# Patient Record
Sex: Female | Born: 1935 | Race: Black or African American | Hispanic: No | Smoking: Former smoker
Health system: Southern US, Community
[De-identification: ages and names within clinical notes are randomized; demographics above are authoritative.]

## PROBLEM LIST (undated history)

## (undated) DIAGNOSIS — K529 Noninfective gastroenteritis and colitis, unspecified: Secondary | ICD-10-CM

## (undated) DIAGNOSIS — C9030 Solitary plasmacytoma not having achieved remission: Secondary | ICD-10-CM

## (undated) DIAGNOSIS — E785 Hyperlipidemia, unspecified: Secondary | ICD-10-CM

## (undated) DIAGNOSIS — N183 Chronic kidney disease, stage 3 unspecified: Secondary | ICD-10-CM

## (undated) DIAGNOSIS — I10 Essential (primary) hypertension: Secondary | ICD-10-CM

## (undated) DIAGNOSIS — K219 Gastro-esophageal reflux disease without esophagitis: Secondary | ICD-10-CM

## (undated) DIAGNOSIS — C9000 Multiple myeloma not having achieved remission: Secondary | ICD-10-CM

## (undated) DIAGNOSIS — D472 Monoclonal gammopathy: Secondary | ICD-10-CM

## (undated) DIAGNOSIS — D649 Anemia, unspecified: Secondary | ICD-10-CM

## (undated) DIAGNOSIS — J189 Pneumonia, unspecified organism: Secondary | ICD-10-CM

## (undated) DIAGNOSIS — R350 Frequency of micturition: Secondary | ICD-10-CM

## (undated) DIAGNOSIS — M1990 Unspecified osteoarthritis, unspecified site: Secondary | ICD-10-CM

## (undated) HISTORY — DX: Monoclonal gammopathy: D47.2

## (undated) HISTORY — PX: BONE MARROW BIOPSY: SHX199

## (undated) HISTORY — DX: Multiple myeloma not having achieved remission: C90.00

## (undated) HISTORY — PX: FRACTURE SURGERY: SHX138

## (undated) HISTORY — PX: CATARACT EXTRACTION: SUR2

## (undated) HISTORY — PX: VAGINAL HYSTERECTOMY: SUR661

---

## 1997-12-30 ENCOUNTER — Other Ambulatory Visit: Admission: RE | Admit: 1997-12-30 | Discharge: 1997-12-30 | Payer: Self-pay | Admitting: Family Medicine

## 1998-01-02 ENCOUNTER — Ambulatory Visit (HOSPITAL_COMMUNITY): Admission: RE | Admit: 1998-01-02 | Discharge: 1998-01-02 | Payer: Self-pay | Admitting: Family Medicine

## 1998-01-27 ENCOUNTER — Ambulatory Visit (HOSPITAL_COMMUNITY): Admission: RE | Admit: 1998-01-27 | Discharge: 1998-01-27 | Payer: Self-pay | Admitting: Family Medicine

## 1998-10-24 ENCOUNTER — Ambulatory Visit (HOSPITAL_COMMUNITY): Admission: RE | Admit: 1998-10-24 | Discharge: 1998-10-24 | Payer: Self-pay | Admitting: Family Medicine

## 1998-10-24 ENCOUNTER — Encounter: Payer: Self-pay | Admitting: Family Medicine

## 1999-06-02 ENCOUNTER — Ambulatory Visit (HOSPITAL_COMMUNITY): Admission: RE | Admit: 1999-06-02 | Discharge: 1999-06-02 | Payer: Self-pay | Admitting: Family Medicine

## 1999-06-05 ENCOUNTER — Ambulatory Visit (HOSPITAL_COMMUNITY): Admission: RE | Admit: 1999-06-05 | Discharge: 1999-06-05 | Payer: Self-pay | Admitting: Family Medicine

## 1999-06-05 ENCOUNTER — Encounter: Payer: Self-pay | Admitting: Family Medicine

## 2000-05-14 ENCOUNTER — Encounter: Payer: Self-pay | Admitting: Emergency Medicine

## 2000-05-14 ENCOUNTER — Emergency Department (HOSPITAL_COMMUNITY): Admission: EM | Admit: 2000-05-14 | Discharge: 2000-05-14 | Payer: Self-pay | Admitting: Emergency Medicine

## 2000-12-19 ENCOUNTER — Other Ambulatory Visit: Admission: RE | Admit: 2000-12-19 | Discharge: 2000-12-19 | Payer: Self-pay | Admitting: Family Medicine

## 2000-12-23 ENCOUNTER — Ambulatory Visit (HOSPITAL_COMMUNITY): Admission: RE | Admit: 2000-12-23 | Discharge: 2000-12-23 | Payer: Self-pay | Admitting: Family Medicine

## 2000-12-23 ENCOUNTER — Encounter: Payer: Self-pay | Admitting: Family Medicine

## 2001-03-08 ENCOUNTER — Encounter: Payer: Self-pay | Admitting: Family Medicine

## 2001-03-08 ENCOUNTER — Ambulatory Visit (HOSPITAL_COMMUNITY): Admission: RE | Admit: 2001-03-08 | Discharge: 2001-03-09 | Payer: Self-pay | Admitting: Family Medicine

## 2001-03-09 ENCOUNTER — Encounter: Payer: Self-pay | Admitting: Family Medicine

## 2001-05-04 ENCOUNTER — Encounter: Payer: Self-pay | Admitting: Family Medicine

## 2001-05-04 ENCOUNTER — Ambulatory Visit (HOSPITAL_COMMUNITY): Admission: RE | Admit: 2001-05-04 | Discharge: 2001-05-04 | Payer: Self-pay | Admitting: Family Medicine

## 2002-07-04 ENCOUNTER — Emergency Department (HOSPITAL_COMMUNITY): Admission: EM | Admit: 2002-07-04 | Discharge: 2002-07-05 | Payer: Self-pay | Admitting: Emergency Medicine

## 2002-07-05 ENCOUNTER — Encounter: Payer: Self-pay | Admitting: *Deleted

## 2003-12-13 ENCOUNTER — Emergency Department (HOSPITAL_COMMUNITY): Admission: EM | Admit: 2003-12-13 | Discharge: 2003-12-14 | Payer: Self-pay | Admitting: Emergency Medicine

## 2003-12-15 ENCOUNTER — Emergency Department (HOSPITAL_COMMUNITY): Admission: EM | Admit: 2003-12-15 | Discharge: 2003-12-15 | Payer: Self-pay | Admitting: Emergency Medicine

## 2004-02-19 ENCOUNTER — Encounter (INDEPENDENT_AMBULATORY_CARE_PROVIDER_SITE_OTHER): Payer: Self-pay | Admitting: Cardiology

## 2004-02-19 ENCOUNTER — Inpatient Hospital Stay (HOSPITAL_COMMUNITY): Admission: EM | Admit: 2004-02-19 | Discharge: 2004-02-21 | Payer: Self-pay | Admitting: Emergency Medicine

## 2004-04-07 ENCOUNTER — Ambulatory Visit: Payer: Self-pay | Admitting: *Deleted

## 2004-04-22 ENCOUNTER — Ambulatory Visit: Payer: Self-pay | Admitting: Family Medicine

## 2004-04-23 ENCOUNTER — Ambulatory Visit: Payer: Self-pay | Admitting: Family Medicine

## 2004-08-14 ENCOUNTER — Ambulatory Visit: Payer: Self-pay | Admitting: Family Medicine

## 2004-09-21 ENCOUNTER — Ambulatory Visit: Payer: Self-pay | Admitting: Family Medicine

## 2004-09-24 ENCOUNTER — Ambulatory Visit: Payer: Self-pay | Admitting: Family Medicine

## 2005-03-29 ENCOUNTER — Emergency Department (HOSPITAL_COMMUNITY): Admission: EM | Admit: 2005-03-29 | Discharge: 2005-03-29 | Payer: Self-pay | Admitting: Family Medicine

## 2005-05-08 ENCOUNTER — Emergency Department (HOSPITAL_COMMUNITY): Admission: EM | Admit: 2005-05-08 | Discharge: 2005-05-08 | Payer: Self-pay | Admitting: Family Medicine

## 2005-06-11 ENCOUNTER — Emergency Department (HOSPITAL_COMMUNITY): Admission: EM | Admit: 2005-06-11 | Discharge: 2005-06-11 | Payer: Self-pay | Admitting: Family Medicine

## 2005-08-06 ENCOUNTER — Ambulatory Visit: Payer: Self-pay | Admitting: Internal Medicine

## 2005-09-06 ENCOUNTER — Ambulatory Visit: Payer: Self-pay | Admitting: Hospitalist

## 2005-10-01 ENCOUNTER — Ambulatory Visit: Payer: Self-pay | Admitting: Internal Medicine

## 2006-01-04 ENCOUNTER — Ambulatory Visit: Payer: Self-pay | Admitting: Cardiovascular Disease

## 2006-01-04 ENCOUNTER — Inpatient Hospital Stay (HOSPITAL_COMMUNITY): Admission: EM | Admit: 2006-01-04 | Discharge: 2006-01-06 | Payer: Self-pay | Admitting: Family Medicine

## 2006-01-05 ENCOUNTER — Encounter: Payer: Self-pay | Admitting: Cardiovascular Disease

## 2006-01-06 ENCOUNTER — Ambulatory Visit: Payer: Self-pay | Admitting: Internal Medicine

## 2006-01-25 ENCOUNTER — Ambulatory Visit: Payer: Self-pay | Admitting: Internal Medicine

## 2006-01-25 ENCOUNTER — Encounter (INDEPENDENT_AMBULATORY_CARE_PROVIDER_SITE_OTHER): Payer: Self-pay | Admitting: *Deleted

## 2006-01-30 LAB — FECAL OCCULT BLOOD, GUAIAC: Fecal Occult Blood: NEGATIVE

## 2006-02-08 ENCOUNTER — Ambulatory Visit: Payer: Self-pay | Admitting: Internal Medicine

## 2006-05-03 ENCOUNTER — Encounter (INDEPENDENT_AMBULATORY_CARE_PROVIDER_SITE_OTHER): Payer: Self-pay | Admitting: *Deleted

## 2006-05-04 DIAGNOSIS — K219 Gastro-esophageal reflux disease without esophagitis: Secondary | ICD-10-CM

## 2006-05-04 DIAGNOSIS — I10 Essential (primary) hypertension: Secondary | ICD-10-CM | POA: Insufficient documentation

## 2006-05-04 DIAGNOSIS — Z87891 Personal history of nicotine dependence: Secondary | ICD-10-CM

## 2006-06-07 DIAGNOSIS — J449 Chronic obstructive pulmonary disease, unspecified: Secondary | ICD-10-CM

## 2006-06-07 DIAGNOSIS — J4489 Other specified chronic obstructive pulmonary disease: Secondary | ICD-10-CM | POA: Insufficient documentation

## 2006-11-07 ENCOUNTER — Telehealth (INDEPENDENT_AMBULATORY_CARE_PROVIDER_SITE_OTHER): Payer: Self-pay | Admitting: *Deleted

## 2006-11-07 DIAGNOSIS — D7289 Other specified disorders of white blood cells: Secondary | ICD-10-CM

## 2006-11-21 ENCOUNTER — Ambulatory Visit (HOSPITAL_COMMUNITY): Admission: RE | Admit: 2006-11-21 | Discharge: 2006-11-21 | Payer: Self-pay | Admitting: Internal Medicine

## 2006-11-21 ENCOUNTER — Ambulatory Visit: Payer: Self-pay | Admitting: Internal Medicine

## 2006-11-21 ENCOUNTER — Encounter (INDEPENDENT_AMBULATORY_CARE_PROVIDER_SITE_OTHER): Payer: Self-pay | Admitting: *Deleted

## 2006-11-21 LAB — CONVERTED CEMR LAB
BUN: 18 mg/dL (ref 6–23)
Chloride: 97 meq/L (ref 96–112)
Glucose, Bld: 102 mg/dL — ABNORMAL HIGH (ref 70–99)
Ketones, ur: NEGATIVE mg/dL
LDL Cholesterol: 100 mg/dL — ABNORMAL HIGH (ref 0–99)
Nitrite: NEGATIVE
Potassium: 4.6 meq/L (ref 3.5–5.3)
Protein, ur: NEGATIVE mg/dL
Sodium: 134 meq/L — ABNORMAL LOW (ref 135–145)
Specific Gravity, Urine: 1.01 (ref 1.005–1.03)
Total CHOL/HDL Ratio: 2.8
Triglycerides: 89 mg/dL (ref <150)
Urobilinogen, UA: 0.2 (ref 0.0–1.0)
VLDL: 18 mg/dL (ref 0–40)

## 2006-11-28 ENCOUNTER — Telehealth (INDEPENDENT_AMBULATORY_CARE_PROVIDER_SITE_OTHER): Payer: Self-pay | Admitting: *Deleted

## 2007-02-06 ENCOUNTER — Telehealth (INDEPENDENT_AMBULATORY_CARE_PROVIDER_SITE_OTHER): Payer: Self-pay | Admitting: *Deleted

## 2007-03-21 ENCOUNTER — Encounter (INDEPENDENT_AMBULATORY_CARE_PROVIDER_SITE_OTHER): Payer: Self-pay | Admitting: *Deleted

## 2007-03-21 ENCOUNTER — Ambulatory Visit: Payer: Self-pay | Admitting: Hospitalist

## 2007-03-21 ENCOUNTER — Telehealth: Payer: Self-pay | Admitting: *Deleted

## 2007-03-21 LAB — CONVERTED CEMR LAB
BUN: 17 mg/dL (ref 6–23)
CO2: 23 meq/L (ref 19–32)
Glucose, Bld: 92 mg/dL (ref 70–99)
Potassium: 4.6 meq/L (ref 3.5–5.3)
Sodium: 133 meq/L — ABNORMAL LOW (ref 135–145)

## 2007-07-16 ENCOUNTER — Emergency Department (HOSPITAL_COMMUNITY): Admission: EM | Admit: 2007-07-16 | Discharge: 2007-07-16 | Payer: Self-pay | Admitting: Family Medicine

## 2007-07-20 ENCOUNTER — Encounter (INDEPENDENT_AMBULATORY_CARE_PROVIDER_SITE_OTHER): Payer: Self-pay | Admitting: *Deleted

## 2007-07-20 ENCOUNTER — Encounter (INDEPENDENT_AMBULATORY_CARE_PROVIDER_SITE_OTHER): Payer: Self-pay | Admitting: Infectious Diseases

## 2007-07-20 ENCOUNTER — Ambulatory Visit: Payer: Self-pay | Admitting: Internal Medicine

## 2007-07-20 DIAGNOSIS — M21619 Bunion of unspecified foot: Secondary | ICD-10-CM

## 2007-07-20 LAB — CONVERTED CEMR LAB
BUN: 16 mg/dL (ref 6–23)
CO2: 26 meq/L (ref 19–32)
Calcium: 9.8 mg/dL (ref 8.4–10.5)
Creatinine, Ser: 0.9 mg/dL (ref 0.40–1.20)
Glucose, Bld: 96 mg/dL (ref 70–99)
Sodium: 142 meq/L (ref 135–145)

## 2007-07-25 ENCOUNTER — Encounter (INDEPENDENT_AMBULATORY_CARE_PROVIDER_SITE_OTHER): Payer: Self-pay | Admitting: *Deleted

## 2007-07-28 ENCOUNTER — Ambulatory Visit: Payer: Self-pay | Admitting: Vascular Surgery

## 2007-07-28 ENCOUNTER — Encounter: Admission: RE | Admit: 2007-07-28 | Discharge: 2007-07-28 | Payer: Self-pay | Admitting: Gastroenterology

## 2007-08-17 ENCOUNTER — Encounter (INDEPENDENT_AMBULATORY_CARE_PROVIDER_SITE_OTHER): Payer: Self-pay | Admitting: *Deleted

## 2007-11-25 ENCOUNTER — Ambulatory Visit: Payer: Self-pay | Admitting: *Deleted

## 2007-11-26 ENCOUNTER — Ambulatory Visit: Payer: Self-pay | Admitting: Surgery

## 2007-11-26 ENCOUNTER — Inpatient Hospital Stay (HOSPITAL_COMMUNITY): Admission: EM | Admit: 2007-11-26 | Discharge: 2007-11-30 | Payer: Self-pay | Admitting: Emergency Medicine

## 2007-11-26 ENCOUNTER — Encounter (INDEPENDENT_AMBULATORY_CARE_PROVIDER_SITE_OTHER): Payer: Self-pay | Admitting: Hospitalist

## 2007-11-26 ENCOUNTER — Ambulatory Visit: Payer: Self-pay | Admitting: Hospitalist

## 2007-11-26 ENCOUNTER — Encounter (INDEPENDENT_AMBULATORY_CARE_PROVIDER_SITE_OTHER): Payer: Self-pay | Admitting: Internal Medicine

## 2007-11-27 ENCOUNTER — Encounter: Payer: Self-pay | Admitting: Cardiovascular Disease

## 2007-12-07 ENCOUNTER — Encounter (INDEPENDENT_AMBULATORY_CARE_PROVIDER_SITE_OTHER): Payer: Self-pay | Admitting: Internal Medicine

## 2007-12-07 ENCOUNTER — Ambulatory Visit (HOSPITAL_COMMUNITY): Admission: RE | Admit: 2007-12-07 | Discharge: 2007-12-07 | Payer: Self-pay | Admitting: Internal Medicine

## 2007-12-07 ENCOUNTER — Ambulatory Visit: Payer: Self-pay | Admitting: Internal Medicine

## 2007-12-07 DIAGNOSIS — I498 Other specified cardiac arrhythmias: Secondary | ICD-10-CM

## 2007-12-14 ENCOUNTER — Ambulatory Visit: Payer: Self-pay | Admitting: Internal Medicine

## 2007-12-14 DIAGNOSIS — G4700 Insomnia, unspecified: Secondary | ICD-10-CM

## 2007-12-31 ENCOUNTER — Encounter (INDEPENDENT_AMBULATORY_CARE_PROVIDER_SITE_OTHER): Payer: Self-pay | Admitting: Internal Medicine

## 2007-12-31 ENCOUNTER — Inpatient Hospital Stay (HOSPITAL_COMMUNITY): Admission: EM | Admit: 2007-12-31 | Discharge: 2008-01-01 | Payer: Self-pay | Admitting: Emergency Medicine

## 2007-12-31 ENCOUNTER — Ambulatory Visit: Payer: Self-pay | Admitting: Internal Medicine

## 2008-01-02 ENCOUNTER — Telehealth (INDEPENDENT_AMBULATORY_CARE_PROVIDER_SITE_OTHER): Payer: Self-pay | Admitting: Internal Medicine

## 2008-01-10 ENCOUNTER — Ambulatory Visit: Payer: Self-pay | Admitting: Infectious Diseases

## 2008-01-10 ENCOUNTER — Encounter (INDEPENDENT_AMBULATORY_CARE_PROVIDER_SITE_OTHER): Payer: Self-pay | Admitting: Internal Medicine

## 2008-01-15 ENCOUNTER — Encounter (INDEPENDENT_AMBULATORY_CARE_PROVIDER_SITE_OTHER): Payer: Self-pay | Admitting: Internal Medicine

## 2008-01-15 LAB — CONVERTED CEMR LAB
BUN: 12 mg/dL (ref 6–23)
Calcium: 9.5 mg/dL (ref 8.4–10.5)
Chloride: 93 meq/L — ABNORMAL LOW (ref 96–112)
Creatinine, Ser: 0.93 mg/dL (ref 0.40–1.20)
HCT: 35.8 % — ABNORMAL LOW (ref 36.0–46.0)
Hemoglobin: 12 g/dL (ref 12.0–15.0)
MCV: 90.6 fL (ref 78.0–100.0)
Platelets: 272 10*3/uL (ref 150–400)
RDW: 13.2 % (ref 11.5–15.5)

## 2008-01-18 ENCOUNTER — Ambulatory Visit: Payer: Self-pay | Admitting: Internal Medicine

## 2008-01-18 ENCOUNTER — Encounter (INDEPENDENT_AMBULATORY_CARE_PROVIDER_SITE_OTHER): Payer: Self-pay | Admitting: Internal Medicine

## 2008-01-18 LAB — CONVERTED CEMR LAB
BUN: 16 mg/dL (ref 6–23)
Chloride: 97 meq/L (ref 96–112)
Creatinine, Ser: 0.94 mg/dL (ref 0.40–1.20)
Potassium: 4.2 meq/L (ref 3.5–5.3)

## 2008-01-20 HISTORY — PX: FEMUR SURGERY: SHX943

## 2008-01-25 ENCOUNTER — Telehealth: Payer: Self-pay | Admitting: *Deleted

## 2008-01-25 ENCOUNTER — Encounter (INDEPENDENT_AMBULATORY_CARE_PROVIDER_SITE_OTHER): Payer: Self-pay | Admitting: Internal Medicine

## 2008-02-09 ENCOUNTER — Ambulatory Visit: Payer: Self-pay | Admitting: Cardiology

## 2008-02-09 ENCOUNTER — Observation Stay (HOSPITAL_COMMUNITY): Admission: AD | Admit: 2008-02-09 | Discharge: 2008-02-10 | Payer: Self-pay | Admitting: Infectious Diseases

## 2008-02-09 ENCOUNTER — Ambulatory Visit (HOSPITAL_COMMUNITY): Admission: RE | Admit: 2008-02-09 | Discharge: 2008-02-09 | Payer: Self-pay | Admitting: Internal Medicine

## 2008-02-09 ENCOUNTER — Encounter (INDEPENDENT_AMBULATORY_CARE_PROVIDER_SITE_OTHER): Payer: Self-pay | Admitting: Internal Medicine

## 2008-02-09 ENCOUNTER — Ambulatory Visit: Payer: Self-pay | Admitting: Internal Medicine

## 2008-02-10 ENCOUNTER — Ambulatory Visit: Payer: Self-pay | Admitting: Infectious Diseases

## 2008-02-29 ENCOUNTER — Ambulatory Visit: Payer: Self-pay | Admitting: Cardiology

## 2008-03-06 ENCOUNTER — Telehealth (INDEPENDENT_AMBULATORY_CARE_PROVIDER_SITE_OTHER): Payer: Self-pay | Admitting: Internal Medicine

## 2008-03-14 ENCOUNTER — Ambulatory Visit: Payer: Self-pay | Admitting: Internal Medicine

## 2008-03-20 ENCOUNTER — Ambulatory Visit (HOSPITAL_COMMUNITY): Admission: RE | Admit: 2008-03-20 | Discharge: 2008-03-20 | Payer: Self-pay | Admitting: *Deleted

## 2008-05-08 ENCOUNTER — Ambulatory Visit: Payer: Self-pay | Admitting: Cardiology

## 2008-05-29 ENCOUNTER — Encounter (INDEPENDENT_AMBULATORY_CARE_PROVIDER_SITE_OTHER): Payer: Self-pay | Admitting: Internal Medicine

## 2008-05-29 ENCOUNTER — Ambulatory Visit: Payer: Self-pay | Admitting: Internal Medicine

## 2008-05-29 DIAGNOSIS — E785 Hyperlipidemia, unspecified: Secondary | ICD-10-CM

## 2008-05-29 LAB — CONVERTED CEMR LAB
Calcium: 9.9 mg/dL (ref 8.4–10.5)
Creatinine, Ser: 0.93 mg/dL (ref 0.40–1.20)
Sodium: 143 meq/L (ref 135–145)

## 2008-05-31 ENCOUNTER — Ambulatory Visit (HOSPITAL_COMMUNITY): Admission: RE | Admit: 2008-05-31 | Discharge: 2008-05-31 | Payer: Self-pay | Admitting: Internal Medicine

## 2008-05-31 ENCOUNTER — Encounter (INDEPENDENT_AMBULATORY_CARE_PROVIDER_SITE_OTHER): Payer: Self-pay | Admitting: Internal Medicine

## 2008-06-19 ENCOUNTER — Encounter (INDEPENDENT_AMBULATORY_CARE_PROVIDER_SITE_OTHER): Payer: Self-pay | Admitting: *Deleted

## 2008-06-19 ENCOUNTER — Ambulatory Visit: Payer: Self-pay | Admitting: Infectious Diseases

## 2008-06-19 LAB — CONVERTED CEMR LAB
CO2: 25 meq/L (ref 19–32)
Chloride: 107 meq/L (ref 96–112)
Potassium: 4.6 meq/L (ref 3.5–5.3)
Sodium: 144 meq/L (ref 135–145)

## 2008-06-28 ENCOUNTER — Ambulatory Visit: Payer: Self-pay

## 2008-06-28 ENCOUNTER — Encounter: Payer: Self-pay | Admitting: Cardiology

## 2008-07-01 ENCOUNTER — Telehealth (INDEPENDENT_AMBULATORY_CARE_PROVIDER_SITE_OTHER): Payer: Self-pay | Admitting: Internal Medicine

## 2008-07-09 ENCOUNTER — Encounter: Payer: Self-pay | Admitting: Licensed Clinical Social Worker

## 2008-07-09 ENCOUNTER — Encounter (INDEPENDENT_AMBULATORY_CARE_PROVIDER_SITE_OTHER): Payer: Self-pay | Admitting: Internal Medicine

## 2008-07-09 ENCOUNTER — Ambulatory Visit: Payer: Self-pay | Admitting: Infectious Disease

## 2008-07-09 DIAGNOSIS — F329 Major depressive disorder, single episode, unspecified: Secondary | ICD-10-CM

## 2008-07-12 ENCOUNTER — Encounter (INDEPENDENT_AMBULATORY_CARE_PROVIDER_SITE_OTHER): Payer: Self-pay | Admitting: Internal Medicine

## 2008-07-12 ENCOUNTER — Telehealth: Payer: Self-pay | Admitting: Licensed Clinical Social Worker

## 2008-07-17 DIAGNOSIS — R748 Abnormal levels of other serum enzymes: Secondary | ICD-10-CM | POA: Insufficient documentation

## 2008-07-17 DIAGNOSIS — R7309 Other abnormal glucose: Secondary | ICD-10-CM

## 2008-07-17 LAB — CONVERTED CEMR LAB
Albumin: 4.4 g/dL (ref 3.5–5.2)
Alkaline Phosphatase: 125 units/L — ABNORMAL HIGH (ref 39–117)
CO2: 24 meq/L (ref 19–32)
Calcium: 9.9 mg/dL (ref 8.4–10.5)
Chloride: 105 meq/L (ref 96–112)
Cholesterol: 154 mg/dL (ref 0–200)
Glucose, Bld: 109 mg/dL — ABNORMAL HIGH (ref 70–99)
LDL Cholesterol: 70 mg/dL (ref 0–99)
Potassium: 4.4 meq/L (ref 3.5–5.3)
Sodium: 143 meq/L (ref 135–145)
Total Protein: 7.2 g/dL (ref 6.0–8.3)
Triglycerides: 87 mg/dL (ref <150)

## 2008-09-19 ENCOUNTER — Ambulatory Visit: Payer: Self-pay | Admitting: Internal Medicine

## 2008-10-24 ENCOUNTER — Ambulatory Visit: Payer: Self-pay | Admitting: Cardiology

## 2008-10-24 DIAGNOSIS — R609 Edema, unspecified: Secondary | ICD-10-CM | POA: Insufficient documentation

## 2008-10-24 DIAGNOSIS — I2109 ST elevation (STEMI) myocardial infarction involving other coronary artery of anterior wall: Secondary | ICD-10-CM

## 2008-10-28 ENCOUNTER — Telehealth: Payer: Self-pay | Admitting: Cardiology

## 2009-01-24 ENCOUNTER — Encounter: Payer: Self-pay | Admitting: Internal Medicine

## 2009-01-24 ENCOUNTER — Inpatient Hospital Stay (HOSPITAL_COMMUNITY): Admission: EM | Admit: 2009-01-24 | Discharge: 2009-01-27 | Payer: Self-pay | Admitting: Emergency Medicine

## 2009-01-24 ENCOUNTER — Emergency Department (HOSPITAL_COMMUNITY): Admission: EM | Admit: 2009-01-24 | Discharge: 2009-01-24 | Payer: Self-pay | Admitting: Family Medicine

## 2009-01-24 ENCOUNTER — Ambulatory Visit: Payer: Self-pay | Admitting: Infectious Disease

## 2009-01-27 ENCOUNTER — Telehealth (INDEPENDENT_AMBULATORY_CARE_PROVIDER_SITE_OTHER): Payer: Self-pay | Admitting: Internal Medicine

## 2009-02-11 ENCOUNTER — Ambulatory Visit: Payer: Self-pay | Admitting: Oncology

## 2009-02-14 ENCOUNTER — Encounter (INDEPENDENT_AMBULATORY_CARE_PROVIDER_SITE_OTHER): Payer: Self-pay | Admitting: Internal Medicine

## 2009-02-20 ENCOUNTER — Encounter (INDEPENDENT_AMBULATORY_CARE_PROVIDER_SITE_OTHER): Payer: Self-pay | Admitting: Internal Medicine

## 2009-03-11 ENCOUNTER — Encounter (INDEPENDENT_AMBULATORY_CARE_PROVIDER_SITE_OTHER): Payer: Self-pay | Admitting: Internal Medicine

## 2009-03-11 ENCOUNTER — Encounter: Payer: Self-pay | Admitting: Cardiology

## 2009-03-11 LAB — CBC WITH DIFFERENTIAL/PLATELET
Basophils Absolute: 0 10*3/uL (ref 0.0–0.1)
EOS%: 0.7 % (ref 0.0–7.0)
Eosinophils Absolute: 0.1 10*3/uL (ref 0.0–0.5)
LYMPH%: 14.6 % (ref 14.0–49.7)
MCH: 31.4 pg (ref 25.1–34.0)
MCV: 91.6 fL (ref 79.5–101.0)
MONO%: 5.1 % (ref 0.0–14.0)
NEUT#: 6.4 10*3/uL (ref 1.5–6.5)
Platelets: 232 10*3/uL (ref 145–400)
RBC: 3.87 10*6/uL (ref 3.70–5.45)

## 2009-03-13 ENCOUNTER — Ambulatory Visit (HOSPITAL_COMMUNITY): Admission: RE | Admit: 2009-03-13 | Discharge: 2009-03-13 | Payer: Self-pay | Admitting: Oncology

## 2009-03-13 ENCOUNTER — Ambulatory Visit: Payer: Self-pay | Admitting: Oncology

## 2009-03-14 LAB — COMPREHENSIVE METABOLIC PANEL
ALT: 10 U/L (ref 0–35)
Albumin: 4.3 g/dL (ref 3.5–5.2)
Alkaline Phosphatase: 70 U/L (ref 39–117)
CO2: 21 mEq/L (ref 19–32)
Glucose, Bld: 78 mg/dL (ref 70–99)
Potassium: 4.4 mEq/L (ref 3.5–5.3)
Sodium: 133 mEq/L — ABNORMAL LOW (ref 135–145)
Total Bilirubin: 0.7 mg/dL (ref 0.3–1.2)
Total Protein: 6.7 g/dL (ref 6.0–8.3)

## 2009-03-14 LAB — SPEP & IFE WITH QIG
Albumin ELP: 60 % (ref 55.8–66.1)
IgA: 113 mg/dL (ref 68–378)
IgG (Immunoglobin G), Serum: 733 mg/dL (ref 694–1618)
M-Spike, %: 0.35 g/dL
Total Protein, Serum Electrophoresis: 6.7 g/dL (ref 6.0–8.3)

## 2009-03-14 LAB — KAPPA/LAMBDA LIGHT CHAINS
Kappa free light chain: 12.2 mg/dL — ABNORMAL HIGH (ref 0.33–1.94)
Lambda Free Lght Chn: 1.22 mg/dL (ref 0.57–2.63)

## 2009-03-17 LAB — UIFE/LIGHT CHAINS/TP QN, 24-HR UR
Free Kappa Lt Chains,Ur: 1.48 mg/dL (ref 0.04–1.51)
Free Kappa/Lambda Ratio: 24.67 ratio — ABNORMAL HIGH (ref 0.46–4.00)
Free Lambda Excretion/Day: 0.83 mg/d
Free Lambda Lt Chains,Ur: 0.06 mg/dL (ref 0.08–1.01)
Free Lt Chn Excr Rate: 20.35 mg/d
Gamma Globulin, Urine: DETECTED — AB
Time: 24 hours
Total Protein, Urine: 2 mg/dL
Volume, Urine: 1375 mL

## 2009-03-20 ENCOUNTER — Ambulatory Visit: Admission: RE | Admit: 2009-03-20 | Discharge: 2009-05-22 | Payer: Self-pay | Admitting: Radiation Oncology

## 2009-03-21 ENCOUNTER — Emergency Department (HOSPITAL_COMMUNITY): Admission: EM | Admit: 2009-03-21 | Discharge: 2009-03-21 | Payer: Self-pay | Admitting: Emergency Medicine

## 2009-03-27 ENCOUNTER — Ambulatory Visit: Payer: Self-pay | Admitting: Internal Medicine

## 2009-03-27 ENCOUNTER — Ambulatory Visit: Payer: Self-pay | Admitting: Infectious Diseases

## 2009-03-27 ENCOUNTER — Telehealth (INDEPENDENT_AMBULATORY_CARE_PROVIDER_SITE_OTHER): Payer: Self-pay | Admitting: Internal Medicine

## 2009-03-27 ENCOUNTER — Observation Stay (HOSPITAL_COMMUNITY): Admission: AD | Admit: 2009-03-27 | Discharge: 2009-03-29 | Payer: Self-pay | Admitting: Internal Medicine

## 2009-03-27 ENCOUNTER — Ambulatory Visit (HOSPITAL_COMMUNITY): Admission: RE | Admit: 2009-03-27 | Discharge: 2009-03-27 | Payer: Self-pay | Admitting: Internal Medicine

## 2009-03-27 ENCOUNTER — Encounter (INDEPENDENT_AMBULATORY_CARE_PROVIDER_SITE_OTHER): Payer: Self-pay | Admitting: Internal Medicine

## 2009-03-27 DIAGNOSIS — M25559 Pain in unspecified hip: Secondary | ICD-10-CM | POA: Insufficient documentation

## 2009-03-27 DIAGNOSIS — C9000 Multiple myeloma not having achieved remission: Secondary | ICD-10-CM

## 2009-03-29 ENCOUNTER — Encounter: Payer: Self-pay | Admitting: Internal Medicine

## 2009-03-31 ENCOUNTER — Ambulatory Visit: Payer: Self-pay | Admitting: Internal Medicine

## 2009-04-07 ENCOUNTER — Encounter: Payer: Self-pay | Admitting: Oncology

## 2009-04-07 ENCOUNTER — Ambulatory Visit (HOSPITAL_COMMUNITY): Admission: RE | Admit: 2009-04-07 | Discharge: 2009-04-07 | Payer: Self-pay | Admitting: Oncology

## 2009-04-07 ENCOUNTER — Ambulatory Visit: Payer: Self-pay | Admitting: Oncology

## 2009-04-11 ENCOUNTER — Encounter: Payer: Self-pay | Admitting: Cardiology

## 2009-04-14 ENCOUNTER — Encounter: Payer: Self-pay | Admitting: Cardiology

## 2009-05-07 ENCOUNTER — Ambulatory Visit: Payer: Self-pay | Admitting: Oncology

## 2009-05-07 ENCOUNTER — Encounter: Payer: Self-pay | Admitting: Cardiology

## 2009-05-07 LAB — COMPREHENSIVE METABOLIC PANEL
AST: 18 U/L (ref 0–37)
Alkaline Phosphatase: 69 U/L (ref 39–117)
Glucose, Bld: 112 mg/dL — ABNORMAL HIGH (ref 70–99)
Sodium: 136 mEq/L (ref 135–145)
Total Bilirubin: 0.6 mg/dL (ref 0.3–1.2)
Total Protein: 6.6 g/dL (ref 6.0–8.3)

## 2009-05-07 LAB — CBC WITH DIFFERENTIAL/PLATELET
BASO%: 0.5 % (ref 0.0–2.0)
EOS%: 1.2 % (ref 0.0–7.0)
Eosinophils Absolute: 0.1 10*3/uL (ref 0.0–0.5)
LYMPH%: 21.3 % (ref 14.0–49.7)
MCH: 32 pg (ref 25.1–34.0)
MCHC: 33.7 g/dL (ref 31.5–36.0)
MCV: 94.9 fL (ref 79.5–101.0)
MONO%: 5.9 % (ref 0.0–14.0)
Platelets: 211 10*3/uL (ref 145–400)
RBC: 3.64 10*6/uL — ABNORMAL LOW (ref 3.70–5.45)
RDW: 13.6 % (ref 11.2–14.5)

## 2009-05-19 ENCOUNTER — Encounter: Payer: Self-pay | Admitting: Cardiology

## 2009-06-18 ENCOUNTER — Encounter (INDEPENDENT_AMBULATORY_CARE_PROVIDER_SITE_OTHER): Payer: Self-pay | Admitting: Internal Medicine

## 2009-07-02 ENCOUNTER — Ambulatory Visit: Payer: Self-pay | Admitting: Oncology

## 2009-07-04 ENCOUNTER — Encounter (INDEPENDENT_AMBULATORY_CARE_PROVIDER_SITE_OTHER): Payer: Self-pay | Admitting: Internal Medicine

## 2009-07-04 ENCOUNTER — Encounter: Payer: Self-pay | Admitting: Cardiology

## 2009-07-04 LAB — CBC WITH DIFFERENTIAL/PLATELET
EOS%: 1.5 % (ref 0.0–7.0)
MCH: 31.2 pg (ref 25.1–34.0)
MCV: 95.2 fL (ref 79.5–101.0)
MONO%: 7.1 % (ref 0.0–14.0)
RBC: 3.72 10*6/uL (ref 3.70–5.45)
RDW: 12.5 % (ref 11.2–14.5)

## 2009-07-04 LAB — COMPREHENSIVE METABOLIC PANEL
AST: 28 U/L (ref 0–37)
Albumin: 3.9 g/dL (ref 3.5–5.2)
Alkaline Phosphatase: 87 U/L (ref 39–117)
BUN: 13 mg/dL (ref 6–23)
Potassium: 3.4 mEq/L — ABNORMAL LOW (ref 3.5–5.3)
Sodium: 139 mEq/L (ref 135–145)
Total Protein: 6.6 g/dL (ref 6.0–8.3)

## 2009-07-08 LAB — KAPPA/LAMBDA LIGHT CHAINS
Kappa free light chain: 12.4 mg/dL — ABNORMAL HIGH (ref 0.33–1.94)
Lambda Free Lght Chn: 0.92 mg/dL (ref 0.57–2.63)

## 2009-07-08 LAB — IMMUNOFIXATION ELECTROPHORESIS
IgA: 117 mg/dL (ref 68–378)
IgM, Serum: 30 mg/dL — ABNORMAL LOW (ref 60–263)

## 2009-07-14 ENCOUNTER — Telehealth (INDEPENDENT_AMBULATORY_CARE_PROVIDER_SITE_OTHER): Payer: Self-pay | Admitting: Internal Medicine

## 2009-07-17 ENCOUNTER — Ambulatory Visit (HOSPITAL_COMMUNITY): Admission: RE | Admit: 2009-07-17 | Discharge: 2009-07-17 | Payer: Self-pay | Admitting: Oncology

## 2009-08-05 ENCOUNTER — Ambulatory Visit: Payer: Self-pay | Admitting: Oncology

## 2009-08-07 ENCOUNTER — Encounter (INDEPENDENT_AMBULATORY_CARE_PROVIDER_SITE_OTHER): Payer: Self-pay | Admitting: Internal Medicine

## 2009-08-07 ENCOUNTER — Encounter: Payer: Self-pay | Admitting: Cardiology

## 2009-08-07 LAB — CBC WITH DIFFERENTIAL/PLATELET
Basophils Absolute: 0 10*3/uL (ref 0.0–0.1)
EOS%: 3.3 % (ref 0.0–7.0)
Eosinophils Absolute: 0.2 10*3/uL (ref 0.0–0.5)
HGB: 10.6 g/dL — ABNORMAL LOW (ref 11.6–15.9)
MCH: 31.1 pg (ref 25.1–34.0)
MONO#: 0.4 10*3/uL (ref 0.1–0.9)
NEUT#: 3.5 10*3/uL (ref 1.5–6.5)
RDW: 12.5 % (ref 11.2–14.5)
WBC: 5.1 10*3/uL (ref 3.9–10.3)
lymph#: 1 10*3/uL (ref 0.9–3.3)

## 2009-08-07 LAB — COMPREHENSIVE METABOLIC PANEL
AST: 23 U/L (ref 0–37)
Albumin: 3.6 g/dL (ref 3.5–5.2)
BUN: 14 mg/dL (ref 6–23)
CO2: 28 mEq/L (ref 19–32)
Calcium: 8.4 mg/dL (ref 8.4–10.5)
Chloride: 107 mEq/L (ref 96–112)
Creatinine, Ser: 0.86 mg/dL (ref 0.40–1.20)
Glucose, Bld: 89 mg/dL (ref 70–99)

## 2009-09-02 ENCOUNTER — Encounter (INDEPENDENT_AMBULATORY_CARE_PROVIDER_SITE_OTHER): Payer: Self-pay | Admitting: Internal Medicine

## 2009-09-02 ENCOUNTER — Encounter: Payer: Self-pay | Admitting: Cardiology

## 2009-09-02 LAB — CBC WITH DIFFERENTIAL/PLATELET
BASO%: 0.6 % (ref 0.0–2.0)
Eosinophils Absolute: 0.1 10*3/uL (ref 0.0–0.5)
LYMPH%: 21.1 % (ref 14.0–49.7)
MCHC: 34 g/dL (ref 31.5–36.0)
MONO#: 0.4 10*3/uL (ref 0.1–0.9)
MONO%: 8.9 % (ref 0.0–14.0)
NEUT#: 3 10*3/uL (ref 1.5–6.5)
Platelets: 204 10*3/uL (ref 145–400)
RBC: 3.61 10*6/uL — ABNORMAL LOW (ref 3.70–5.45)
RDW: 13.1 % (ref 11.2–14.5)
WBC: 4.5 10*3/uL (ref 3.9–10.3)

## 2009-09-02 LAB — COMPREHENSIVE METABOLIC PANEL
ALT: 23 U/L (ref 0–35)
Albumin: 3.5 g/dL (ref 3.5–5.2)
Alkaline Phosphatase: 120 U/L — ABNORMAL HIGH (ref 39–117)
CO2: 28 mEq/L (ref 19–32)
Potassium: 3.8 mEq/L (ref 3.5–5.3)
Sodium: 144 mEq/L (ref 135–145)
Total Bilirubin: 0.7 mg/dL (ref 0.3–1.2)
Total Protein: 6.3 g/dL (ref 6.0–8.3)

## 2009-09-03 ENCOUNTER — Encounter (INDEPENDENT_AMBULATORY_CARE_PROVIDER_SITE_OTHER): Payer: Self-pay | Admitting: Internal Medicine

## 2009-09-03 ENCOUNTER — Ambulatory Visit: Payer: Self-pay | Admitting: Infectious Diseases

## 2009-09-03 LAB — CONVERTED CEMR LAB
AST: 19 units/L (ref 0–37)
Albumin: 3.9 g/dL (ref 3.5–5.2)
BUN: 18 mg/dL (ref 6–23)
CO2: 26 meq/L (ref 19–32)
Calcium: 9 mg/dL (ref 8.4–10.5)
Chloride: 106 meq/L (ref 96–112)
HDL: 54 mg/dL (ref >39)
Potassium: 4.7 meq/L (ref 3.5–5.3)

## 2009-09-05 LAB — SPEP & IFE WITH QIG
Albumin ELP: 57.7 % (ref 55.8–66.1)
Alpha-1-Globulin: 6.6 % — ABNORMAL HIGH (ref 2.9–4.9)
IgA: 117 mg/dL (ref 68–378)
IgM, Serum: 28 mg/dL — ABNORMAL LOW (ref 60–263)
Total Protein, Serum Electrophoresis: 6.4 g/dL (ref 6.0–8.3)

## 2009-09-22 ENCOUNTER — Ambulatory Visit (HOSPITAL_COMMUNITY): Admission: RE | Admit: 2009-09-22 | Discharge: 2009-09-22 | Payer: Self-pay | Admitting: Internal Medicine

## 2009-09-22 LAB — HM MAMMOGRAPHY: HM Mammogram: NEGATIVE

## 2009-09-30 ENCOUNTER — Ambulatory Visit: Payer: Self-pay | Admitting: Oncology

## 2009-10-02 ENCOUNTER — Encounter (INDEPENDENT_AMBULATORY_CARE_PROVIDER_SITE_OTHER): Payer: Self-pay | Admitting: Internal Medicine

## 2009-10-02 LAB — CBC WITH DIFFERENTIAL/PLATELET
BASO%: 0.3 % (ref 0.0–2.0)
EOS%: 4.2 % (ref 0.0–7.0)
LYMPH%: 28.3 % (ref 14.0–49.7)
MCH: 30.3 pg (ref 25.1–34.0)
MCHC: 32.6 g/dL (ref 31.5–36.0)
MONO#: 0.3 10*3/uL (ref 0.1–0.9)
MONO%: 7.9 % (ref 0.0–14.0)
NEUT%: 59.3 % (ref 38.4–76.8)
Platelets: 164 10*3/uL (ref 145–400)
RBC: 3.76 10*6/uL (ref 3.70–5.45)
WBC: 3.8 10*3/uL — ABNORMAL LOW (ref 3.9–10.3)
nRBC: 0 % (ref 0–0)

## 2009-10-07 LAB — SPEP & IFE WITH QIG
Alpha-2-Globulin: 12 % — ABNORMAL HIGH (ref 7.1–11.8)
Gamma Globulin: 13.6 % (ref 11.1–18.8)
IgA: 111 mg/dL (ref 68–378)
IgG (Immunoglobin G), Serum: 753 mg/dL (ref 694–1618)
IgM, Serum: 27 mg/dL — ABNORMAL LOW (ref 60–263)
M-Spike, %: 0.33 g/dL
Total Protein, Serum Electrophoresis: 6.2 g/dL (ref 6.0–8.3)

## 2009-10-07 LAB — COMPREHENSIVE METABOLIC PANEL
ALT: 18 U/L (ref 0–35)
AST: 16 U/L (ref 0–37)
Alkaline Phosphatase: 105 U/L (ref 39–117)
BUN: 13 mg/dL (ref 6–23)
Chloride: 106 mEq/L (ref 96–112)
Creatinine, Ser: 0.93 mg/dL (ref 0.40–1.20)
Total Bilirubin: 0.8 mg/dL (ref 0.3–1.2)

## 2009-10-07 LAB — KAPPA/LAMBDA LIGHT CHAINS: Kappa:Lambda Ratio: 30.66 — ABNORMAL HIGH (ref 0.26–1.65)

## 2009-11-06 ENCOUNTER — Ambulatory Visit: Payer: Self-pay | Admitting: Oncology

## 2009-11-12 ENCOUNTER — Encounter (INDEPENDENT_AMBULATORY_CARE_PROVIDER_SITE_OTHER): Payer: Self-pay | Admitting: Internal Medicine

## 2009-11-12 LAB — COMPREHENSIVE METABOLIC PANEL
AST: 16 U/L (ref 0–37)
Alkaline Phosphatase: 104 U/L (ref 39–117)
CO2: 27 mEq/L (ref 19–32)
Chloride: 108 mEq/L (ref 96–112)
Creatinine, Ser: 0.97 mg/dL (ref 0.40–1.20)
Total Bilirubin: 1 mg/dL (ref 0.3–1.2)

## 2009-11-12 LAB — CBC WITH DIFFERENTIAL/PLATELET
BASO%: 1.1 % (ref 0.0–2.0)
HCT: 32.9 % — ABNORMAL LOW (ref 34.8–46.6)
HGB: 11.2 g/dL — ABNORMAL LOW (ref 11.6–15.9)
MCHC: 34 g/dL (ref 31.5–36.0)
MONO#: 0.2 10*3/uL (ref 0.1–0.9)
NEUT%: 66.4 % (ref 38.4–76.8)
WBC: 3.7 10*3/uL — ABNORMAL LOW (ref 3.9–10.3)
lymph#: 0.7 10*3/uL — ABNORMAL LOW (ref 0.9–3.3)

## 2009-11-14 LAB — KAPPA/LAMBDA LIGHT CHAINS: Lambda Free Lght Chn: 0.53 mg/dL — ABNORMAL LOW (ref 0.57–2.63)

## 2009-11-14 LAB — SPEP & IFE WITH QIG
Alpha-2-Globulin: 15 % — ABNORMAL HIGH (ref 7.1–11.8)
Beta 2: 5.8 % (ref 3.2–6.5)
Gamma Globulin: 13.2 % (ref 11.1–18.8)
IgG (Immunoglobin G), Serum: 751 mg/dL (ref 694–1618)
IgM, Serum: 27 mg/dL — ABNORMAL LOW (ref 60–263)

## 2009-12-09 ENCOUNTER — Telehealth: Payer: Self-pay | Admitting: Internal Medicine

## 2009-12-15 ENCOUNTER — Telehealth: Payer: Self-pay | Admitting: Internal Medicine

## 2009-12-23 ENCOUNTER — Ambulatory Visit: Payer: Self-pay | Admitting: Oncology

## 2009-12-30 ENCOUNTER — Ambulatory Visit (HOSPITAL_COMMUNITY): Admission: RE | Admit: 2009-12-30 | Discharge: 2009-12-30 | Payer: Self-pay | Admitting: Oncology

## 2010-01-01 ENCOUNTER — Encounter: Payer: Self-pay | Admitting: Internal Medicine

## 2010-01-01 LAB — CBC WITH DIFFERENTIAL/PLATELET
EOS%: 5.1 % (ref 0.0–7.0)
Eosinophils Absolute: 0.2 10*3/uL (ref 0.0–0.5)
MCH: 30.6 pg (ref 25.1–34.0)
MCV: 94.6 fL (ref 79.5–101.0)
MONO%: 8.1 % (ref 0.0–14.0)
NEUT#: 1.8 10*3/uL (ref 1.5–6.5)
RBC: 3.66 10*6/uL — ABNORMAL LOW (ref 3.70–5.45)
RDW: 15.6 % — ABNORMAL HIGH (ref 11.2–14.5)
lymph#: 0.9 10*3/uL (ref 0.9–3.3)

## 2010-01-05 LAB — COMPREHENSIVE METABOLIC PANEL
ALT: 18 U/L (ref 0–35)
AST: 15 U/L (ref 0–37)
Albumin: 4.1 g/dL (ref 3.5–5.2)
Alkaline Phosphatase: 89 U/L (ref 39–117)
Potassium: 4 mEq/L (ref 3.5–5.3)
Sodium: 141 mEq/L (ref 135–145)
Total Protein: 6.3 g/dL (ref 6.0–8.3)

## 2010-01-05 LAB — SPEP & IFE WITH QIG
Albumin ELP: 60.1 % (ref 55.8–66.1)
Alpha-1-Globulin: 5.6 % — ABNORMAL HIGH (ref 2.9–4.9)
Gamma Globulin: 13.1 % (ref 11.1–18.8)
IgM, Serum: 25 mg/dL — ABNORMAL LOW (ref 60–263)
Total Protein, Serum Electrophoresis: 6.3 g/dL (ref 6.0–8.3)

## 2010-01-13 ENCOUNTER — Ambulatory Visit: Payer: Self-pay | Admitting: Internal Medicine

## 2010-01-13 DIAGNOSIS — R197 Diarrhea, unspecified: Secondary | ICD-10-CM

## 2010-02-16 ENCOUNTER — Ambulatory Visit: Payer: Self-pay | Admitting: Oncology

## 2010-02-16 ENCOUNTER — Encounter: Payer: Self-pay | Admitting: Emergency Medicine

## 2010-02-16 ENCOUNTER — Encounter (INDEPENDENT_AMBULATORY_CARE_PROVIDER_SITE_OTHER): Payer: Self-pay | Admitting: Internal Medicine

## 2010-02-16 ENCOUNTER — Inpatient Hospital Stay (HOSPITAL_COMMUNITY): Admission: EM | Admit: 2010-02-16 | Discharge: 2010-02-19 | Payer: Self-pay | Admitting: Internal Medicine

## 2010-02-16 ENCOUNTER — Ambulatory Visit: Payer: Self-pay | Admitting: Cardiovascular Disease

## 2010-02-16 ENCOUNTER — Ambulatory Visit: Payer: Self-pay | Admitting: Internal Medicine

## 2010-02-17 ENCOUNTER — Encounter: Payer: Self-pay | Admitting: Internal Medicine

## 2010-02-18 ENCOUNTER — Encounter (INDEPENDENT_AMBULATORY_CARE_PROVIDER_SITE_OTHER): Payer: Self-pay | Admitting: Gastroenterology

## 2010-02-19 ENCOUNTER — Encounter: Payer: Self-pay | Admitting: Internal Medicine

## 2010-02-19 DIAGNOSIS — R1033 Periumbilical pain: Secondary | ICD-10-CM | POA: Insufficient documentation

## 2010-02-27 ENCOUNTER — Ambulatory Visit: Payer: Self-pay | Admitting: Oncology

## 2010-03-06 ENCOUNTER — Encounter: Payer: Self-pay | Admitting: Internal Medicine

## 2010-03-06 LAB — CBC WITH DIFFERENTIAL/PLATELET
BASO%: 0.3 % (ref 0.0–2.0)
EOS%: 3.1 % (ref 0.0–7.0)
HCT: 33.6 % — ABNORMAL LOW (ref 34.8–46.6)
LYMPH%: 20.8 % (ref 14.0–49.7)
MCH: 32.2 pg (ref 25.1–34.0)
MCHC: 33.5 g/dL (ref 31.5–36.0)
MCV: 96.2 fL (ref 79.5–101.0)
MONO%: 4.9 % (ref 0.0–14.0)
NEUT%: 70.9 % (ref 38.4–76.8)
Platelets: 151 10*3/uL (ref 145–400)
RBC: 3.5 10*6/uL — ABNORMAL LOW (ref 3.70–5.45)
WBC: 4.9 10*3/uL (ref 3.9–10.3)

## 2010-03-10 LAB — SPEP & IFE WITH QIG
Beta Globulin: 6.3 % (ref 4.7–7.2)
IgA: 136 mg/dL (ref 68–378)
IgG (Immunoglobin G), Serum: 843 mg/dL (ref 694–1618)
IgM, Serum: 28 mg/dL — ABNORMAL LOW (ref 60–263)
M-Spike, %: 0.21 g/dL
Total Protein, Serum Electrophoresis: 6.4 g/dL (ref 6.0–8.3)

## 2010-03-10 LAB — COMPREHENSIVE METABOLIC PANEL
ALT: 19 U/L (ref 0–35)
AST: 18 U/L (ref 0–37)
Alkaline Phosphatase: 109 U/L (ref 39–117)
CO2: 25 mEq/L (ref 19–32)
Creatinine, Ser: 0.82 mg/dL (ref 0.40–1.20)
Sodium: 144 mEq/L (ref 135–145)
Total Bilirubin: 0.7 mg/dL (ref 0.3–1.2)
Total Protein: 6.4 g/dL (ref 6.0–8.3)

## 2010-03-10 LAB — KAPPA/LAMBDA LIGHT CHAINS: Kappa free light chain: 12.4 mg/dL — ABNORMAL HIGH (ref 0.33–1.94)

## 2010-04-07 ENCOUNTER — Ambulatory Visit: Payer: Self-pay | Admitting: Oncology

## 2010-04-17 ENCOUNTER — Telehealth: Payer: Self-pay | Admitting: Internal Medicine

## 2010-04-20 ENCOUNTER — Encounter: Payer: Self-pay | Admitting: Internal Medicine

## 2010-04-20 LAB — CBC WITH DIFFERENTIAL/PLATELET
Basophils Absolute: 0 10*3/uL (ref 0.0–0.1)
EOS%: 6.5 % (ref 0.0–7.0)
HGB: 11 g/dL — ABNORMAL LOW (ref 11.6–15.9)
LYMPH%: 34.5 % (ref 14.0–49.7)
MCH: 31.2 pg (ref 25.1–34.0)
MCV: 95.2 fL (ref 79.5–101.0)
MONO%: 8.6 % (ref 0.0–14.0)
RDW: 13.3 % (ref 11.2–14.5)

## 2010-04-20 IMAGING — CT CT NECK W/ CM
4 of 6 series · 14 of 33 positions shown, 16 images · IV contrast (omnipaque)
Comparison: Soft tissue neck 03/27/2009.

CLINICAL DATA: Shortness of breath.  Wheezing and stridor.

CT NECK WITH CONTRAST
TECHNIQUE: Multidetector CT imaging of the neck was performed with
intravenous contrast.
Contrast: 75 ml Omnipaque 300

[Series 3: st neck 2.0 b31s · axial · 0.39mm/px · z∈[-194,-60]mm · 4 of 109 slices shown, 5 images]
[im 22/109  soft-tissue]
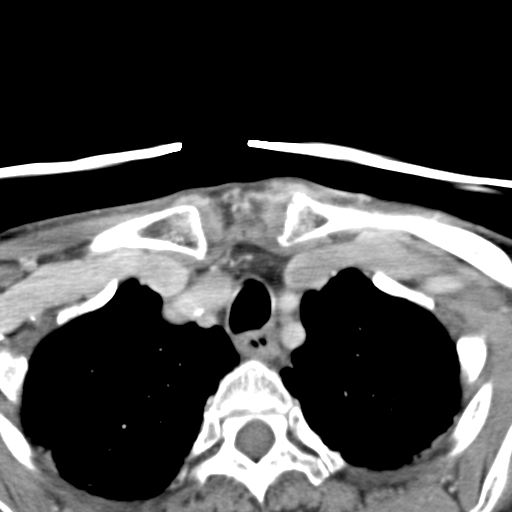
[im 22/109  bone]
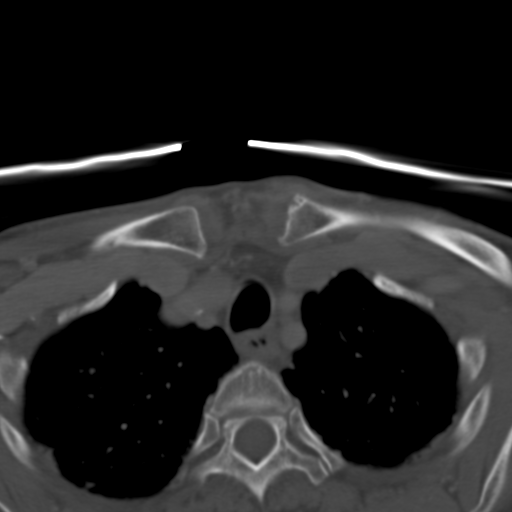
[im 44/109  bone]
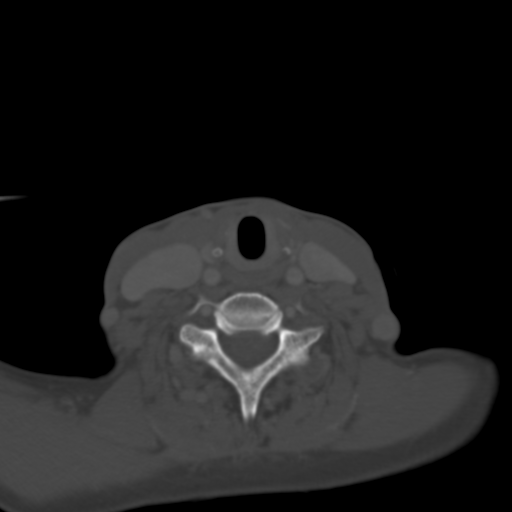
[im 65/109  bone]
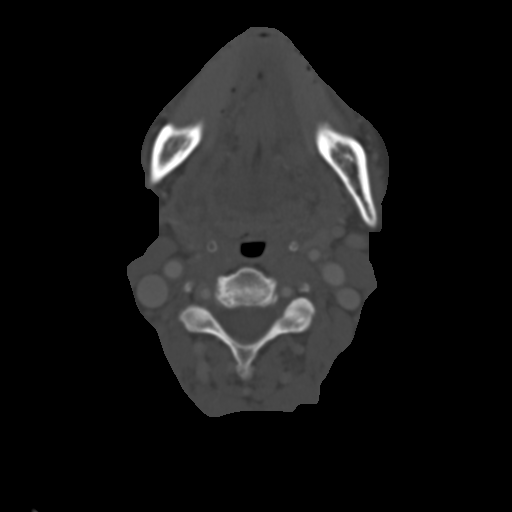
[im 87/109  bone]
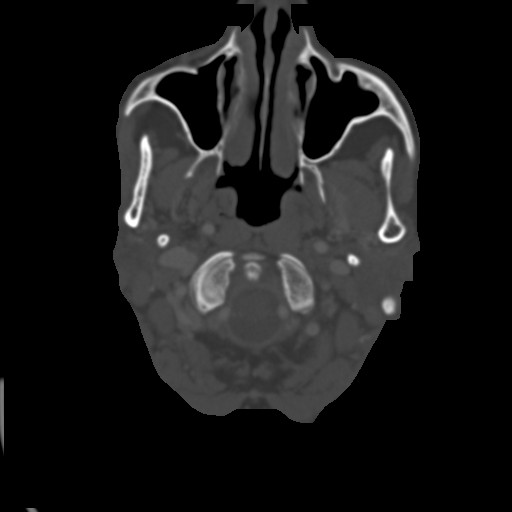

[Series 602: sagittal st · sagittal · 0.43mm/px · 5 of 48 slices shown, 6 images]
[im 16/48  bone]
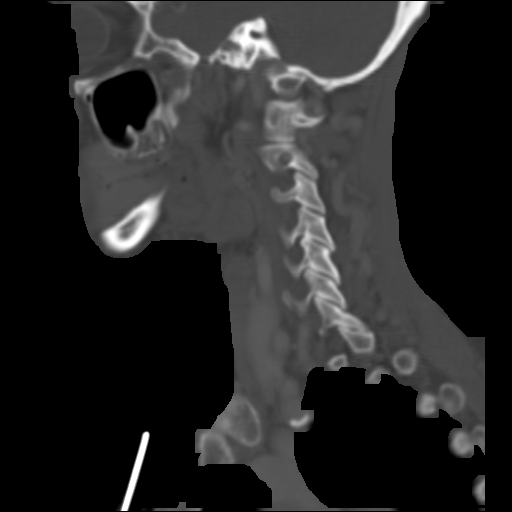
[im 20/48  bone]
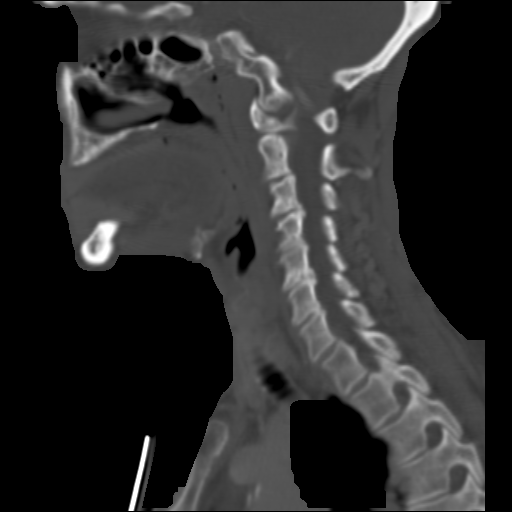
[im 24/48  soft-tissue]
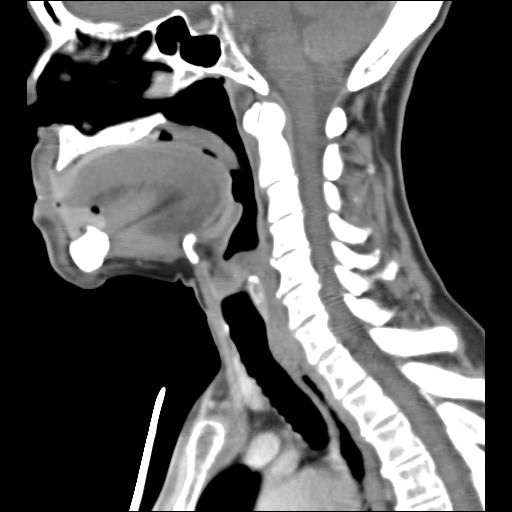
[im 24/48  bone]
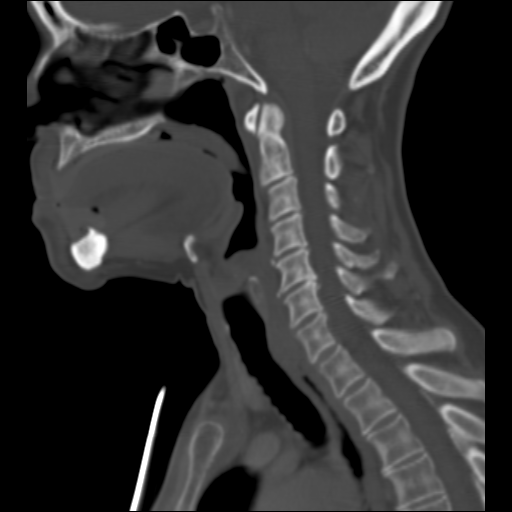
[im 28/48  bone]
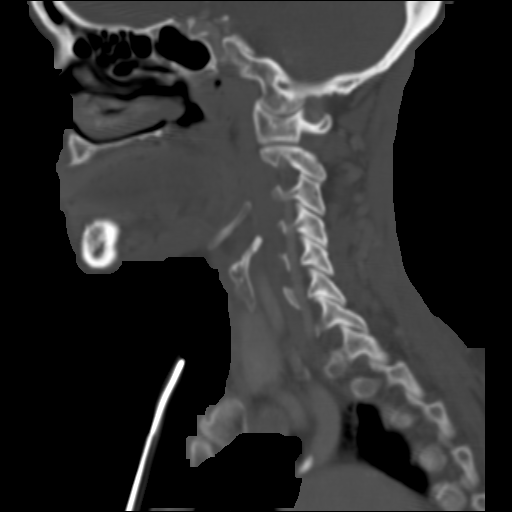
[im 32/48  bone]
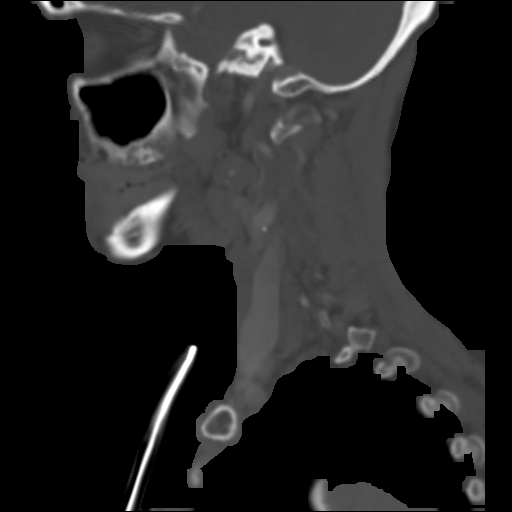

[Series 603: coronal st · coronal · 0.43mm/px · 3 of 54 slices shown]
[im 14/54  bone]
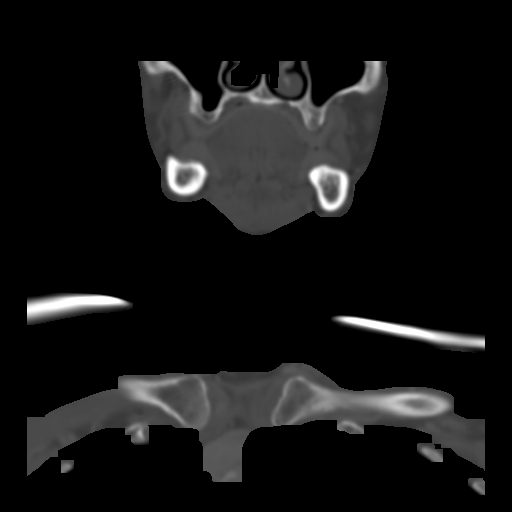
[im 23/54  bone]
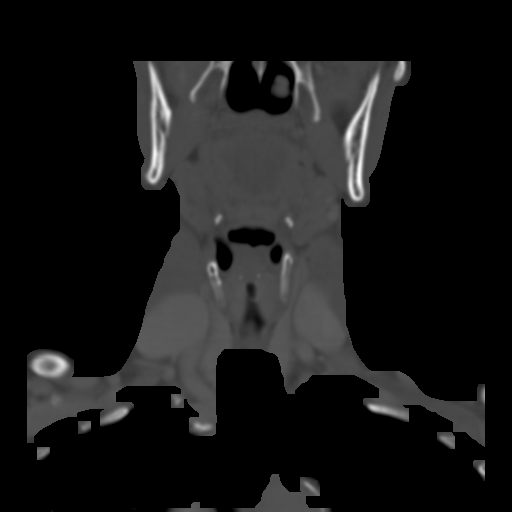
[im 32/54  bone]
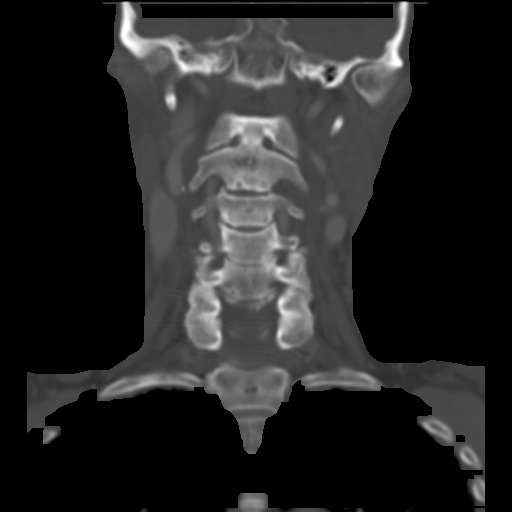

[Series 604: axial st · axial · 0.43mm/px · z∈[-212,-141]mm · 2 of 71 slices shown]
[im 24/71  bone]
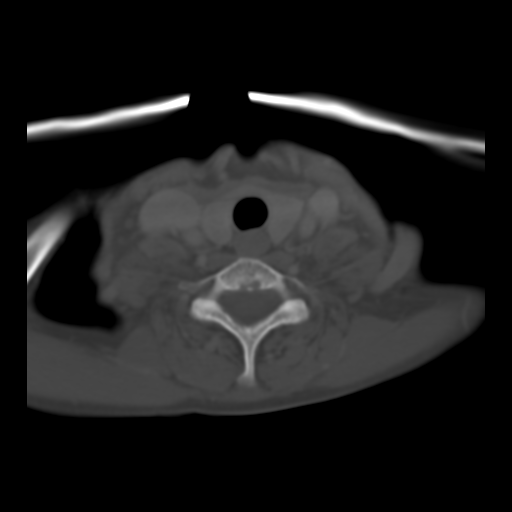
[im 47/71  bone]
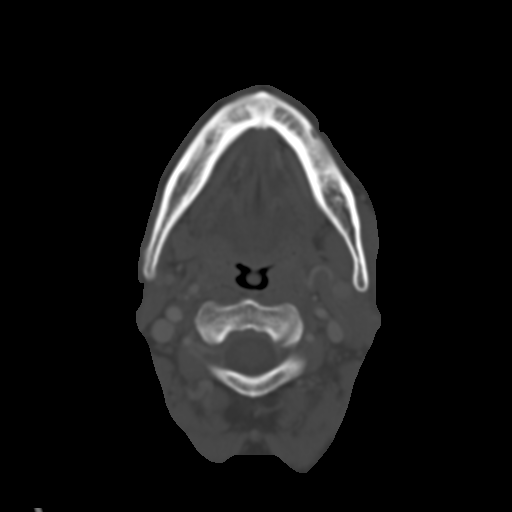

[14 of 33 positions shown; findings below may reference images not displayed]

FINDINGS: No focal mucosal or submucosal lesion is present.  The
vocal cords are midline.  The larynx is closed.  The airway is
otherwise patent.  Limited imaging of the brain is unremarkable.

There is no significant cervical adenopathy.  The thyroid gland is
within normal limits.

There is mild pleural scarring at the lung apices bilaterally.
Atherosclerotic calcifications are noted at the aortic arch.  Bone
windows demonstrate to mild spondylosis at C4-5, C5-6, and C6-7, C5-
6 is the worst level.
IMPRESSION: 1.  No focal lesion or significant inflammation to explain the
patient's symptoms.
2.  No significant cervical adenopathy.
3.  Bilateral apical pleural scarring is likely chronic.  The
4.  Atherosclerosis.

## 2010-04-22 LAB — SPEP & IFE WITH QIG
Beta 2: 3.6 % (ref 3.2–6.5)
IgA: 136 mg/dL (ref 68–378)
IgG (Immunoglobin G), Serum: 796 mg/dL (ref 694–1618)
IgM, Serum: 21 mg/dL — ABNORMAL LOW (ref 60–263)

## 2010-04-22 LAB — COMPREHENSIVE METABOLIC PANEL
AST: 16 U/L (ref 0–37)
Albumin: 3.8 g/dL (ref 3.5–5.2)
Alkaline Phosphatase: 113 U/L (ref 39–117)
BUN: 16 mg/dL (ref 6–23)
Creatinine, Ser: 0.82 mg/dL (ref 0.40–1.20)
Potassium: 3.6 mEq/L (ref 3.5–5.3)
Total Bilirubin: 0.6 mg/dL (ref 0.3–1.2)

## 2010-04-22 LAB — KAPPA/LAMBDA LIGHT CHAINS: Kappa:Lambda Ratio: 8.73 — ABNORMAL HIGH (ref 0.26–1.65)

## 2010-05-01 ENCOUNTER — Ambulatory Visit (HOSPITAL_COMMUNITY): Admission: RE | Admit: 2010-05-01 | Discharge: 2010-05-01 | Payer: Self-pay | Admitting: Internal Medicine

## 2010-05-01 ENCOUNTER — Ambulatory Visit: Payer: Self-pay | Admitting: Internal Medicine

## 2010-05-01 ENCOUNTER — Encounter: Payer: Self-pay | Admitting: Internal Medicine

## 2010-05-01 DIAGNOSIS — I499 Cardiac arrhythmia, unspecified: Secondary | ICD-10-CM | POA: Insufficient documentation

## 2010-05-04 LAB — CONVERTED CEMR LAB
CO2: 25 meq/L (ref 19–32)
Calcium: 9.3 mg/dL (ref 8.4–10.5)
Glucose, Bld: 95 mg/dL (ref 70–99)
Potassium: 4.6 meq/L (ref 3.5–5.3)
Sodium: 141 meq/L (ref 135–145)

## 2010-05-25 ENCOUNTER — Ambulatory Visit: Payer: Self-pay | Admitting: Oncology

## 2010-05-26 ENCOUNTER — Encounter: Payer: Self-pay | Admitting: Internal Medicine

## 2010-05-26 ENCOUNTER — Encounter: Payer: Self-pay | Admitting: Cardiology

## 2010-05-26 LAB — CBC WITH DIFFERENTIAL/PLATELET
Basophils Absolute: 0 10*3/uL (ref 0.0–0.1)
Eosinophils Absolute: 0.2 10*3/uL (ref 0.0–0.5)
HCT: 31.9 % — ABNORMAL LOW (ref 34.8–46.6)
HGB: 10.9 g/dL — ABNORMAL LOW (ref 11.6–15.9)
MONO#: 0.3 10*3/uL (ref 0.1–0.9)
NEUT#: 1.9 10*3/uL (ref 1.5–6.5)
NEUT%: 58.7 % (ref 38.4–76.8)
RDW: 14.4 % (ref 11.2–14.5)
WBC: 3.3 10*3/uL — ABNORMAL LOW (ref 3.9–10.3)
lymph#: 0.9 10*3/uL (ref 0.9–3.3)

## 2010-05-26 LAB — COMPREHENSIVE METABOLIC PANEL
AST: 16 U/L (ref 0–37)
Albumin: 3.5 g/dL (ref 3.5–5.2)
BUN: 12 mg/dL (ref 6–23)
CO2: 28 mEq/L (ref 19–32)
Calcium: 9.5 mg/dL (ref 8.4–10.5)
Chloride: 108 mEq/L (ref 96–112)
Creatinine, Ser: 1.05 mg/dL (ref 0.40–1.20)
Potassium: 4 mEq/L (ref 3.5–5.3)

## 2010-05-28 LAB — SPEP & IFE WITH QIG
Alpha-1-Globulin: 5.9 % — ABNORMAL HIGH (ref 2.9–4.9)
Alpha-2-Globulin: 11.7 % (ref 7.1–11.8)
Beta Globulin: 5.8 % (ref 4.7–7.2)
Gamma Globulin: 12.1 % (ref 11.1–18.8)
IgG (Immunoglobin G), Serum: 750 mg/dL (ref 694–1618)
Total Protein, Serum Electrophoresis: 6 g/dL (ref 6.0–8.3)

## 2010-05-28 LAB — KAPPA/LAMBDA LIGHT CHAINS
Kappa free light chain: 39.1 mg/dL — ABNORMAL HIGH (ref 0.33–1.94)
Kappa:Lambda Ratio: 23.27 — ABNORMAL HIGH (ref 0.26–1.65)

## 2010-06-10 ENCOUNTER — Telehealth: Payer: Self-pay | Admitting: Internal Medicine

## 2010-06-16 ENCOUNTER — Ambulatory Visit: Payer: Self-pay | Admitting: Internal Medicine

## 2010-07-08 ENCOUNTER — Ambulatory Visit: Payer: Self-pay | Admitting: Oncology

## 2010-07-10 ENCOUNTER — Encounter: Payer: Self-pay | Admitting: Cardiology

## 2010-07-10 ENCOUNTER — Ambulatory Visit (HOSPITAL_COMMUNITY)
Admission: RE | Admit: 2010-07-10 | Discharge: 2010-07-10 | Payer: Self-pay | Source: Home / Self Care | Attending: Oncology | Admitting: Oncology

## 2010-07-10 LAB — CBC WITH DIFFERENTIAL/PLATELET
BASO%: 0.4 % (ref 0.0–2.0)
Basophils Absolute: 0 10*3/uL (ref 0.0–0.1)
EOS%: 2.4 % (ref 0.0–7.0)
Eosinophils Absolute: 0.1 10*3/uL (ref 0.0–0.5)
HCT: 31.8 % — ABNORMAL LOW (ref 34.8–46.6)
HGB: 10.7 g/dL — ABNORMAL LOW (ref 11.6–15.9)
LYMPH%: 24.8 % (ref 14.0–49.7)
MCH: 31.8 pg (ref 25.1–34.0)
MCHC: 33.7 g/dL (ref 31.5–36.0)
MCV: 94.5 fL (ref 79.5–101.0)
MONO#: 0.4 10*3/uL (ref 0.1–0.9)
MONO%: 7.3 % (ref 0.0–14.0)
NEUT#: 3.3 10*3/uL (ref 1.5–6.5)
NEUT%: 65.1 % (ref 38.4–76.8)
Platelets: 240 10*3/uL (ref 145–400)
RBC: 3.36 10*6/uL — ABNORMAL LOW (ref 3.70–5.45)
RDW: 13.9 % (ref 11.2–14.5)
WBC: 5.1 10*3/uL (ref 3.9–10.3)
lymph#: 1.3 10*3/uL (ref 0.9–3.3)

## 2010-07-10 LAB — COMPREHENSIVE METABOLIC PANEL
ALT: 18 U/L (ref 0–35)
AST: 19 U/L (ref 0–37)
Albumin: 3.4 g/dL — ABNORMAL LOW (ref 3.5–5.2)
Alkaline Phosphatase: 112 U/L (ref 39–117)
BUN: 13 mg/dL (ref 6–23)
CO2: 24 mEq/L (ref 19–32)
Calcium: 8.4 mg/dL (ref 8.4–10.5)
Chloride: 105 mEq/L (ref 96–112)
Creatinine, Ser: 0.85 mg/dL (ref 0.40–1.20)
Glucose, Bld: 100 mg/dL — ABNORMAL HIGH (ref 70–99)
Potassium: 3.4 mEq/L — ABNORMAL LOW (ref 3.5–5.3)
Sodium: 142 mEq/L (ref 135–145)
Total Bilirubin: 0.7 mg/dL (ref 0.3–1.2)
Total Protein: 6 g/dL (ref 6.0–8.3)

## 2010-07-12 ENCOUNTER — Encounter: Payer: Self-pay | Admitting: Oncology

## 2010-07-19 LAB — CONVERTED CEMR LAB
Basophils Absolute: 0 10*3/uL (ref 0.0–0.1)
CO2: 24 meq/L (ref 19–32)
Calcium: 9.1 mg/dL (ref 8.4–10.5)
Chloride: 106 meq/L (ref 96–112)
Chloride: 98 meq/L (ref 96–112)
Glucose, Bld: 93 mg/dL (ref 70–99)
HCT: 36.6 % (ref 36.0–46.0)
Lymphocytes Relative: 30 % (ref 12–46)
Lymphs Abs: 2.3 10*3/uL (ref 0.7–4.0)
Neutro Abs: 5 10*3/uL (ref 1.7–7.7)
Neutrophils Relative %: 64 % (ref 43–77)
Platelets: 270 10*3/uL (ref 150–400)
Potassium: 3.6 meq/L (ref 3.5–5.3)
Potassium: 5.2 meq/L (ref 3.5–5.3)
Pro B Natriuretic peptide (BNP): 131 pg/mL — ABNORMAL HIGH (ref 0.0–100.0)
RDW: 12.9 % (ref 11.5–15.5)
Relative Index: 0.9 (ref 0.0–2.5)
Sodium: 133 meq/L — ABNORMAL LOW (ref 135–145)
Sodium: 140 meq/L (ref 135–145)
Total CK: 133 units/L (ref 7–177)
Troponin I: 0.02 ng/mL (ref <0.06)
WBC: 7.8 10*3/uL (ref 4.0–10.5)

## 2010-07-23 NOTE — Assessment & Plan Note (Signed)
Summary: EST-CK/FU/MEDS/CFB   Vital Signs:  Patient profile:   77 year old female Height:      65 inches Weight:      136.2 pounds BMI:     22.75 Temp:     97.9 degrees F oral Pulse rate:   48 / minute BP sitting:   142 / 60  (right arm)  Vitals Entered By: Kayla Brooks NT II (September 03, 2009 11:18 AM) CC: NEED REFILLS, Depression Is Patient Diabetic? No Pain Assessment Patient in pain? no      Nutritional Status BMI of 19 -24 = normal  Have you ever been in a relationship where you felt threatened, hurt or afraid?No   Does patient need assistance? Functional Status Self care Ambulation Normal   Primary Care Provider:  Elby Showers MD  CC:  NEED REFILLS and Depression.  History of Present Illness: 31yof with pmh outlined in this chart presents for check up.  She reports that she is doing well.  Her mood is OK, better than previous.  1/2 tab sertroline does better, whole tab makes her hallucinate and have nightmares.  She is also using the xanax when she feels especially stressed and gets the tight feeling in her chest.  This is working well.  She is having aching in her hip more and more. It hurts when she stands up from sitting.  Her next ortho appt is in July.  Sometimes feels like her knee is going to give out on her.      Depression History:      The patient denies a depressed mood most of the day and a diminished interest in her usual daily activities.         Preventive Screening-Counseling & Management  Alcohol-Tobacco     Alcohol drinks/day: 0     Smoking Status: quit     Year Quit: 7/07  Caffeine-Diet-Exercise     Does Patient Exercise: yes     Type of exercise: walking     Times/week: 3  Current Medications (verified): 1)  Lisinopril 40 Mg Tabs (Lisinopril) .... Take 1 Tablet By Mouth Once A Day 2)  Omeprazole 20 Mg  Cpdr (Omeprazole) .... Take One To Two Pills Once A Day 3)  Zocor 20 Mg  Tabs (Simvastatin) .... Take 1 Tab By Mouth At  Bedtime 4)  Nitrostat 0.4 Mg  Subl (Nitroglycerin) .... Place 1 Tablet Under Tongue For Chest Pain. Every 5 Minutes For A Maximum of 3 Times. Call Doctor If Pain Persists. 5)  Proair Hfa 108 (90 Base) Mcg/act Aers (Albuterol Sulfate) .... Take 2 Puffs As Needed. 6)  Norvasc 5 Mg Tabs (Amlodipine Besylate) .... Take 1 Tablet By Mouth Once A Day 7)  Calcium 600/ D .... 2 Tabs Am 8)  Fosamax 35 Mg Tabs (Alendronate Sodium) .... Take One Tablet Weekly To Strengthen Bones.  Sit Up For 30 Minutes After Taking. 9)  Aspirin 325 Mg Tabs (Aspirin) .... Take 1 Tablet By Mouth Two Times A Day 10)  Peri-Colace 8.6-50 Mg Tabs (Sennosides-Docusate Sodium) .... Take One Tablet Once A Day. Stop If You Have Diarrhea. 11)  Iferex 150 150 Mg Caps (Polysaccharide Iron Complex) .... Take Once Daily 12)  Sertraline Hcl 25 Mg Tabs (Sertraline Hcl) .... Take One Half  Tablet Daily. 13)  Xanax 0.25 Mg Tabs (Alprazolam) .... Take One Tablet Two Times A Day As Needed For Throat Tightness or Anxiety. 14)  Vicodin 5-500 Mg Tabs (Hydrocodone-Acetaminophen) .... Take One Tablet Every  4-6 Hours As Needed For Pain. 15)  Revlimid 15 Mg Caps (Lenalidomide) .... One Tablet Daily.  Allergies (verified): No Known Drug Allergies  Review of Systems       per hpi  Physical Exam  General:  alert and well-developed.   Head:  normocephalic and atraumatic.   Eyes:  vision grossly intact, pupils equal, pupils round, and pupils reactive to light.   Mouth:  pharynx pink and moist.   Lungs:  normal respiratory effort and normal breath sounds.   Heart:  normal rate, regular rhythm, no murmur, no gallop, and no rub.   Msk:  She is wearing a skirt so hard to examine the hips.  Gait is normal, moves easily onto exam table, no problems with flexion/extension or rotation of the hips.  strength is 5/5. Extremities:  no edema Neurologic:  alert & oriented X3, cranial nerves II-XII intact, strength normal in all extremities, and gait normal.    Skin:  no suspicious lesions.   Psych:  Oriented X3, memory intact for recent and remote, and withdrawn.     Impression & Recommendations:  Problem # 1:  HIP PAIN, RIGHT (ICD-719.45) Worrysome that this is getting worse and not better.  I think she might be tightening up after the surgery.  I have advised her to call her orthopedist.  She may benefit from continued PT.  The following medications were removed from the medication list:    Oxycodone Hcl 5 Mg Tabs (Oxycodone hcl) .Marland Kitchen... Take 1-3 tablets every 4 hours as needed for pain. Her updated medication list for this problem includes:    Aspirin 325 Mg Tabs (Aspirin) .Marland Kitchen... Take 1 tablet by mouth two times a day    Vicodin 5-500 Mg Tabs (Hydrocodone-acetaminophen) .Marland Kitchen... Take one tablet every 4-6 hours as needed for pain.  Problem # 2:  MULTIPLE  MYELOMA (ICD-203.00) Following with heme/onc and doing well.  Problem # 3:  DYSLIPIDEMIA (ICD-272.4) check lipids and cmet today.  Her updated medication list for this problem includes:    Zocor 20 Mg Tabs (Simvastatin) .Marland Kitchen... Take 1 tab by mouth at bedtime  Orders: T-Lipid Profile [phone])  Labs Reviewed: SGOT: 22 (07/09/2008)   SGPT: 17 (07/09/2008)  Prior 10 Yr Risk Heart Disease: 5 % (03/27/2009)   HDL:67 (07/09/2008), 65 (11/21/2006)  LDL:70 (07/09/2008), 100 (76/28/3151)  Chol:154 (07/09/2008), 183 (11/21/2006)  Trig:87 (07/09/2008), 89 (11/21/2006)  Problem # 4:  DEPRESSION (ICD-311) Mood is stabilized on current regimen.  She still seems kind of flat in converstation, but reports that she is feeling fine.  No longer crying all the time and having an easier time sleeping.  given that her cardiac condition is exacerbated by stress, I think the xanax is working very well for her.  Her updated medication list for this problem includes:    Sertraline Hcl 25 Mg Tabs (Sertraline hcl) .Marland Kitchen... Take one half  tablet daily.    Xanax 0.25 Mg Tabs (Alprazolam) .Marland Kitchen... Take one tablet two  times a day as needed for throat tightness or anxiety.  Problem # 5:  HYPERTENSION (ICD-401.9) BP is slightly above goal today, has been controled in the past.  Conintue to monitor.  Her updated medication list for this problem includes:    Lisinopril 40 Mg Tabs (Lisinopril) .Marland Kitchen... Take 1 tablet by mouth once a day    Norvasc 5 Mg Tabs (Amlodipine besylate) .Marland Kitchen... Take 1 tablet by mouth once a day  BP today: 142/60 Prior BP: 102/54 (03/31/2009)  Prior 10  Yr Risk Heart Disease: 5 % (03/27/2009)  Labs Reviewed: K+: 4.4 (07/09/2008) Creat: : 1.02 (07/09/2008)   Chol: 154 (07/09/2008)   HDL: 67 (07/09/2008)   LDL: 70 (07/09/2008)   TG: 87 (07/09/2008)  Complete Medication List: 1)  Lisinopril 40 Mg Tabs (Lisinopril) .... Take 1 tablet by mouth once a day 2)  Omeprazole 20 Mg Cpdr (Omeprazole) .... Take one to two pills once a day 3)  Zocor 20 Mg Tabs (Simvastatin) .... Take 1 tab by mouth at bedtime 4)  Nitrostat 0.4 Mg Subl (Nitroglycerin) .... Place 1 tablet under tongue for chest pain. every 5 minutes for a maximum of 3 times. call doctor if pain persists. 5)  Proair Hfa 108 (90 Base) Mcg/act Aers (Albuterol sulfate) .... Take 2 puffs as needed. 6)  Norvasc 5 Mg Tabs (Amlodipine besylate) .... Take 1 tablet by mouth once a day 7)  Calcium 600/ D  .... 2 tabs am 8)  Fosamax 35 Mg Tabs (Alendronate sodium) .... Take one tablet weekly to strengthen bones.  sit up for 30 minutes after taking. 9)  Aspirin 325 Mg Tabs (Aspirin) .... Take 1 tablet by mouth two times a day 10)  Peri-colace 8.6-50 Mg Tabs (Sennosides-docusate sodium) .... Take one tablet once a day. stop if you have diarrhea. 11)  Iferex 150 150 Mg Caps (Polysaccharide iron complex) .... Take once daily 12)  Sertraline Hcl 25 Mg Tabs (Sertraline hcl) .... Take one half  tablet daily. 13)  Xanax 0.25 Mg Tabs (Alprazolam) .... Take one tablet two times a day as needed for throat tightness or anxiety. 14)  Vicodin 5-500 Mg Tabs  (Hydrocodone-acetaminophen) .... Take one tablet every 4-6 hours as needed for pain. 15)  Revlimid 15 Mg Caps (Lenalidomide) .... One tablet daily.  Other Orders: T-Comprehensive Metabolic Panel ([id number]) Mammogram (Screening) (Mammo)  Patient Instructions: 1)  You are doing very well! 2)  You had lab work done today, we will call you if there is anything that needs to be addressed before your next appointment. 3)  Please schedule a follow-up appointment in 3 months. Prescriptions: XANAX 0.25 MG TABS (ALPRAZOLAM) Take one tablet two times a day as needed for throat tightness or anxiety.  #30 x 2   Entered and Authorized by:   Kayla Showers MD   Signed by:   Kayla Showers MD on 09/03/2009   Method used:   Print then Give to Patient   RxID:   [id number]   Prevention & Chronic Care Immunizations   Influenza vaccine: Fluvax 3+  (05/29/2008)    Tetanus booster: Not documented    Pneumococcal vaccine: Not documented    H. zoster vaccine: Not documented  Colorectal Screening   Hemoccult: Negative  (01/30/2006)    Colonoscopy:  Pathology:  Adenomatous polyp.          (08/17/2007)   Colonoscopy action/deferral: Repeat colonoscopy in 5 years.   (08/17/2007)   Colonoscopy due: 08/16/2012  Other Screening   Pap smear: Not documented   Pap smear action/deferral: Not indicated S/P hysterectomy  (09/03/2009)    Mammogram: No specific mammographic evidence of malignancy.  Assessment: BIRADS 1.   (03/20/2008)   Mammogram action/deferral: Ordered  (09/03/2009)    DXA bone density scan: Not documented   Smoking status: quit  (09/03/2009)  Lipids   Total Cholesterol: 154  (07/09/2008)   Lipid panel action/deferral: Lipid Panel ordered   LDL: 70  (07/09/2008)   LDL Direct: Not documented   HDL: 67  (07/09/2008)  Triglycerides: 87  (07/09/2008)    SGOT (AST): 22  (07/09/2008)   BMP action: Ordered   SGPT (ALT): 17  (07/09/2008) CMP ordered    Alkaline  phosphatase: 125  (07/09/2008)   Total bilirubin: 0.4  (07/09/2008)    Lipid flowsheet reviewed?: Yes   Progress toward LDL goal: Unchanged  Hypertension   Last Blood Pressure: 142 / 60  (09/03/2009)   Serum creatinine: 1.02  (07/09/2008)   BMP action: Ordered   Serum potassium 4.4  (07/09/2008) CMP ordered     Hypertension flowsheet reviewed?: Yes   Progress toward BP goal: Deteriorated  Self-Management Support :   Personal Goals (by the next clinic visit) :      Personal blood pressure goal: 140/90  (03/27/2009)   Patient will work on the following items until the next clinic visit to reach self-care goals:     Medications and monitoring: take my medicines every day, bring all of my medications to every visit  (09/03/2009)     Eating: drink diet soda or water instead of juice or soda, eat more vegetables, use fresh or frozen vegetables, eat foods that are low in salt, eat baked foods instead of fried foods, eat fruit for snacks and desserts, limit or avoid alcohol  (09/03/2009)     Other: NOT ABLE TO DO FULL 30 MINUTES, BECAUSE OF SURGERY  (03/31/2009)    Hypertension self-management support: Education handout, Resources for patients handout  (09/03/2009)   Hypertension education handout printed    Lipid self-management support: Education handout, Resources for patients handout  (09/03/2009)     Lipid education handout printed      Resource handout printed.   Nursing Instructions: Schedule screening mammogram (see order)

## 2010-07-23 NOTE — Letter (Signed)
[facility]   Imported By: Roderic Ovens 07/23/2009 11:24:20  _____________________________________________________________________  External Attachment:    Type:   Image     Comment:   External Document

## 2010-07-23 NOTE — Progress Notes (Signed)
Summary[facility]GR[facility]  [facility]/ [facility]   Imported By: Margie Billet 09/25/2009 09:50:36  _____________________________________________________________________  External Attachment:    Type:   Image     Comment:   External Document

## 2010-07-23 NOTE — Progress Notes (Signed)
Summary: Refill/gh  Phone Note Refill Request Message from:  Patient on December 09, 2009 8:46 AM  Refills Requested: Medication #1:  LISINOPRIL 40 MG TABS Take 1 tablet by mouth once a day Last labs and visit 09/03/2009.   Method Requested: Electronic Initial call taken by: Angelina Ok RN,  December 09, 2009 8:46 AM  Follow-up for Phone Call        Dr Clent Ridges wanted pt to F/U in June - no appts in system.  Sent flag to Ms Lissa Hoard to schedule appt in July  Will refill med Follow-up by: Blanch Media MD,  December 09, 2009 9:07 AM    Prescriptions: LISINOPRIL 40 MG TABS (LISINOPRIL) Take 1 tablet by mouth once a day  #30 x 2   Entered and Authorized by:   Blanch Media MD   Signed by:   Blanch Media MD on 12/09/2009   Method used:   Electronically to        [facility] [address] (retail)       [address]       [place], [place]  [id number]       Ph: [phone]       Fax: [phone]   RxID:   [id number]

## 2010-07-23 NOTE — Letter (Signed)
Summary: [facility]   [facility]   Imported By: Roderic Ovens 06/24/2009 13:12:28  _____________________________________________________________________  External Attachment:    Type:   Image     Comment:   External Document

## 2010-07-23 NOTE — Letter (Signed)
[facility]   [facility]   Imported By: Louretta Parma 01/15/2010 13:58:48  _____________________________________________________________________  External Attachment:    Type:   Image     Comment:   External Document

## 2010-07-23 NOTE — Letter (Signed)
Summary: [facility]  [facility]   Imported By: Florinda Marker 09/02/2009 15:51:06  _____________________________________________________________________  External Attachment:    Type:   Image     Comment:   External Document

## 2010-07-23 NOTE — Progress Notes (Signed)
Summary: refill/ [facility]  Phone Note Refill Request Message from:  Patient on June 10, 2010 2:03 PM  Refills Requested: Medication #1:  OMEPRAZOLE 20 MG  CPDR Take one to two pills once a day   Dosage confirmed as above?Dosage Confirmed last appt 04/2010  Initial call taken by: Marin Roberts RN,  June 10, 2010 2:03 PM  Follow-up for Phone Call        Refilled electronically.  Follow-up by: Margarito Liner MD,  June 10, 2010 3:24 PM    Prescriptions: OMEPRAZOLE 20 MG  CPDR (OMEPRAZOLE) Take one to two pills once a day  #62 Capsule x 3   Entered by:   Margarito Liner MD   Authorized by:   Mliss Sax MD   Signed by:   Margarito Liner MD on 06/10/2010   Method used:   Electronically to        [facility] (retail)       [address]       [place], [place]  [id number]       Ph: [phone]       Fax: [phone]   RxID:   [id number]

## 2010-07-23 NOTE — Assessment & Plan Note (Signed)
Summary: 1 month f/u per dr walsh/cfb   Vital Signs:  Patient profile:   77 year old female Height:      65 inches (165.10 cm) Weight:      135.2 pounds (61.45 kg) BMI:     22.58 Temp:     98.0 degrees F (36.67 degrees C) oral Pulse rate:   46 / minute BP sitting:   137 / 64  (left arm)  Vitals Entered By: Stanton Kidney Ditzler RN (January 13, 2010 2:40 PM) Is Patient Diabetic? No Pain Assessment Patient in pain? no      Nutritional Status BMI of 19 -24 = normal Nutritional Status Detail appetite good  Have you ever been in a relationship where you felt threatened, hurt or afraid?denies   Does patient need assistance? Functional Status Self care Ambulation Normal Comments Occ diarrhea past month - pt ? from [facility] Dr Juliette Alcide.   Primary Care Provider:  Elby Showers MD   History of Present Illness: 77 yo female with PMH outlined below presents to [facility] for regular follow up appointment. Her main concern today is diarrhea that she has been having for the past month ever since she starteed new chemo drug by her oncologist. She has no concerns at the time. No recent sicknesses or hospitalizaitons. No episodes of chest pain, SOB, palpitations, no fever or chills. No specific abdominal or urinary concerns, no blood in stool or urine. No recent changes in appetite, weight, sleep patterns, mood.    Depression History:      The patient denies a depressed mood most of the day and a diminished interest in her usual daily activities.  The patient denies significant weight loss, significant weight gain, insomnia, hypersomnia, psychomotor agitation, psychomotor retardation, fatigue (loss of energy), feelings of worthlessness (guilt), impaired concentration (indecisiveness), and recurrent thoughts of death or suicide.        The patient denies that she feels like life is not worth living, denies that she wishes that she were dead, and denies that she has thought about ending her life.          Preventive Screening-Counseling & Management  Alcohol-Tobacco     Alcohol drinks/day: 0     Smoking Status: quit     Year Quit: 7/07  Caffeine-Diet-Exercise     Does Patient Exercise: yes     Type of exercise: walking     Times/week: 3  Problems Prior to Update: 1)  ? of Heparin-induced Thrombocytopenia  (ICD-289.84) 2)  Multiple Myeloma  (ICD-203.00) 3)  Hip Pain, Right  (ICD-719.45) 4)  Sx of Dyspnea  (ICD-786.05) 5)  Edema  (ICD-782.3) 6)  Myocardial Infarction, Anterior Wall, Subsequent Care  (ICD-410.12) 7)  Alkaline Phosphatase, Elevated  (ICD-790.5) 8)  Hyperglycemia, Fasting  (ICD-790.29) 9)  Adenomatous Colonic Polyp  (ICD-211.3) 10)  Depression  (ICD-311) 11)  Dyslipidemia  (ICD-272.4) 12)  Preventive Health Care  (ICD-V70.0) 13)  Insomnia-sleep Disorder-unspec  (ICD-780.52) 14)  Bradycardia  (ICD-427.89) 15)  Guaiac Positive Stool  (ICD-578.1) 16)  Takotsubo Syndrome  (ICD-429.83) 17)  Bunion, Right Foot  (ICD-727.1) 18)  Leukocytosis  (ICD-288.8) 19)  S/P Tah +-rmvl Tube +-rmvl Ovary  (CPT-58150) 20)  COPD  (ICD-496) 21)  Tobacco Use, Quit  (ICD-V15.82) 22)  Hypertension  (ICD-401.9) 23)  Gerd  (ICD-530.81)  Medications Prior to Update: 1)  Lisinopril 40 Mg Tabs (Lisinopril) .... Take 1 Tablet By Mouth Once A Day 2)  Omeprazole 20 Mg  Cpdr (Omeprazole) .... Take One To  Two Pills Once A Day 3)  Zocor 20 Mg  Tabs (Simvastatin) .... Take 1 Tab By Mouth At Bedtime 4)  Nitrostat 0.4 Mg  Subl (Nitroglycerin) .... Place 1 Tablet Under Tongue For Chest Pain. Every 5 Minutes For A Maximum of 3 Times. Call Doctor If Pain Persists. 5)  Proair Hfa 108 (90 Base) Mcg/act Aers (Albuterol Sulfate) .... Take 2 Puffs As Needed. 6)  Norvasc 5 Mg Tabs (Amlodipine Besylate) .... Take 1 Tablet By Mouth Once A Day 7)  Calcium 600/ D .... 2 Tabs Am 8)  Fosamax 35 Mg Tabs (Alendronate Sodium) .... Take One Tablet Weekly To Strengthen Bones.  Sit Up For 30 Minutes After Taking. 9)   Aspirin 325 Mg Tabs (Aspirin) .... Take 1 Tablet By Mouth Two Times A Day 10)  Peri-Colace 8.6-50 Mg Tabs (Sennosides-Docusate Sodium) .... Take One Tablet Once A Day. Stop If You Have Diarrhea. 11)  Iferex 150 150 Mg Caps (Polysaccharide Iron Complex) .... Take Once Daily 12)  Sertraline Hcl 25 Mg Tabs (Sertraline Hcl) .... Take One Half  Tablet Daily. 13)  Xanax 0.25 Mg Tabs (Alprazolam) .... Take One Tablet Two Times A Day As Needed For Throat Tightness or Anxiety. 14)  Vicodin 5-500 Mg Tabs (Hydrocodone-Acetaminophen) .... Take One Tablet Every 4-6 Hours As Needed For Pain. 15)  Revlimid 15 Mg Caps (Lenalidomide) .... One Tablet Daily.  Current Medications (verified): 1)  Lisinopril 40 Mg Tabs (Lisinopril) .... Take 1 Tablet By Mouth Once A Day 2)  Omeprazole 20 Mg  Cpdr (Omeprazole) .... Take One To Two Pills Once A Day 3)  Zocor 20 Mg  Tabs (Simvastatin) .... Take 1 Tab By Mouth At Bedtime 4)  Nitrostat 0.4 Mg  Subl (Nitroglycerin) .... Place 1 Tablet Under Tongue For Chest Pain. Every 5 Minutes For A Maximum of 3 Times. Call Doctor If Pain Persists. 5)  Proair Hfa 108 (90 Base) Mcg/act Aers (Albuterol Sulfate) .... Take 2 Puffs As Needed. 6)  Norvasc 5 Mg Tabs (Amlodipine Besylate) .... Take 1 Tablet By Mouth Once A Day 7)  Calcium 600/ D .... 2 Tabs Am 8)  Fosamax 35 Mg Tabs (Alendronate Sodium) .... Take One Tablet Weekly To Strengthen Bones.  Sit Up For 30 Minutes After Taking. 9)  Aspirin 325 Mg Tabs (Aspirin) .... Take 1 Tablet By Mouth Two Times A Day 10)  Peri-Colace 8.6-50 Mg Tabs (Sennosides-Docusate Sodium) .... Take One Tablet Once A Day. Stop If You Have Diarrhea. 11)  Iferex 150 150 Mg Caps (Polysaccharide Iron Complex) .... Take Once Daily 12)  Sertraline Hcl 25 Mg Tabs (Sertraline Hcl) .... Take One Half  Tablet Daily. 13)  Xanax 0.25 Mg Tabs (Alprazolam) .... Take One Tablet Two Times A Day As Needed For Throat Tightness or Anxiety. 14)  Vicodin 5-500 Mg Tabs  (Hydrocodone-Acetaminophen) .... Take One Tablet Every 4-6 Hours As Needed For Pain. 15)  Revlimid 15 Mg Caps (Lenalidomide) .... One Tablet Daily.  Allergies (verified): No Known Drug Allergies  Past History:  Past Medical History: Last updated: 09/09/2008 Cough ALKALINE PHOSPHATASE, ELEVATED (ICD-790.5) HYPERGLYCEMIA, FASTING (ICD-790.29) ADENOMATOUS COLONIC POLYP (ICD-211.3) DEPRESSION (ICD-311) DYSLIPIDEMIA (ICD-272.4) PREVENTIVE HEALTH CARE (ICD-V70.0) INSOMNIA-SLEEP DISORDER-UNSPEC (ICD-780.52) BRADYCARDIA (ICD-427.89) GUAIAC POSITIVE STOOL (ICD-578.1) TAKOTSUBO SYNDROME (ICD-429.83) BUNION, RIGHT FOOT (ICD-727.1) LEUKOCYTOSIS (ICD-288.8) S/P TAH +-RMVL TUBE +-RMVL OVARY (AOZ-30865) COPD (ICD-496) TOBACCO USE, QUIT (ICD-V15.82) HYPERTENSION (ICD-401.9) GERD (ICD-530.81)  Past Surgical History: Last updated: 03/27/2009 Hysterectomy. R hip surgery - plasmacytoma, performed at [facility] in 01/2009  Family History: Last updated: 09-24-08  Mother died in her 63s as probable stomach cancer.   Father died in his 60s of lung cancer.  She has a sister with breast   cancer.   Social History: Last updated: 09/19/2008 Quit smoking in 07/06 and no alcohol or illegal drugs.  She lives alone but has 6 children that she sees regularly.  6-7 grandkids.  Risk Factors: Alcohol Use: 0 (01/13/2010) Exercise: yes (01/13/2010)  Risk Factors: Smoking Status: quit (01/13/2010)  Family History: Reviewed history from 09/24/08 and no changes required.  Mother died in her 36s as probable stomach cancer.   Father died in his 18s of lung cancer.  She has a sister with breast   cancer.   Social History: Reviewed history from 09/19/2008 and no changes required. Quit smoking in 07/06 and no alcohol or illegal drugs.  She lives alone but has 6 children that she sees regularly.  6-7 grandkids.  Review of Systems       per HPI  Physical Exam  General:   Well-developed,well-nourished,in no acute distress; alert,appropriate and cooperative throughout examination Lungs:  normal respiratory effort and normal breath sounds.   Heart:  normal rate, regular rhythm, no murmur, no gallop, and no rub.   Abdomen:  soft, non-tender, normal bowel sounds, no guarding, and no rigidity.   Psych:  Oriented X3, memory intact for recent and remote, and withdrawn.     Impression & Recommendations:  Problem # 1:  DIARRHEA (ICD-787.91)  This is most likely secondary to her new chemo drug REVLIMID, it does cause diarrhea in approximately 30% of the patients. Other etiologies include but less likely in this pt's case are inflammatory diseases, IBD, viral, etc. I will start pt on low dose imodium and I have advised her to call me back if that does not help. I also advised her to see her oncologist and asks for recommendations as well.   Her updated medication list for this problem includes:    Imodium Advanced 2-125 Mg Tabs (Loperamide-simethicone) .Marland Kitchen... Take 1 tablet 2-3 times per day as needed for diarrhea  Problem # 2:  DEPRESSION (ICD-311) Well controlled on current regimen.  Her updated medication list for this problem includes:    Sertraline Hcl 25 Mg Tabs (Sertraline hcl) .Marland Kitchen... Take one half  tablet daily.    Xanax 0.25 Mg Tabs (Alprazolam) .Marland Kitchen... Take one tablet two times a day as needed for throat tightness or anxiety.  Problem # 3:  DYSLIPIDEMIA (ICD-272.4) At goal, will cont the same regimen.  Her updated medication list for this problem includes:    Zocor 20 Mg Tabs (Simvastatin) .Marland Kitchen... Take 1 tab by mouth at bedtime  Labs Reviewed: SGOT: 19 (09/03/2009)   SGPT: 19 (09/03/2009)  Prior 10 Yr Risk Heart Disease: 5 % (03/27/2009)   HDL:54 (09/03/2009), 67 (07/09/2008)  LDL:55 (09/03/2009), 70 (07/09/2008)  Chol:122 (09/03/2009), 154 (07/09/2008)  Trig:66 (09/03/2009), 87 (07/09/2008)  Complete Medication List: 1)  Lisinopril 40 Mg Tabs (Lisinopril)  .... Take 1 tablet by mouth once a day 2)  Omeprazole 20 Mg Cpdr (Omeprazole) .... Take one to two pills once a day 3)  Zocor 20 Mg Tabs (Simvastatin) .... Take 1 tab by mouth at bedtime 4)  Nitrostat 0.4 Mg Subl (Nitroglycerin) .... Place 1 tablet under tongue for chest pain. every 5 minutes for a maximum of 3 times. call doctor if pain persists. 5)  Proair Hfa 108 (90 Base) Mcg/act Aers (Albuterol sulfate) .... Take 2 puffs  as needed. 6)  Norvasc 5 Mg Tabs (Amlodipine besylate) .... Take 1 tablet by mouth once a day 7)  Calcium 600/ D  .... 2 tabs am 8)  Fosamax 35 Mg Tabs (Alendronate sodium) .... Take one tablet weekly to strengthen bones.  sit up for 30 minutes after taking. 9)  Aspirin 325 Mg Tabs (Aspirin) .... Take 1 tablet by mouth two times a day 10)  Peri-colace 8.6-50 Mg Tabs (Sennosides-docusate sodium) .... Take one tablet once a day. stop if you have diarrhea. 11)  Iferex 150 150 Mg Caps (Polysaccharide iron complex) .... Take once daily 12)  Sertraline Hcl 25 Mg Tabs (Sertraline hcl) .... Take one half  tablet daily. 13)  Xanax 0.25 Mg Tabs (Alprazolam) .... Take one tablet two times a day as needed for throat tightness or anxiety. 14)  Vicodin 5-500 Mg Tabs (Hydrocodone-acetaminophen) .... Take one tablet every 4-6 hours as needed for pain. 15)  Revlimid 15 Mg Caps (Lenalidomide) .... One tablet daily. 16)  Imodium Advanced 2-125 Mg Tabs (Loperamide-simethicone) .... Take 1 tablet 2-3 times per day as needed for diarrhea  Patient Instructions: 1)  Please schedule a follow-up appointment in 3 months. Prescriptions: IMODIUM ADVANCED 2-125 MG TABS (LOPERAMIDE-SIMETHICONE) take 1 tablet 2-3 times per day as needed for diarrhea  #60 x 1   Entered and Authorized by:   Mliss Sax MD   Signed by:   Mliss Sax MD on 01/13/2010   Method used:   Electronically to        [facility] (retail)       [address]       [place], [place]  [id number]        Ph: [phone]       Fax: [phone]   RxID:   [id number]    Prevention & Chronic Care Immunizations   Influenza vaccine: Fluvax 3+  (05/29/2008)   Influenza vaccine deferral: Deferred  (01/13/2010)    Tetanus booster: Not documented   Td booster deferral: Not indicated  (01/13/2010)    Pneumococcal vaccine: Not documented   Pneumococcal vaccine deferral: Not indicated  (01/13/2010)    H. zoster vaccine: Not documented   H. zoster vaccine deferral: Not indicated  (01/13/2010)  Colorectal Screening   Hemoccult: Negative  (01/30/2006)   Hemoccult action/deferral: Not indicated  (01/13/2010)    Colonoscopy:  Pathology:  Adenomatous polyp.          (08/17/2007)   Colonoscopy action/deferral: Repeat colonoscopy in 5 years.   (08/17/2007)   Colonoscopy due: 08/16/2012  Other Screening   Pap smear: Not documented   Pap smear action/deferral: Not indicated S/P hysterectomy  (09/03/2009)    Mammogram: ASSESSMENT: Negative - BI-RADS 1^MM DIGITAL SCREENING  (09/22/2009)   Mammogram action/deferral: Ordered  (09/03/2009)    DXA bone density scan: Not documented   DXA bone density action/deferral: Not indicated  (01/13/2010)   Smoking status: quit  (01/13/2010)  Lipids   Total Cholesterol: 122  (09/03/2009)   Lipid panel action/deferral: Lipid Panel ordered   LDL: 55  (09/03/2009)   LDL Direct: Not documented   HDL: 54  (09/03/2009)   Triglycerides: 66  (09/03/2009)    SGOT (AST): 19  (09/03/2009)   BMP action: Ordered   SGPT (ALT): 19  (09/03/2009)   Alkaline phosphatase: 135  (09/03/2009)   Total bilirubin: 0.5  (09/03/2009)    Lipid flowsheet reviewed?: Yes   Progress toward LDL goal: At goal  Hypertension   Last  Blood Pressure: 137 / 64  (01/13/2010)   Serum creatinine: 0.98  (09/03/2009)   BMP action: Ordered   Serum potassium 4.7  (09/03/2009)    Hypertension flowsheet reviewed?: Yes   Progress toward BP goal: At goal  Self-Management Support :    Personal Goals (by the next clinic visit) :      Personal blood pressure goal: 140/90  (03/27/2009)   Patient will work on the following items until the next clinic visit to reach self-care goals:     Medications and monitoring: take my medicines every day, bring all of my medications to every visit  (01/13/2010)     Eating: eat more vegetables, use fresh or frozen vegetables, eat fruit for snacks and desserts, limit or avoid alcohol  (01/13/2010)     Activity: take a 30 minute walk every day, take the stairs instead of the elevator, park at the far end of the parking lot  (01/13/2010)     Other: NOT ABLE TO DO FULL 30 MINUTES, BECAUSE OF SURGERY  (03/31/2009)    Hypertension self-management support: Written self-care plan, Education handout, Resources for patients handout  (01/13/2010)   Hypertension self-care plan printed.   Hypertension education handout printed    Lipid self-management support: Written self-care plan, Education handout, Resources for patients handout  (01/13/2010)   Lipid self-care plan printed.   Lipid education handout printed      Resource handout printed.

## 2010-07-23 NOTE — Progress Notes (Signed)
Summary: med refill/gp  Phone Note Refill Request Message from:  Patient on December 15, 2009 11:55 AM  Refills Requested: Medication #1:  NORVASC 5 MG TABS Take 1 tablet by mouth once a day  Method Requested: Electronic Initial call taken by: Chinita Pester RN,  December 15, 2009 11:55 AM  Follow-up for Phone Call        completed refill, thank you Tamari Redwine  Follow-up by: Mliss Sax MD,  December 15, 2009 1:36 PM    Prescriptions: NORVASC 5 MG TABS (AMLODIPINE BESYLATE) Take 1 tablet by mouth once a day  #30 x 11   Entered and Authorized by:   Mliss Sax MD   Signed by:   Mliss Sax MD on 12/15/2009   Method used:   Electronically to        [facility] [address] (retail)       [address]       [place], [place]  [id number]       Ph: [phone]       Fax: [phone]   RxID:   [id number]

## 2010-07-23 NOTE — Progress Notes (Signed)
Summary: refill/ [facility]  Phone Note Refill Request Message from:  Fax from Pharmacy on July 14, 2009 5:09 PM  Refills Requested: Medication #1:  SERTRALINE HCL 25 MG TABS Take one half tablet daily for one week   Last Refilled: 06/13/2009 Initial call taken by: Marin Roberts RN,  July 14, 2009 5:09 PM    New/Updated Medications: SERTRALINE HCL 25 MG TABS (SERTRALINE HCL) Take one tablet daily. Prescriptions: SERTRALINE HCL 25 MG TABS (SERTRALINE HCL) Take one tablet daily.  #32 x 6   Entered and Authorized by:   Elby Showers MD   Signed by:   Elby Showers MD on 07/14/2009   Method used:   Electronically to        [facility] [facility] (retail)       [address]       [place], [place]  [id number]       Ph: [phone]       Fax: [phone]   RxID:   [id number]

## 2010-07-23 NOTE — Letter (Signed)
Summary: [facility]  [facility]   Imported By: Lester Stagecoach 06/03/2010 09:22:40  _____________________________________________________________________  External Attachment:    Type:   Image     Comment:   External Document

## 2010-07-23 NOTE — Assessment & Plan Note (Signed)
Summary: checkup/pcp-magick/hla   Vital Signs:  Patient profile:   77 year old female Height:      65 inches (165.10 cm) Weight:      136.01 pounds (61.82 kg) BMI:     22.72 Temp:     97.1 degrees F (36.17 degrees C) oral Pulse rate:   43 / minute BP sitting:   139 / 74  (right arm) Cuff size:   regular  Vitals Entered By: Angelina Ok RN (May 01, 2010 2:29 PM) CC: Depression Is Patient Diabetic? No Pain Assessment Patient in pain? no      Nutritional Status BMI of 19 -24 = normal  Have you ever been in a relationship where you felt threatened, hurt or afraid?No   Does patient need assistance? Functional Status Self care Ambulation Normal Comments Pt is not taking the Vicodin. Only taking meds fro her Anxiety.  Check up.  Needs refills on her med.  Does she need to be on 325mg  of ASA or will the 81 mg be ok?  Does she need treatment for her low heartrate?   Primary Care Provider:  Elby Showers MD  CC:  Depression.  History of Present Illness: This is a 77 year old female with a history of plasmacytoma of the right femur sp prophylactic fixation and external beam radiation, takosubo syndrome,  COPD and HTN who presents for routine follow up.   Pt has no particular concerns today but would like to know if she can take 81 mg aspirin instead of 325mg  because she is concerned about her stomach even though she has not noted any black stools or blood in her stool.  Beyond that, the patient would simply like a lisinopril refill.   Pt was hospitalized back in September for constipation and has been on colase since that time and denies further constipation or diarrhea.  Pt denies any LE edema or chest pain but does have occasional palpitations, and SOB though she states that the SOB is secondary to anxiety and relieved by xanax.   Pt has been living a relatively healthy lifestyle, she is trying to eat a healthy diet, she walks a few hours almost every day.     Depression  History:      The patient denies a depressed mood most of the day and a diminished interest in her usual daily activities.        Comments:  Occassional.   Preventive Screening-Counseling & Management  Alcohol-Tobacco     Alcohol drinks/day: 0     Smoking Status: quit     Year Quit: 7/07  Allergies: No Known Drug Allergies  Past History:  Past Medical History: Last updated: 09/09/2008 Cough ALKALINE PHOSPHATASE, ELEVATED (ICD-790.5) HYPERGLYCEMIA, FASTING (ICD-790.29) ADENOMATOUS COLONIC POLYP (ICD-211.3) DEPRESSION (ICD-311) DYSLIPIDEMIA (ICD-272.4) PREVENTIVE HEALTH CARE (ICD-V70.0) INSOMNIA-SLEEP DISORDER-UNSPEC (ICD-780.52) BRADYCARDIA (ICD-427.89) GUAIAC POSITIVE STOOL (ICD-578.1) TAKOTSUBO SYNDROME (ICD-429.83) BUNION, RIGHT FOOT (ICD-727.1) LEUKOCYTOSIS (ICD-288.8) S/P TAH +-RMVL TUBE +-RMVL OVARY ([id number]) COPD (ICD-496) TOBACCO USE, QUIT (ICD-V15.82) HYPERTENSION (ICD-401.9) GERD (ICD-530.81)  Family History: Reviewed history from 09/09/2008 and no changes required.  Mother died in her 57s as probable stomach cancer.   Father died in his 83s of lung cancer.  She has a sister with breast   cancer.   Social History: Reviewed history from 09/19/2008 and no changes required. Quit smoking in 07/06 and no alcohol or illegal drugs.  She lives alone but has 6 children that she sees regularly.  6-7 grandkids.  Review of Systems  Patient denies any CP, change in vision, black stools or blood in her stools, diarrhea or constipation.   Physical Exam  General:  alert and well-hydrated.   Head:  normocephalic.   Eyes:  vision grossly intact, pupils equal, pupils round, and pupils reactive to light.   Nose:  no nasal discharge.   Mouth:  pharynx pink and moist.   Neck:  supple.   Lungs:  normal respiratory effort, normal breath sounds, no crackles, and no wheezes.   Heart:  bradycardia and irregular rhythm.  No murmurs, gallops or rubs.  Abdomen:  soft,  non-tender, normal bowel sounds, and no distention.   Pulses:  2+ dp/pt pulses bilaterally Neurologic:  cranial nerves II-XII intact.     Impression & Recommendations:  Problem # 1:  ABNORMAL HEART RHYTHMS (ICD-427.9) On physical exam I noted an irregular rhythm which has not been previously documented.  An EKG was performed and the results discussed with cardiology.  It appears that the patient has an underlying bradycardia with frequent PAC's.  The patient is asymptomatic and denies any chest pain. She does have occasional palpitations but has a long history of this and they are not becoming more frequent.  I did check a BMET and Mag today to check for electrolyte abnormalities but otherwise will continue to monitor the patient.    Her updated medication list for this problem includes:    Aspirin 325 Mg Tabs (Aspirin) .Marland Kitchen... Take 1 tablet by mouth two times a day  Orders: T-Basic Metabolic Panel [id number]) T- * Misc. Laboratory test [id number]) 12 Lead EKG (12 Lead EKG)  Problem # 2:  DIARRHEA (ICD-787.91) Assessment: New This was felt to be overflow diarrhea secondary to constipation when the patient was hospitalized.  The patient has continued to use colase since her hospitalization and denies any current constipation or diarrhea.   Her updated medication list for this problem includes:    Imodium Advanced 2-125 Mg Tabs (Loperamide-simethicone) .Marland Kitchen... Take 1 tablet 2-3 times per day as needed for diarrhea  Problem # 3:  MULTIPLE  MYELOMA (ICD-203.00) This appears to be stable, pt denies any bone pain or other symptoms.  Ms. Manternach should continue to follow up with Dr. Clelia Croft.  Pt should also continue her bisphosphonate at this time.   Problem # 4:  HYPERTENSION (ICD-401.9) Patients BP was 139/74 today.  Will continue the current medications.   Her updated medication list for this problem includes:    Lisinopril 40 Mg Tabs (Lisinopril) .Marland Kitchen... Take 1 tablet by mouth once a day     Norvasc 5 Mg Tabs (Amlodipine besylate) .Marland Kitchen... Take 1 tablet by mouth once a day  Problem # 5:  MYOCARDIAL INFARCTION, ANTERIOR WALL, SUBSEQUENT CARE (ICD-410.12) Will continue patient on her 325 mg aspirin at this time as large studies have not showed an increased risk of bleeding in 325mg  aspirin vs. 81 mg. Pt currently denies any blood in stool or black stools or abdominal pain.   Her updated medication list for this problem includes:    Lisinopril 40 Mg Tabs (Lisinopril) .Marland Kitchen... Take 1 tablet by mouth once a day    Nitrostat 0.4 Mg Subl (Nitroglycerin) .Marland Kitchen... Place 1 tablet under tongue for chest pain. every 5 minutes for a maximum of 3 times. call doctor if pain persists.    Norvasc 5 Mg Tabs (Amlodipine besylate) .Marland Kitchen... Take 1 tablet by mouth once a day    Aspirin 325 Mg Tabs (Aspirin) .Marland Kitchen... Take 1 tablet by mouth two  times a day  Complete Medication List: 1)  Lisinopril 40 Mg Tabs (Lisinopril) .... Take 1 tablet by mouth once a day 2)  Omeprazole 20 Mg Cpdr (Omeprazole) .... Take one to two pills once a day 3)  Zocor 20 Mg Tabs (Simvastatin) .... Take 1 tab by mouth at bedtime 4)  Nitrostat 0.4 Mg Subl (Nitroglycerin) .... Place 1 tablet under tongue for chest pain. every 5 minutes for a maximum of 3 times. call doctor if pain persists. 5)  Proair Hfa 108 (90 Base) Mcg/act Aers (Albuterol sulfate) .... Take 2 puffs as needed. 6)  Norvasc 5 Mg Tabs (Amlodipine besylate) .... Take 1 tablet by mouth once a day 7)  Calcium 600/ D  .... 2 tabs am 8)  Fosamax 35 Mg Tabs (Alendronate sodium) .... Take one tablet weekly to strengthen bones.  sit up for 30 minutes after taking. 9)  Aspirin 325 Mg Tabs (Aspirin) .... Take 1 tablet by mouth two times a day 10)  Peri-colace 8.6-50 Mg Tabs (Sennosides-docusate sodium) .... Take one tablet once a day. stop if you have diarrhea. 11)  Iferex 150 150 Mg Caps (Polysaccharide iron complex) .... Take once daily 12)  Sertraline Hcl 25 Mg Tabs (Sertraline hcl)  .... Take one half  tablet daily. 13)  Xanax 0.25 Mg Tabs (Alprazolam) .... Take one tablet two times a day as needed for throat tightness or anxiety. 14)  Vicodin 5-500 Mg Tabs (Hydrocodone-acetaminophen) .... Take one tablet every 4-6 hours as needed for pain. 15)  Revlimid 15 Mg Caps (Lenalidomide) .... One tablet daily. 16)  Imodium Advanced 2-125 Mg Tabs (Loperamide-simethicone) .... Take 1 tablet 2-3 times per day as needed for diarrhea 17)  Klor-con M20 20 Meq Cr-tabs (Potassium chloride crys cr) .Marland Kitchen.. 1 tablet once daily  Patient Instructions: 1)  Please continue to live a healthy lifestyle and take your medications as prescribed. We will see you again in 1 month.  Prescriptions: LISINOPRIL 40 MG TABS (LISINOPRIL) Take 1 tablet by mouth once a day  #30 Tablet x 3   Entered and Authorized by:   Sinda Du MD   Signed by:   Sinda Du MD on 05/01/2010   Method used:   Print then Give to Patient   RxID:   [id number]    Orders Added: 1)  T-Basic Metabolic Panel [[id number]] 2)  T- * Misc. Laboratory test [99999] 3)  Est. Patient Level III [25366] 4)  12 Lead EKG [12 Lead EKG]   Immunization History:  Pneumovax Immunization History:    Pneumovax:  pneumovax (11/26/2007)   Immunization History:  Pneumovax Immunization History:    Pneumovax:  Pneumovax (11/26/2007)   Vital Signs:  Patient profile:   77 year old female Height:      65 inches (165.10 cm) Weight:      136.01 pounds (61.82 kg) BMI:     22.72 Temp:     97.1 degrees F (36.17 degrees C) oral Pulse rate:   43 / minute BP sitting:   139 / 74  (right arm) Cuff size:   regular  Vitals Entered By: Angelina Ok RN (May 01, 2010 2:29 PM)   Prevention & Chronic Care Immunizations   Influenza vaccine: Fluvax 3+  (05/29/2008)   Influenza vaccine deferral: Deferred  (01/13/2010)    Tetanus booster: Not documented   Td booster deferral: Not indicated  (01/13/2010)    Pneumococcal vaccine:  Pneumovax  (11/26/2007)   Pneumococcal vaccine deferral: Not indicated  (01/13/2010)  H. zoster vaccine: Not documented   H. zoster vaccine deferral: Not indicated  (01/13/2010)  Colorectal Screening   Hemoccult: Negative  (01/30/2006)   Hemoccult action/deferral: Not indicated  (01/13/2010)    Colonoscopy:  Pathology:  Adenomatous polyp.          (08/17/2007)   Colonoscopy action/deferral: Repeat colonoscopy in 5 years.   (08/17/2007)   Colonoscopy due: 08/16/2012  Other Screening   Pap smear: Not documented   Pap smear action/deferral: Not indicated S/P hysterectomy  (09/03/2009)    Mammogram: ASSESSMENT: Negative - BI-RADS 1^MM DIGITAL SCREENING  (09/22/2009)   Mammogram action/deferral: Ordered  (09/03/2009)    DXA bone density scan: Not documented   DXA bone density action/deferral: Not indicated  (01/13/2010)   Smoking status: quit  (05/01/2010)  Lipids   Total Cholesterol: 122  (09/03/2009)   Lipid panel action/deferral: Lipid Panel ordered   LDL: 55  (09/03/2009)   LDL Direct: Not documented   HDL: 54  (09/03/2009)   Triglycerides: 66  (09/03/2009)    SGOT (AST): 19  (09/03/2009)   BMP action: Ordered   SGPT (ALT): 19  (09/03/2009)   Alkaline phosphatase: 135  (09/03/2009)   Total bilirubin: 0.5  (09/03/2009)  Hypertension   Last Blood Pressure: 139 / 74  (05/01/2010)   Serum creatinine: 0.98  (09/03/2009)   BMP action: Ordered   Serum potassium 4.7  (09/03/2009)  Self-Management Support :   Personal Goals (by the next clinic visit) :      Personal blood pressure goal: 140/90  (03/27/2009)   Patient will work on the following items until the next clinic visit to reach self-care goals:     Medications and monitoring: take my medicines every day, bring all of my medications to every visit  (05/01/2010)     Eating: drink diet soda or water instead of juice or soda, eat more vegetables, use fresh or frozen vegetables, eat foods that are low in salt, eat  baked foods instead of fried foods, eat fruit for snacks and desserts, limit or avoid alcohol  (05/01/2010)     Activity: take a 30 minute walk every day  (05/01/2010)     Other: NOT ABLE TO DO FULL 30 MINUTES, BECAUSE OF SURGERY  (03/31/2009)    Hypertension self-management support: Written self-care plan, Education handout, Pre-printed educational material, Resources for patients handout  (05/01/2010)   Hypertension self-care plan printed.   Hypertension education handout printed    Lipid self-management support: Written self-care plan, Education handout, Pre-printed educational material, Resources for patients handout  (05/01/2010)   Lipid self-care plan printed.   Lipid education handout printed      Resource handout printed. v    Process Orders Check Orders Results:     [facility]: Order checked:     [phone] -- T- * Misc. Laboratory test -- No ABN rules found (CPT: ) Tests Sent for requisitioning (May 01, 2010 5:43 PM):     05/01/2010: [facility] -- T-Basic Metabolic Panel [phone] (signed)     05/01/2010: [facility] -- T- * Misc. Laboratory test [phone] (signed)    Appended Document: checkup/pcp-magick/hla I discussed patient with resident Dr. Cathey Endow and agree with plans as outlined above.

## 2010-07-23 NOTE — Miscellaneous (Signed)
Summary: hospital admission: abd pain  INTERNAL MEDICINE ADMISSION HISTORY AND PHYSICAL Date of admission 02/16/10  Attending: Dr. Meredith Pel Contacts: Dr. Odis Luster [phone]       Dr. Cena Benton [phone]  PCP: Dr. Aldine Contes  CC: Abdomenal pain  HPI: [phone] with pmh of multiple myeloma, CAD with NSTEMI, Takotsubo Syndrome and COPD presents with abdomenal pain.  She reports that for several months she has had on and off diarrhea and abdomenal cramping which she associats with taking revlimid.  Symptoms usually occur in the afternoon/evening about an hour after taking the pill.  Symptoms did not start until she had been taking the pill for several months. Usually she experiences a crampy abdomenal discomfort, then has several large bowel movments afterwhich the pain has resolved.  Yesterday she developed sharp abdomenal pain, much worse than her usual pain.  She then had two watery black bowel movments, but the pain persisted.  She states that the pain has been getting worse by the hour, which is why she has come to the hospital.  Currently the pain is 10/10.  Her entire abdomen is painful, but the worse pain originates in the RLQ and radiates around the right flank to the back. She also reports decreased oral intake for 2-3 days due to discomfort and nausea.  She has only vomited once, this morning, emesis was white and non bloody. She denies hematochesia, fevers or chills.  She has had  no dysuria, but states that the pain in her abdomen is so bad that it hurts to create enough pressure to uriniate.  No recent antibiotics or hospitalizations, no sick contacts.  ALLERGIES: NKDA  PAST MEDICAL HISTORY: Multiple Myeloma - presented with plasmacytoma in right hip treated at [facility] in [facility]2009 - [facility] Myocardial Infarction - NSTEMI in 11/2007 - cath 11/2007 with non obstructive CAD - followed by [facility] Dr. Daleen Squibb Takotsubo Syndrome - apical ballooning seen on cath  11/2007 Peripheral Arterial Disease - carotid ultrasound 11/2007 showed RCA 60-80% stenosed. LCA normal. COPD Anxiety/depression HTN HLD Question of HIT GERD ADENOMATOUS COLONIC POLYP   MEDICATIONS: LISINOPRIL 40 MG TABS (LISINOPRIL) Take 1 tablet by mouth once a day OMEPRAZOLE 20 MG  CPDR (OMEPRAZOLE) Take one to two pills once a day ZOCOR 20 MG  TABS (SIMVASTATIN) Take 1 tab by mouth at bedtime NITROSTAT 0.4 MG  SUBL (NITROGLYCERIN) Place 1 tablet under tongue for chest pain. PROAIR HFA 108 (90 BASE) MCG/ACT AERS (ALBUTEROL SULFATE) take 2 puffs as needed. NORVASC 5 MG TABS (AMLODIPINE BESYLATE) Take 1 tablet by mouth once a day * CALCIUM 600/ D 2 tabs am FOSAMAX 35 MG TABS (ALENDRONATE SODIUM) Take one tablet weekly to strengthen bones.   ASPIRIN 325 MG TABS (ASPIRIN) Take 1 tablet by mouth two times a day PERI-COLACE 8.6-50 MG TABS (SENNOSIDES-DOCUSATE SODIUM) Take one tablet once a day. Stop if you have diarrhea. IFEREX 150 150 MG CAPS (POLYSACCHARIDE IRON COMPLEX) Take once daily SERTRALINE HCL 25 MG TABS (SERTRALINE HCL) Take one half  tablet daily. XANAX 0.25 MG TABS (ALPRAZOLAM) Take one tablet two times a day as needed for throat tightness or anxiety. VICODIN 5-500 MG TABS (HYDROCODONE-ACETAMINOPHEN) Take one tablet every 4-6 hours as needed for pain. REVLIMID 15 MG CAPS (LENALIDOMIDE) One tablet daily. IMODIUM ADVANCED 2-125 MG TABS (LOPERAMIDE-SIMETHICONE) take 1 tablet 2-3 times per day as needed for diarrhea   SOCIAL HISTORY: Quit smoking in 07/06 and no alcohol or illegal drugs.   She  lives alone but has 6 children that she sees regularly.  6-7 grandkids.   FAMILY HISTORY Mother died in her 2s as probable stomach cancer.  Father died in his 1s of lung cancer.  She has a sister with breast cancer.    ROS: per hpi   VITALS: T:  P:  70   BP:  180/50  R:  O2SAT:  ON:  GEN: in moderate discomfort HEENT: pupils constricted and minimally reactive, conjunctiva  clear, MMdry, dentures in place, no oral lesions or erythema CV: RRR no mrg LUNGS: CTAB good air movment ABD: BS decreased, non distended, tender diffusely with gaurding, pain worse in RLQ EXT: no edema, 2+ pulses NEURO: AOx4, CN's intact,  strength and sensation intact and symetrical Rectal: pain in abdomen with rolling over, no pain with exam, no fissure or hemmroids, no obvious blood, stool is grey and watery.  LABS: WBC                                      5.5               4.0-10.5         K/uL  RBC                                      3.55       l      3.87-5.11        MIL/uL  Hemoglobin (HGB)                         11.5       l      12.0-15.0        g/dL  Hematocrit (HCT)                         34.2       l      36.0-46.0        %  MCV                                      96.3              78.0-100.0       fL  MCH -                                    32.4              26.0-34.0        pg  MCHC                                     33.7              30.0-36.0        g/dL  RDW                                      15.1  11.5-15.5        %  Platelet Count (PLT)                     142        l      150-400          K/uL  Sodium (NA)                              138               135-145          mEq/L  Potassium (K)                            3.2        l      3.5-5.1          mEq/L  Chloride                                 107               96-112           mEq/L  CO2                                      23                19-32            mEq/L  Glucose                                  153        h      70-99            mg/dL  BUN                                      9                 6-23             mg/dL  Creatinine                               0.82              0.4-1.2          mg/dL  GFR, Est Non African American            >60               >60              mL/min  GFR, Est African American                >60               >60              mL/min    Oversized  comment, see footnote  1  Bilirubin, Total  1.0               0.3-1.2          mg/dL  Alkaline Phosphatase                     110               39-117           U/L  SGOT (AST)                               68         h      0-37             U/L  SGPT (ALT)                               70         h      0-35             U/L  Total  Protein                           6.9               6.0-8.3          g/dL  Albumin-Blood                            3.9               3.5-5.2          g/dL  Calcium                                  8.7               8.4-10.5         mg/d   Lipase                                   21                11-59            U/L  Creatine Kinase, Total                   103               7-177            U/L  CK, MB                                   1.4               0.3-4.0          ng/mL  Relative Index                           1.4               0.0-2.5  Troponin I  0.03              0.00-0.06        ng/mL  Occult Blood, Fecal                      NEGATIVE  Color, Urine                             YELLOW            YELLOW  Appearance                               CLEAR             CLEAR  Specific Gravity                         1.009             1.005-1.030  pH                                       7.0               5.0-8.0  Urine Glucose                            NEGATIVE          NEG              mg/dL  Bilirubin                                NEGATIVE          NEG  Ketones                                  NEGATIVE          NEG              mg/dL  Blood                                    SMALL      a      NEG  Protein                                  30         a      NEG              mg/dL  Urobilinogen                             0.2               0.0-1.0          mg/dL  Nitrite  NEGATIVE          NEG  Leukocytes                               SMALL      a      NEG  Squamous  Epithelial / LPF                RARE              RARE  WBC / HPF                                0-2               <3               WBC/hpf  RBC / HPF                                0-2               <3               RBC/hpf  Bacteria / HPF                           FEW        a      RARE   Protime ( Prothrombin Time)              14.2              11.6-15.2        seconds  INR                                      1.08              0.00-1.49   CT agio chest IMPRESSION:    1.  Technically adequate study without pulmonary embolus.   2.  Cardiomegaly, as noted on prior plain film.   3.  Atherosclerosis and coronary artery disease.   4.  Emphysema and bronchiectasis.  CT abd pelvis  IMPRESSION:   1.  Pan colitis.  Primary differential considerations include C   difficile colitis versus inflammatory bowel disease.  Infectious   colitis other than C difficile possible. Ischemia unlikely based on   pancolonic distribution.   2.  Twisting of the mesenteric vessels as described above.  This is   of questionable clinical significance.  There is no vascular   compromise or bowel obstruction.   3.  Cholelithiasis.   4.  Hysterectomy.   5.  Tiny lesions adjacent to the SI joints favored to represent   degenerative subchondral cysts.  CXR   1.  New cardiomegaly.   2.  Nonobstructive bowel gas pattern.  ASSESSMENT AND PLAN: (1) Abd pain: Initial concern, with the intensity of pain is for bowel ischemia.  She has increased risk due to atherosclerosis and possibly due to hypergammaglobulinemia with MM as well as hypercoagulability.  CT abdomen also shows twisted mesentaric vessles.  Have rechecked FOBT.  Will consult gi for possible colonoscopy.  Will check a lactic acid level and consider a CT angio of the  abdomen.  Regular CT shows a pan colitis, which is consistent with her months of diarrhea.  This may be due to revlemid as she suspects, but will send stool for culture and ova and parasites and  check for c. diff. Morphine for pain.  (2) new cardiomegaly on CT and CXR: will check ECHO.  She does not show any signs of current failure.  (3) hypokalemia: repleat and check MG.  (4) elevated LFT's:  may be related to current symptoms.  Will check hepatitis panel.  Recheck LFT's in am to look for trend.  Note that there is cholylithiasis on CT abdomen but no mention of wall thickening and no leucocytosis or RUQ pain.  Will not get an abd [place] at this time.  (5) anemia and thrombocytopenia: likely due to multiple myeloma. Anemia is close to baseline.  Throbocytopenia is new.  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  (6) anxiety and depression: continue as needed xanax  (7) prophylaxis: SCD's.

## 2010-07-23 NOTE — Consult Note (Signed)
Summary: [facility]Radiation [facility]  [facility]   Imported By: Florinda Marker 07/30/2009 14:15:16  _____________________________________________________________________  External Attachment:    Type:   Image     Comment:   External Document

## 2010-07-23 NOTE — Letter (Signed)
[facility]   Imported By: Roderic Ovens 09/10/2009 11:07:00  _____________________________________________________________________  External Attachment:    Type:   Image     Comment:   External Document

## 2010-07-23 NOTE — Consult Note (Signed)
Summary: [facility]  [facility]   Imported By: Louretta Parma 05/19/2010 16:46:36  _____________________________________________________________________  External Attachment:    Type:   Image     Comment:   External Document

## 2010-07-23 NOTE — Consult Note (Signed)
Summary: [facility]  [facility]   Imported By: Margie Billet 06/10/2010 14:59:14  _____________________________________________________________________  External Attachment:    Type:   Image     Comment:   External Document

## 2010-07-23 NOTE — Consult Note (Signed)
Summary: Consultation Report  Consultation Report   Imported By: Gentry Fitz 03/13/2010 14:51:28  _____________________________________________________________________  External Attachment:    Type:   Image     Comment:   External Document

## 2010-07-23 NOTE — Assessment & Plan Note (Signed)
Summary: FU VISIT/DS   Vital Signs:  Patient profile:   77 year old female Height:      65 inches (165.10 cm) Weight:      137.5 pounds (62.50 kg) BMI:     22.96 Temp:     97.4 degrees F (36.33 degrees C) oral Pulse rate:   57 / minute BP sitting:   135 / 69  (left arm)  Vitals Entered By: Stanton Kidney Ditzler RN (June 16, 2010 10:40 AM) Is Patient Diabetic? No Pain Assessment Patient in pain? no      Nutritional Status BMI of 19 -24 = normal Nutritional Status Detail appetite good  Have you ever been in a relationship where you felt threatened, hurt or afraid?denies   Does patient need assistance? Functional Status Self care Ambulation Normal Comments FU - Pain in low center back standing on feet long time.   Primary Care Provider:  Elby Showers MD   History of Present Illness: 77 yo female with PMH outlined below presents to [facility] for regualr follow up. She has no concerns at the time except her chronic occasional lower back pain, sharp and only present on certain occasions when standing too long on her feet. She has had similar problem in the past for which she was taking pain medication and that has helped her. She denies any recent traumas, no specific alleviating or aggrevating problems. No recent sicknesses or hospitalizations, no episodes of chest pain, no abd or urinary concerns.   Depression History:      The patient denies a depressed mood most of the day and a diminished interest in her usual daily activities.  The patient denies significant weight loss, significant weight gain, insomnia, hypersomnia, psychomotor agitation, psychomotor retardation, fatigue (loss of energy), feelings of worthlessness (guilt), impaired concentration (indecisiveness), and recurrent thoughts of death or suicide.        The patient denies that she feels like life is not worth living, denies that she wishes that she were dead, and denies that she has thought about ending her life.      Preventive Screening-Counseling & Management  Alcohol-Tobacco     Alcohol drinks/day: 0     Smoking Status: quit     Year Quit: 7/07  Caffeine-Diet-Exercise     Does Patient Exercise: yes     Type of exercise: walking     Times/week: 3  Problems Prior to Update: 1)  Abnormal Heart Rhythms  (ICD-427.9) 2)  Abdominal Pain, Periumbilic  (ICD-789.05) 3)  Diarrhea  (ICD-787.91) 4)  ? of Heparin-induced Thrombocytopenia  (ICD-289.84) 5)  Multiple Myeloma  (ICD-203.00) 6)  Hip Pain, Right  (ICD-719.45) 7)  Sx of Dyspnea  (ICD-786.05) 8)  Edema  (ICD-782.3) 9)  Myocardial Infarction, Anterior Wall, Subsequent Care  (ICD-410.12) 10)  Alkaline Phosphatase, Elevated  (ICD-790.5) 11)  Hyperglycemia, Fasting  (ICD-790.29) 12)  Adenomatous Colonic Polyp  (ICD-211.3) 13)  Depression  (ICD-311) 14)  Dyslipidemia  (ICD-272.4) 15)  Preventive Health Care  (ICD-V70.0) 16)  Insomnia-sleep Disorder-unspec  (ICD-780.52) 17)  Bradycardia  (ICD-427.89) 18)  Guaiac Positive Stool  (ICD-578.1) 19)  Takotsubo Syndrome  (ICD-429.83) 20)  Bunion, Right Foot  (ICD-727.1) 21)  Leukocytosis  (ICD-288.8) 22)  S/P Tah +-rmvl Tube +-rmvl Ovary  (CPT-58150) 23)  COPD  (ICD-496) 24)  Tobacco Use, Quit  (ICD-V15.82) 25)  Hypertension  (ICD-401.9) 26)  Gerd  (ICD-530.81)  Medications Prior to Update: 1)  Lisinopril 40 Mg Tabs (Lisinopril) .... Take 1 Tablet By Mouth  Once A Day 2)  Omeprazole 20 Mg  Cpdr (Omeprazole) .... Take One To Two Pills Once A Day 3)  Zocor 20 Mg  Tabs (Simvastatin) .... Take 1 Tab By Mouth At Bedtime 4)  Nitrostat 0.4 Mg  Subl (Nitroglycerin) .... Place 1 Tablet Under Tongue For Chest Pain. Every 5 Minutes For A Maximum of 3 Times. Call Doctor If Pain Persists. 5)  Proair Hfa 108 (90 Base) Mcg/act Aers (Albuterol Sulfate) .... Take 2 Puffs As Needed. 6)  Norvasc 5 Mg Tabs (Amlodipine Besylate) .... Take 1 Tablet By Mouth Once A Day 7)  Calcium 600/ D .... 2 Tabs Am 8)  Fosamax 35  Mg Tabs (Alendronate Sodium) .... Take One Tablet Weekly To Strengthen Bones.  Sit Up For 30 Minutes After Taking. 9)  Aspirin 325 Mg Tabs (Aspirin) .... Take 1 Tablet By Mouth Two Times A Day 10)  Peri-Colace 8.6-50 Mg Tabs (Sennosides-Docusate Sodium) .... Take One Tablet Once A Day. Stop If You Have Diarrhea. 11)  Iferex 150 150 Mg Caps (Polysaccharide Iron Complex) .... Take Once Daily 12)  Sertraline Hcl 25 Mg Tabs (Sertraline Hcl) .... Take One Half  Tablet Daily. 13)  Xanax 0.25 Mg Tabs (Alprazolam) .... Take One Tablet Two Times A Day As Needed For Throat Tightness or Anxiety. 14)  Vicodin 5-500 Mg Tabs (Hydrocodone-Acetaminophen) .... Take One Tablet Every 4-6 Hours As Needed For Pain. 15)  Revlimid 15 Mg Caps (Lenalidomide) .... One Tablet Daily. 16)  Imodium Advanced 2-125 Mg Tabs (Loperamide-Simethicone) .... Take 1 Tablet 2-3 Times Per Day As Needed For Diarrhea 17)  Klor-Con M20 20 Meq Cr-Tabs (Potassium Chloride Crys Cr) .Marland Kitchen.. 1 Tablet Once Daily  Current Medications (verified): 1)  Lisinopril 40 Mg Tabs (Lisinopril) .... Take 1 Tablet By Mouth Once A Day 2)  Omeprazole 20 Mg  Cpdr (Omeprazole) .... Take One To Two Pills Once A Day 3)  Zocor 20 Mg  Tabs (Simvastatin) .... Take 1 Tab By Mouth At Bedtime 4)  Nitrostat 0.4 Mg  Subl (Nitroglycerin) .... Place 1 Tablet Under Tongue For Chest Pain. Every 5 Minutes For A Maximum of 3 Times. Call Doctor If Pain Persists. 5)  Proair Hfa 108 (90 Base) Mcg/act Aers (Albuterol Sulfate) .... Take 2 Puffs As Needed. 6)  Norvasc 5 Mg Tabs (Amlodipine Besylate) .... Take 1 Tablet By Mouth Once A Day 7)  Calcium 600/ D .... 2 Tabs Am 8)  Fosamax 35 Mg Tabs (Alendronate Sodium) .... Take One Tablet Weekly To Strengthen Bones.  Sit Up For 30 Minutes After Taking. 9)  Aspirin 325 Mg Tabs (Aspirin) .... Take 1 Tablet By Mouth Two Times A Day 10)  Peri-Colace 8.6-50 Mg Tabs (Sennosides-Docusate Sodium) .... Take One Tablet Once A Day. Stop If You Have  Diarrhea. 11)  Iferex 150 150 Mg Caps (Polysaccharide Iron Complex) .... Take Once Daily 12)  Sertraline Hcl 25 Mg Tabs (Sertraline Hcl) .... Take One Half  Tablet Daily. 13)  Xanax 0.25 Mg Tabs (Alprazolam) .... Take One Tablet Two Times A Day As Needed For Throat Tightness or Anxiety. 14)  Vicodin 5-500 Mg Tabs (Hydrocodone-Acetaminophen) .... Take One Tablet Every 4-6 Hours As Needed For Pain. 15)  Revlimid 15 Mg Caps (Lenalidomide) .... One Tablet Daily. 16)  Imodium Advanced 2-125 Mg Tabs (Loperamide-Simethicone) .... Take 1 Tablet 2-3 Times Per Day As Needed For Diarrhea 17)  Klor-Con M20 20 Meq Cr-Tabs (Potassium Chloride Crys Cr) .Marland Kitchen.. 1 Tablet Once Daily  Allergies (verified): No Known  Drug Allergies  Past History:  Past Medical History: Last updated: 09-27-2008 Cough ALKALINE PHOSPHATASE, ELEVATED (ICD-790.5) HYPERGLYCEMIA, FASTING (ICD-790.29) ADENOMATOUS COLONIC POLYP (ICD-211.3) DEPRESSION (ICD-311) DYSLIPIDEMIA (ICD-272.4) PREVENTIVE HEALTH CARE (ICD-V70.0) INSOMNIA-SLEEP DISORDER-UNSPEC (ICD-780.52) BRADYCARDIA (ICD-427.89) GUAIAC POSITIVE STOOL (ICD-578.1) TAKOTSUBO SYNDROME (ICD-429.83) BUNION, RIGHT FOOT (ICD-727.1) LEUKOCYTOSIS (ICD-288.8) S/P TAH +-RMVL TUBE +-RMVL OVARY (EAV-40981) COPD (ICD-496) TOBACCO USE, QUIT (ICD-V15.82) HYPERTENSION (ICD-401.9) GERD (ICD-530.81)  Past Surgical History: Last updated: 03/27/2009 Hysterectomy. R hip surgery - plasmacytoma, performed at [facility] in 01/2009  Family History: Last updated: 27-Sep-2008  Mother died in her 20s as probable stomach cancer.   Father died in his 27s of lung cancer.  She has a sister with breast   cancer.   Social History: Last updated: 09/19/2008 Quit smoking in 07/06 and no alcohol or illegal drugs.  She lives alone but has 6 children that she sees regularly.  6-7 grandkids.  Risk Factors: Alcohol Use: 0 (06/16/2010) Exercise: yes (06/16/2010)  Risk Factors: Smoking Status: quit  (06/16/2010)  Family History: Reviewed history from 2008/09/27 and no changes required.  Mother died in her 43s as probable stomach cancer.   Father died in his 41s of lung cancer.  She has a sister with breast   cancer.   Social History: Reviewed history from 09/19/2008 and no changes required. Quit smoking in 07/06 and no alcohol or illegal drugs.  She lives alone but has 6 children that she sees regularly.  6-7 grandkids.  Review of Systems       per HPI  Physical Exam  General:  Well-developed,well-nourished,in no acute distress; alert,appropriate and cooperative throughout examination Lungs:  normal respiratory effort, normal breath sounds, no crackles, and no wheezes.   Heart:  bradycardia and irregular rhythm.  No murmurs, gallops or rubs.  Abdomen:  soft, non-tender, normal bowel sounds, and no distention.     Detailed Back/Spine Exam  Lumbosacral Exam:  Inspection-deformity:    Normal Palpation-spinal tenderness:  Normal Lying Straight Leg Raise:    Right:  negative    Left:  negative Sitting Straight Leg Raise:    Right:  negative    Left:  negative Reverse Straight Leg Raise:    Right:  negative    Left:  negative Contralateral Straight Leg Raise:    Right:  negative    Left:  negative Sciatic Notch:    There is no sciatic notch tenderness.   Impression & Recommendations:  Problem # 1:  HIP PAIN, RIGHT (ICD-719.45) No specific findings on physical exam but pt did report she is not in pain at the moment. Will just cont her previous medication since it has been well controlled on it.  Her updated medication list for this problem includes:    Aspirin 325 Mg Tabs (Aspirin) .Marland Kitchen... Take 1 tablet by mouth two times a day    Vicodin 5-500 Mg Tabs (Hydrocodone-acetaminophen) .Marland Kitchen... Take one tablet every 4-6 hours as needed for pain.  Problem # 2:  HYPERTENSION (ICD-401.9) At goal, will cont the same regimen.  Her updated medication list for this problem  includes:    Lisinopril 40 Mg Tabs (Lisinopril) .Marland Kitchen... Take 1 tablet by mouth once a day    Norvasc 5 Mg Tabs (Amlodipine besylate) .Marland Kitchen... Take 1 tablet by mouth once a day  BP today: 135/69 Prior BP: 139/74 (05/01/2010)  Prior 10 Yr Risk Heart Disease: 5 % (03/27/2009)  Labs Reviewed: K+: 4.6 (05/01/2010) Creat: : 0.84 (05/01/2010)   Chol: 122 (09/03/2009)   HDL: 54 (09/03/2009)  LDL: 55 (09/03/2009)   TG: 66 (09/03/2009)  Complete Medication List: 1)  Lisinopril 40 Mg Tabs (Lisinopril) .... Take 1 tablet by mouth once a day 2)  Omeprazole 20 Mg Cpdr (Omeprazole) .... Take one to two pills once a day 3)  Zocor 20 Mg Tabs (Simvastatin) .... Take 1 tab by mouth at bedtime 4)  Nitrostat 0.4 Mg Subl (Nitroglycerin) .... Place 1 tablet under tongue for chest pain. every 5 minutes for a maximum of 3 times. call doctor if pain persists. 5)  Proair Hfa 108 (90 Base) Mcg/act Aers (Albuterol sulfate) .... Take 2 puffs as needed. 6)  Norvasc 5 Mg Tabs (Amlodipine besylate) .... Take 1 tablet by mouth once a day 7)  Calcium 600/ D  .... 2 tabs am 8)  Fosamax 35 Mg Tabs (Alendronate sodium) .... Take one tablet weekly to strengthen bones.  sit up for 30 minutes after taking. 9)  Aspirin 325 Mg Tabs (Aspirin) .... Take 1 tablet by mouth two times a day 10)  Peri-colace 8.6-50 Mg Tabs (Sennosides-docusate sodium) .... Take one tablet once a day. stop if you have diarrhea. 11)  Iferex 150 150 Mg Caps (Polysaccharide iron complex) .... Take once daily 12)  Sertraline Hcl 25 Mg Tabs (Sertraline hcl) .... Take one half  tablet daily. 13)  Xanax 0.25 Mg Tabs (Alprazolam) .... Take one tablet two times a day as needed for throat tightness or anxiety. 14)  Vicodin 5-500 Mg Tabs (Hydrocodone-acetaminophen) .... Take one tablet every 4-6 hours as needed for pain. 15)  Revlimid 15 Mg Caps (Lenalidomide) .... One tablet daily. 16)  Imodium Advanced 2-125 Mg Tabs (Loperamide-simethicone) .... Take 1 tablet 2-3  times per day as needed for diarrhea 17)  Klor-con M20 20 Meq Cr-tabs (Potassium chloride crys cr) .Marland Kitchen.. 1 tablet once daily  Patient Instructions: 1)  Please schedule a follow-up appointment in 3 months. 2)  Please check your blood pressure regularly, if it is >170 please call clinic at [phone] Prescriptions: VICODIN 5-500 MG TABS (HYDROCODONE-ACETAMINOPHEN) Take one tablet every 4-6 hours as needed for pain.  #60 x 3   Entered and Authorized by:   Mliss Sax MD   Signed by:   Mliss Sax MD on 06/16/2010   Method used:   Print then Give to Patient   RxID:   [id number] PERI-COLACE 8.6-50 MG TABS (SENNOSIDES-DOCUSATE SODIUM) Take one tablet once a day. Stop if you have diarrhea.  #30 x 5   Entered and Authorized by:   Mliss Sax MD   Signed by:   Mliss Sax MD on 06/16/2010   Method used:   Electronically to        [facility] (retail)       [address]       [place], [place]  [id number]       Ph: [phone]       Fax: [phone]   RxID:   [id number] FOSAMAX 35 MG TABS (ALENDRONATE SODIUM) Take one tablet weekly to strengthen bones.  Sit up for 30 minutes after taking.  #4 Tablet x 9   Entered and Authorized by:   Mliss Sax MD   Signed by:   Mliss Sax MD on 06/16/2010   Method used:   Electronically to        [facility] (retail)       [address]       [place], [place]  [id number]  Ph: [phone]       Fax: [phone]   RxID:   [id number] NORVASC 5 MG TABS (AMLODIPINE BESYLATE) Take 1 tablet by mouth once a day  #30 Tablet x 5   Entered and Authorized by:   Mliss Sax MD   Signed by:   Mliss Sax MD on 06/16/2010   Method used:   Electronically to        [facility] (retail)       [address]       [place], [place]  [address]       Ph: [phone]       Fax: [phone]   RxID:   [id number] PROAIR HFA 108 (90 BASE) MCG/ACT AERS (ALBUTEROL  SULFATE) take 2 puffs as needed.  #1 x 5   Entered and Authorized by:   Mliss Sax MD   Signed by:   Mliss Sax MD on 06/16/2010   Method used:   Electronically to        [facility] (retail)       [address]       [place], [place]  [id number]       Ph: [phone]       Fax: [phone]   RxID:   [id number] NITROSTAT 0.4 MG  SUBL (NITROGLYCERIN) Place 1 tablet under tongue for chest pain. Every 5 minutes for a maximum of 3 times. Call doctor if pain persists.  #25 Tablet x 5   Entered and Authorized by:   Mliss Sax MD   Signed by:   Mliss Sax MD on 06/16/2010   Method used:   Electronically to        [facility] (retail)       [address]       [place], [place]  [id number]       Ph: [phone]       Fax: [phone]   RxID:   [id number] ZOCOR 20 MG  TABS (SIMVASTATIN) Take 1 tab by mouth at bedtime  #30 Tablet x 7   Entered and Authorized by:   Mliss Sax MD   Signed by:   Mliss Sax MD on 06/16/2010   Method used:   Electronically to        [facility] (retail)       [address]       [place], [place]  [id number]       Ph: [phone]       Fax: [phone]   RxID:   [id number] LISINOPRIL 40 MG TABS (LISINOPRIL) Take 1 tablet by mouth once a day  #30 Tablet x 7   Entered and Authorized by:   Mliss Sax MD   Signed by:   Mliss Sax MD on 06/16/2010   Method used:   Electronically to        [facility] (retail)       [address]       [place], [place]  [id number]       Ph: [phone]       Fax: [phone]   RxID:   [id number]    Orders Added: 1)  Est. Patient Level III [[address]]     Prevention & Chronic Care Immunizations   Influenza vaccine: Fluvax 3+  (05/29/2008)   Influenza vaccine deferral: Deferred  (01/13/2010)    Tetanus booster: Not documented   Td booster deferral: Not indicated  (  01/13/2010)     Pneumococcal vaccine: Pneumovax  (11/26/2007)   Pneumococcal vaccine deferral: Not indicated  (01/13/2010)    H. zoster vaccine: Not documented   H. zoster vaccine deferral: Not indicated  (01/13/2010)  Colorectal Screening   Hemoccult: Negative  (01/30/2006)   Hemoccult action/deferral: Not indicated  (01/13/2010)    Colonoscopy:  Pathology:  Adenomatous polyp.          (08/17/2007)   Colonoscopy action/deferral: Repeat colonoscopy in 5 years.   (08/17/2007)   Colonoscopy due: 08/16/2012  Other Screening   Pap smear: Not documented   Pap smear action/deferral: Not indicated S/P hysterectomy  (09/03/2009)    Mammogram: ASSESSMENT: Negative - BI-RADS 1^MM DIGITAL SCREENING  (09/22/2009)   Mammogram action/deferral: Ordered  (09/03/2009)    DXA bone density scan: Not documented   DXA bone density action/deferral: Not indicated  (01/13/2010)   Smoking status: quit  (06/16/2010)  Lipids   Total Cholesterol: 122  (09/03/2009)   Lipid panel action/deferral: Lipid Panel ordered   LDL: 55  (09/03/2009)   LDL Direct: Not documented   HDL: 54  (09/03/2009)   Triglycerides: 66  (09/03/2009)    SGOT (AST): 19  (09/03/2009)   BMP action: Ordered   SGPT (ALT): 19  (09/03/2009)   Alkaline phosphatase: 135  (09/03/2009)   Total bilirubin: 0.5  (09/03/2009)    Lipid flowsheet reviewed?: Yes   Progress toward LDL goal: Improved  Hypertension   Last Blood Pressure: 135 / 69  (06/16/2010)   Serum creatinine: 0.84  (05/01/2010)   BMP action: Ordered   Serum potassium 4.6  (05/01/2010)    Hypertension flowsheet reviewed?: Yes   Progress toward BP goal: At goal  Self-Management Support :   Personal Goals (by the next clinic visit) :      Personal blood pressure goal: 140/90  (03/27/2009)   Patient will work on the following items until the next clinic visit to reach self-care goals:     Medications and monitoring: take my medicines every day, bring all of my medications to every  visit  (06/16/2010)     Eating: drink diet soda or water instead of juice or soda, eat more vegetables, use fresh or frozen vegetables, eat fruit for snacks and desserts, limit or avoid alcohol  (06/16/2010)     Activity: take a 30 minute walk every day, park at the far end of the parking lot  (06/16/2010)     Other: NOT ABLE TO DO FULL 30 MINUTES, BECAUSE OF SURGERY  (03/31/2009)    Hypertension self-management support: Written self-care plan, Education handout, Resources for patients handout  (06/16/2010)   Hypertension self-care plan printed.   Hypertension education handout printed    Lipid self-management support: Written self-care plan, Education handout, Resources for patients handout  (06/16/2010)   Lipid self-care plan printed.   Lipid education handout printed      Resource handout printed.

## 2010-07-23 NOTE — Discharge Summary (Signed)
Summary: Hospital Discharge Update    Hospital Discharge Update:  Date of Admission: 02/16/2010 Date of Discharge: 02/19/2010  Brief Summary:  The patient was admitted with diffuse abdominal pain and several months of intermittent diarrhea. Large amounts of impacted stool were observed on flexible sigmoidoscopy. Abdominal pain resolved after bowel prep.  Lab or other results pending at discharge:  None  Labs needed at follow-up: Basic metabolic panel  Other follow-up issues:  Patient was hypokalemic and hypomagnesemic during admission.  Problem list changes:  Added new problem of ABDOMINAL PAIN, PERIUMBILIC (ICD-789.05) - Signed  Medication list changes:  Added new medication of KLOR-CON M20 20 MEQ CR-TABS (POTASSIUM CHLORIDE CRYS CR) 1 tablet once daily - Signed Rx of KLOR-CON M20 20 MEQ CR-TABS (POTASSIUM CHLORIDE CRYS CR) 1 tablet once daily;  #31 x 3;  Signed;  Entered by: Bethel Born MD;  Authorized by: Bethel Born MD;  Method used: Electronically to [facility] [address] [address], [place], [place]  [id number], Ph: [phone], Fax: [phone]  The medication, problem, and allergy lists have been updated.  Please see the dictated discharge summary for details.  Discharge medications:  LISINOPRIL 40 MG TABS (LISINOPRIL) Take 1 tablet by mouth once a day OMEPRAZOLE 20 MG  CPDR (OMEPRAZOLE) Take one to two pills once a day ZOCOR 20 MG  TABS (SIMVASTATIN) Take 1 tab by mouth at bedtime NITROSTAT 0.4 MG  SUBL (NITROGLYCERIN) Place 1 tablet under tongue for chest pain. Every 5 minutes for a maximum of 3 times. Call doctor if pain persists. PROAIR HFA 108 (90 BASE) MCG/ACT AERS (ALBUTEROL SULFATE) take 2 puffs as needed. NORVASC 5 MG TABS (AMLODIPINE BESYLATE) Take 1 tablet by mouth once a day * CALCIUM 600/ D 2 tabs am FOSAMAX 35 MG TABS (ALENDRONATE SODIUM) Take one tablet weekly to strengthen bones.  Sit up for 30 minutes after taking. ASPIRIN 325 MG  TABS (ASPIRIN) Take 1 tablet by mouth two times a day PERI-COLACE 8.6-50 MG TABS (SENNOSIDES-DOCUSATE SODIUM) Take one tablet once a day. Stop if you have diarrhea. IFEREX 150 150 MG CAPS (POLYSACCHARIDE IRON COMPLEX) Take once daily SERTRALINE HCL 25 MG TABS (SERTRALINE HCL) Take one half  tablet daily. XANAX 0.25 MG TABS (ALPRAZOLAM) Take one tablet two times a day as needed for throat tightness or anxiety. VICODIN 5-500 MG TABS (HYDROCODONE-ACETAMINOPHEN) Take one tablet every 4-6 hours as needed for pain. REVLIMID 15 MG CAPS (LENALIDOMIDE) One tablet daily. IMODIUM ADVANCED 2-125 MG TABS (LOPERAMIDE-SIMETHICONE) take 1 tablet 2-3 times per day as needed for diarrhea KLOR-CON M20 20 MEQ CR-TABS (POTASSIUM CHLORIDE CRYS CR) 1 tablet once daily  Other patient instructions:  Your appointment with your doctor in outpatient clinic is on October 25th, at 4 pm Return to ED if severe abdominal pain, intractable vomiting, or blood in stools.   Note: Hospital Discharge Medications & Other Instructions handout was printed, one copy for patient and a second copy to be placed in hospital chart.

## 2010-07-23 NOTE — Consult Note (Signed)
[facility]: [facility]   Imported By: Florinda Marker 07/30/2009 14:16:24  _____________________________________________________________________  External Attachment:    Type:   Image     Comment:   External Document

## 2010-07-23 NOTE — Letter (Signed)
[facility]   Imported By: Roderic Ovens 10/13/2009 15:45:04  _____________________________________________________________________  External Attachment:    Type:   Image     Comment:   External Document

## 2010-07-23 NOTE — Letter (Signed)
Summary: [facility]  [facility]   Imported By: Margie Billet 10/21/2009 11:46:26  _____________________________________________________________________  External Attachment:    Type:   Image     Comment:   External Document

## 2010-07-23 NOTE — Letter (Signed)
[facility]  [facility]   Imported By: Margie Billet 12/08/2009 10:33:31  _____________________________________________________________________  External Attachment:    Type:   Image     Comment:   External Document

## 2010-07-23 NOTE — Progress Notes (Signed)
Summary: refill/ hla  Phone Note Refill Request Message from:  Pharmacy on April 17, 2010 5:18 PM  Refills Requested: Medication #1:  XANAX 0.25 MG TABS Take one tablet two times a day as needed for throat tightness or anxiety.   Dosage confirmed as above?Dosage Confirmed   Last Refilled: 8/16  Medication #2:  FOSAMAX 35 MG TABS Take one tablet weekly to strengthen bones.  Sit up for 30 minutes after taking. Initial call taken by: Marin Roberts RN,  April 17, 2010 5:20 PM  Follow-up for Phone Call        completed refill, thank you Iskra  Follow-up by: Mliss Sax MD,  April 20, 2010 8:34 AM    Prescriptions: Prudy Feeler 0.25 MG TABS (ALPRAZOLAM) Take one tablet two times a day as needed for throat tightness or anxiety.  #30 x 3   Entered and Authorized by:   Mliss Sax MD   Signed by:   Mliss Sax MD on 04/20/2010   Method used:   Telephoned to ...       [facility] [address] [address] [address] (retail)       [address]       [place], [place]  [id number]       Ph: [phone]       Fax: [phone]   RxID:   [id number] FOSAMAX 35 MG TABS (ALENDRONATE SODIUM) Take one tablet weekly to strengthen bones.  Sit up for 30 minutes after taking.  #4 Tablet x 10   Entered and Authorized by:   Mliss Sax MD   Signed by:   Mliss Sax MD on 04/20/2010   Method used:   Electronically to        [facility] (retail)       [address]       [place], [place]  [id number]       Ph: [phone]       Fax: [phone]   RxID:   [id number]

## 2010-08-18 NOTE — Progress Notes (Signed)
Summary: [facility]: Office Progress Note   Cancer Ctr: Office Progress Note   Imported By: Earl Many 08/12/2010 08:52:30  _____________________________________________________________________  External Attachment:    Type:   Image     Comment:   External Document

## 2010-08-21 ENCOUNTER — Other Ambulatory Visit: Payer: Self-pay | Admitting: Medical

## 2010-08-21 ENCOUNTER — Encounter (HOSPITAL_BASED_OUTPATIENT_CLINIC_OR_DEPARTMENT_OTHER): Payer: Medicare Other | Admitting: Oncology

## 2010-08-21 DIAGNOSIS — C9030 Solitary plasmacytoma not having achieved remission: Secondary | ICD-10-CM

## 2010-08-21 DIAGNOSIS — C9000 Multiple myeloma not having achieved remission: Secondary | ICD-10-CM

## 2010-08-21 LAB — COMPREHENSIVE METABOLIC PANEL
Alkaline Phosphatase: 102 U/L (ref 39–117)
BUN: 9 mg/dL (ref 6–23)
CO2: 28 mEq/L (ref 19–32)
Creatinine, Ser: 0.87 mg/dL (ref 0.40–1.20)
Glucose, Bld: 117 mg/dL — ABNORMAL HIGH (ref 70–99)
Total Bilirubin: 1.5 mg/dL — ABNORMAL HIGH (ref 0.3–1.2)
Total Protein: 6.4 g/dL (ref 6.0–8.3)

## 2010-08-21 LAB — CBC WITH DIFFERENTIAL/PLATELET
Basophils Absolute: 0 10*3/uL (ref 0.0–0.1)
Eosinophils Absolute: 0.1 10*3/uL (ref 0.0–0.5)
HCT: 34.5 % — ABNORMAL LOW (ref 34.8–46.6)
LYMPH%: 24.7 % (ref 14.0–49.7)
MCV: 95.4 fL (ref 79.5–101.0)
MONO#: 0.2 10*3/uL (ref 0.1–0.9)
MONO%: 6.2 % (ref 0.0–14.0)
NEUT#: 2.2 10*3/uL (ref 1.5–6.5)
NEUT%: 65.3 % (ref 38.4–76.8)
Platelets: 139 10*3/uL — ABNORMAL LOW (ref 145–400)
WBC: 3.4 10*3/uL — ABNORMAL LOW (ref 3.9–10.3)

## 2010-08-26 LAB — SPEP & IFE WITH QIG
Albumin ELP: 57 % (ref 55.8–66.1)
Alpha-1-Globulin: 5.7 % — ABNORMAL HIGH (ref 2.9–4.9)
IgM, Serum: 25 mg/dL — ABNORMAL LOW (ref 60–263)
Total Protein, Serum Electrophoresis: 6.5 g/dL (ref 6.0–8.3)

## 2010-08-26 LAB — KAPPA/LAMBDA LIGHT CHAINS
Kappa free light chain: 13.4 mg/dL — ABNORMAL HIGH (ref 0.33–1.94)
Kappa:Lambda Ratio: 6.12 — ABNORMAL HIGH (ref 0.26–1.65)
Lambda Free Lght Chn: 2.19 mg/dL (ref 0.57–2.63)

## 2010-09-03 LAB — CBC
HCT: 30.4 % — ABNORMAL LOW (ref 36.0–46.0)
MCH: 32.4 pg (ref 26.0–34.0)
MCV: 91 fL (ref 78.0–100.0)
Platelets: 142 10*3/uL — ABNORMAL LOW (ref 150–400)
RBC: 3.34 MIL/uL — ABNORMAL LOW (ref 3.87–5.11)
RBC: 3.55 MIL/uL — ABNORMAL LOW (ref 3.87–5.11)
RDW: 15.1 % (ref 11.5–15.5)
WBC: 3.6 10*3/uL — ABNORMAL LOW (ref 4.0–10.5)
WBC: 5.5 10*3/uL (ref 4.0–10.5)

## 2010-09-03 LAB — URINALYSIS, ROUTINE W REFLEX MICROSCOPIC
Bilirubin Urine: NEGATIVE
Ketones, ur: NEGATIVE mg/dL
Nitrite: NEGATIVE
Specific Gravity, Urine: 1.009 (ref 1.005–1.030)
Urobilinogen, UA: 0.2 mg/dL (ref 0.0–1.0)
pH: 7 (ref 5.0–8.0)

## 2010-09-03 LAB — BASIC METABOLIC PANEL
Chloride: 110 mEq/L (ref 96–112)
GFR calc Af Amer: >60 mL/min (ref >60)
GFR calc non Af Amer: >60 mL/min (ref >60)
Potassium: 3.2 mEq/L — ABNORMAL LOW (ref 3.5–5.1)
Sodium: 141 mEq/L (ref 135–145)

## 2010-09-03 LAB — COMPREHENSIVE METABOLIC PANEL
AST: 68 U/L — ABNORMAL HIGH (ref 0–37)
Albumin: 3.9 g/dL (ref 3.5–5.2)
BUN: 9 mg/dL (ref 6–23)
Chloride: 107 mEq/L (ref 96–112)
Creatinine, Ser: 0.82 mg/dL (ref 0.4–1.2)
GFR calc Af Amer: >60 mL/min (ref >60)
Potassium: 3.2 mEq/L — ABNORMAL LOW (ref 3.5–5.1)
Total Bilirubin: 1 mg/dL (ref 0.3–1.2)
Total Protein: 6.9 g/dL (ref 6.0–8.3)

## 2010-09-03 LAB — DIFFERENTIAL
Basophils Absolute: 0 10*3/uL (ref 0.0–0.1)
Eosinophils Relative: 1 % (ref 0–5)
Lymphocytes Relative: 14 % (ref 12–46)
Monocytes Absolute: 0.3 10*3/uL (ref 0.1–1.0)
Monocytes Relative: 6 % (ref 3–12)
Neutro Abs: 4.4 10*3/uL (ref 1.7–7.7)

## 2010-09-03 LAB — CARDIAC PANEL(CRET KIN+CKTOT+MB+TROPI)
CK, MB: 1.4 ng/mL (ref 0.3–4.0)
Total CK: 103 U/L (ref 7–177)
Troponin I: 0.03 ng/mL (ref 0.00–0.06)

## 2010-09-03 LAB — URINE CULTURE: Colony Count: 35000

## 2010-09-03 LAB — LIPASE, BLOOD: Lipase: 21 U/L (ref 11–59)

## 2010-09-03 LAB — URINE MICROSCOPIC-ADD ON

## 2010-09-03 LAB — SAMPLE TO BLOOD BANK

## 2010-09-03 LAB — PROTIME-INR: Prothrombin Time: 14.2 seconds (ref 11.6–15.2)

## 2010-09-03 LAB — HEMOCCULT GUIAC POC 1CARD (OFFICE): Fecal Occult Bld: NEGATIVE

## 2010-09-04 LAB — CBC
HCT: 30.1 % — ABNORMAL LOW (ref 36.0–46.0)
MCH: 31.2 pg (ref 26.0–34.0)
MCV: 90.5 fL (ref 78.0–100.0)
MCV: 91.2 fL (ref 78.0–100.0)
Platelets: 138 10*3/uL — ABNORMAL LOW (ref 150–400)
Platelets: 147 10*3/uL — ABNORMAL LOW (ref 150–400)
RBC: 3.58 MIL/uL — ABNORMAL LOW (ref 3.87–5.11)
RDW: 13.6 % (ref 11.5–15.5)
WBC: 3.4 10*3/uL — ABNORMAL LOW (ref 4.0–10.5)

## 2010-09-04 LAB — BASIC METABOLIC PANEL
BUN: 12 mg/dL (ref 6–23)
CO2: 25 mEq/L (ref 19–32)
Chloride: 109 mEq/L (ref 96–112)
Creatinine, Ser: 1.14 mg/dL (ref 0.4–1.2)
Potassium: 4.6 mEq/L (ref 3.5–5.1)

## 2010-09-04 LAB — COMPREHENSIVE METABOLIC PANEL
ALT: 43 U/L — ABNORMAL HIGH (ref 0–35)
AST: 29 U/L (ref 0–37)
Albumin: 3.2 g/dL — ABNORMAL LOW (ref 3.5–5.2)
Alkaline Phosphatase: 99 U/L (ref 39–117)
BUN: 6 mg/dL (ref 6–23)
Chloride: 102 mEq/L (ref 96–112)
Potassium: 3.3 mEq/L — ABNORMAL LOW (ref 3.5–5.1)
Sodium: 136 mEq/L (ref 135–145)
Total Bilirubin: 1.5 mg/dL — ABNORMAL HIGH (ref 0.3–1.2)
Total Protein: 5.9 g/dL — ABNORMAL LOW (ref 6.0–8.3)

## 2010-09-04 LAB — CLOSTRIDIUM DIFFICILE EIA: C difficile Toxins A+B, EIA: NEGATIVE

## 2010-09-04 LAB — HEPATITIS PANEL, ACUTE: HCV Ab: NEGATIVE

## 2010-09-04 LAB — STOOL CULTURE

## 2010-09-04 LAB — GIARDIA/CRYPTOSPORIDIUM SCREEN(EIA)
Cryptosporidium Screen (EIA): NEGATIVE
Giardia Screen - EIA: NEGATIVE

## 2010-09-04 LAB — LACTIC ACID, PLASMA: Lactic Acid, Venous: 1.1 mmol/L (ref 0.5–2.2)

## 2010-09-15 ENCOUNTER — Encounter: Payer: Medicare Other | Admitting: Internal Medicine

## 2010-09-24 LAB — DIFFERENTIAL
Basophils Absolute: 0.1 10*3/uL (ref 0.0–0.1)
Basophils Absolute: 0 10*3/uL (ref 0.0–0.1)
Basophils Relative: 2 % — ABNORMAL HIGH (ref 0–1)
Basophils Relative: 0 % (ref 0–1)
Eosinophils Absolute: 0 10*3/uL (ref 0.0–0.7)
Eosinophils Relative: 1 % (ref 0–5)
Eosinophils Relative: 0 % (ref 0–5)
Lymphocytes Relative: 28 % (ref 12–46)
Monocytes Absolute: 0.6 10*3/uL (ref 0.1–1.0)
Neutro Abs: 4.9 10*3/uL (ref 1.7–7.7)
Neutrophils Relative %: 80 % — ABNORMAL HIGH (ref 43–77)

## 2010-09-24 LAB — POCT CARDIAC MARKERS
Myoglobin, poc: 84.9 ng/mL (ref 12–200)
Troponin i, poc: <0.05 ng/mL (ref 0.00–0.09)

## 2010-09-24 LAB — BASIC METABOLIC PANEL
CO2: 23 mEq/L (ref 19–32)
Calcium: 9.1 mg/dL (ref 8.4–10.5)
Chloride: 101 mEq/L (ref 96–112)
Creatinine, Ser: 1.12 mg/dL (ref 0.4–1.2)
GFR calc Af Amer: 57 mL/min — ABNORMAL LOW (ref >60)
GFR calc Af Amer: >60 mL/min (ref >60)
GFR calc non Af Amer: >60 mL/min (ref >60)
Sodium: 133 mEq/L — ABNORMAL LOW (ref 135–145)
Sodium: 136 mEq/L (ref 135–145)

## 2010-09-24 LAB — CBC
HCT: 37.5 % (ref 36.0–46.0)
HCT: 34.8 % — ABNORMAL LOW (ref 36.0–46.0)
Hemoglobin: 11 g/dL — ABNORMAL LOW (ref 12.0–15.0)
Hemoglobin: 12.8 g/dL (ref 12.0–15.0)
MCHC: 34.1 g/dL (ref 30.0–36.0)
MCHC: 34.3 g/dL (ref 30.0–36.0)
MCV: 93.1 fL (ref 78.0–100.0)
MCV: 93.3 fL (ref 78.0–100.0)
Platelets: 204 10*3/uL (ref 150–400)
Platelets: 230 10*3/uL (ref 150–400)
Platelets: 230 10*3/uL (ref 150–400)
RBC: 3.45 MIL/uL — ABNORMAL LOW (ref 3.87–5.11)
RDW: 13.7 % (ref 11.5–15.5)
RDW: 13.8 % (ref 11.5–15.5)
RDW: 14.4 % (ref 11.5–15.5)
WBC: 5.9 10*3/uL (ref 4.0–10.5)
WBC: 7.4 10*3/uL (ref 4.0–10.5)

## 2010-09-24 LAB — CARDIAC PANEL(CRET KIN+CKTOT+MB+TROPI)
CK, MB: 0.9 ng/mL (ref 0.3–4.0)
CK, MB: 1 ng/mL (ref 0.3–4.0)
Relative Index: INVALID (ref 0.0–2.5)
Relative Index: INVALID (ref 0.0–2.5)
Relative Index: INVALID (ref 0.0–2.5)
Total CK: 43 U/L (ref 7–177)
Total CK: 54 U/L (ref 7–177)
Troponin I: 0.01 ng/mL (ref 0.00–0.06)
Troponin I: 0.04 ng/mL (ref 0.00–0.06)

## 2010-09-24 LAB — POCT I-STAT, CHEM 8
Calcium, Ion: 1.16 mmol/L (ref 1.12–1.32)
HCT: 35 % — ABNORMAL LOW (ref 36.0–46.0)
Hemoglobin: 11.9 g/dL — ABNORMAL LOW (ref 12.0–15.0)
Sodium: 132 mEq/L — ABNORMAL LOW (ref 135–145)
TCO2: 24 mmol/L (ref 0–100)

## 2010-09-24 LAB — D-DIMER, QUANTITATIVE: D-Dimer, Quant: 0.69 ug/mL-FEU — ABNORMAL HIGH (ref 0.00–0.48)

## 2010-09-24 LAB — BONE MARROW EXAM

## 2010-09-27 LAB — BASIC METABOLIC PANEL
BUN: 11 mg/dL (ref 6–23)
BUN: 22 mg/dL (ref 6–23)
CO2: 25 mEq/L (ref 19–32)
CO2: 31 mEq/L (ref 19–32)
Calcium: 8.6 mg/dL (ref 8.4–10.5)
Calcium: 9.7 mg/dL (ref 8.4–10.5)
Calcium: 9.8 mg/dL (ref 8.4–10.5)
Creatinine, Ser: 0.77 mg/dL (ref 0.4–1.2)
Creatinine, Ser: 0.81 mg/dL (ref 0.4–1.2)
Creatinine, Ser: 1.06 mg/dL (ref 0.4–1.2)
GFR calc Af Amer: >60 mL/min (ref >60)
GFR calc non Af Amer: 50 mL/min — ABNORMAL LOW (ref >60)
GFR calc non Af Amer: >60 mL/min (ref >60)
GFR calc non Af Amer: >60 mL/min (ref >60)
Glucose, Bld: 100 mg/dL — ABNORMAL HIGH (ref 70–99)
Glucose, Bld: 96 mg/dL (ref 70–99)
Sodium: 140 mEq/L (ref 135–145)
Sodium: 141 mEq/L (ref 135–145)

## 2010-09-27 LAB — CBC
HCT: 31.5 % — ABNORMAL LOW (ref 36.0–46.0)
Hemoglobin: 10.6 g/dL — ABNORMAL LOW (ref 12.0–15.0)
Hemoglobin: 11.4 g/dL — ABNORMAL LOW (ref 12.0–15.0)
Hemoglobin: 12.6 g/dL (ref 12.0–15.0)
MCHC: 33.7 g/dL (ref 30.0–36.0)
MCHC: 34.1 g/dL (ref 30.0–36.0)
MCV: 95.1 fL (ref 78.0–100.0)
Platelets: 193 10*3/uL (ref 150–400)
RBC: 3.31 MIL/uL — ABNORMAL LOW (ref 3.87–5.11)
RBC: 3.95 MIL/uL (ref 3.87–5.11)
RDW: 12.7 % (ref 11.5–15.5)
RDW: 12.7 % (ref 11.5–15.5)
RDW: 12.9 % (ref 11.5–15.5)

## 2010-09-27 LAB — DIFFERENTIAL
Basophils Absolute: 0 10*3/uL (ref 0.0–0.1)
Lymphocytes Relative: 26 % (ref 12–46)
Monocytes Absolute: 0.4 10*3/uL (ref 0.1–1.0)
Monocytes Relative: 5 % (ref 3–12)
Neutro Abs: 4.8 10*3/uL (ref 1.7–7.7)
Neutrophils Relative %: 67 % (ref 43–77)

## 2010-09-27 LAB — PROTIME-INR
INR: 1 (ref 0.00–1.49)
Prothrombin Time: 12.9 seconds (ref 11.6–15.2)

## 2010-09-27 LAB — CK TOTAL AND CKMB (NOT AT ARMC)
CK, MB: 1.4 ng/mL (ref 0.3–4.0)
Relative Index: 1.1 (ref 0.0–2.5)
Total CK: 133 U/L (ref 7–177)

## 2010-09-27 LAB — URINE MICROSCOPIC-ADD ON

## 2010-09-27 LAB — HEPATIC FUNCTION PANEL
ALT: 17 U/L (ref 0–35)
AST: 24 U/L (ref 0–37)
Albumin: 4.2 g/dL (ref 3.5–5.2)
Bilirubin, Direct: 0.2 mg/dL (ref 0.0–0.3)

## 2010-09-27 LAB — URINALYSIS, ROUTINE W REFLEX MICROSCOPIC
Hgb urine dipstick: NEGATIVE
Nitrite: NEGATIVE
Protein, ur: NEGATIVE mg/dL
Specific Gravity, Urine: 1.01 (ref 1.005–1.030)
Urobilinogen, UA: 0.2 mg/dL (ref 0.0–1.0)

## 2010-09-27 LAB — CARDIAC PANEL(CRET KIN+CKTOT+MB+TROPI): CK, MB: 1.3 ng/mL (ref 0.3–4.0)

## 2010-10-13 ENCOUNTER — Other Ambulatory Visit: Payer: Self-pay | Admitting: Oncology

## 2010-10-13 ENCOUNTER — Encounter (HOSPITAL_BASED_OUTPATIENT_CLINIC_OR_DEPARTMENT_OTHER): Payer: Medicare Other | Admitting: Oncology

## 2010-10-13 DIAGNOSIS — C9030 Solitary plasmacytoma not having achieved remission: Secondary | ICD-10-CM

## 2010-10-13 DIAGNOSIS — C9000 Multiple myeloma not having achieved remission: Secondary | ICD-10-CM

## 2010-10-13 DIAGNOSIS — J329 Chronic sinusitis, unspecified: Secondary | ICD-10-CM

## 2010-10-13 DIAGNOSIS — C7951 Secondary malignant neoplasm of bone: Secondary | ICD-10-CM

## 2010-10-13 LAB — CBC WITH DIFFERENTIAL/PLATELET
Eosinophils Absolute: 0.1 10*3/uL (ref 0.0–0.5)
HCT: 33.5 % — ABNORMAL LOW (ref 34.8–46.6)
LYMPH%: 25.2 % (ref 14.0–49.7)
MCV: 96 fL (ref 79.5–101.0)
MONO#: 0.4 10*3/uL (ref 0.1–0.9)
MONO%: 9.5 % (ref 0.0–14.0)
NEUT#: 2.7 10*3/uL (ref 1.5–6.5)
NEUT%: 62.5 % (ref 38.4–76.8)
Platelets: 177 10*3/uL (ref 145–400)
WBC: 4.4 10*3/uL (ref 3.9–10.3)

## 2010-10-13 LAB — COMPREHENSIVE METABOLIC PANEL
Alkaline Phosphatase: 82 U/L (ref 39–117)
BUN: 8 mg/dL (ref 6–23)
CO2: 29 mEq/L (ref 19–32)
Creatinine, Ser: 0.76 mg/dL (ref 0.40–1.20)
Glucose, Bld: 63 mg/dL — ABNORMAL LOW (ref 70–99)
Total Bilirubin: 0.9 mg/dL (ref 0.3–1.2)

## 2010-10-15 LAB — SPEP & IFE WITH QIG
Albumin ELP: 57.4 % (ref 55.8–66.1)
Beta 2: 5.5 % (ref 3.2–6.5)
Beta Globulin: 6.1 % (ref 4.7–7.2)
Gamma Globulin: 13.8 % (ref 11.1–18.8)
IgA: 204 mg/dL (ref 68–378)
IgG (Immunoglobin G), Serum: 821 mg/dL (ref 694–1618)

## 2010-10-15 LAB — KAPPA/LAMBDA LIGHT CHAINS
Kappa free light chain: 14.4 mg/dL — ABNORMAL HIGH (ref 0.33–1.94)
Kappa:Lambda Ratio: 8.67 — ABNORMAL HIGH (ref 0.26–1.65)
Lambda Free Lght Chn: 1.66 mg/dL (ref 0.57–2.63)

## 2010-11-03 NOTE — Discharge Summary (Signed)
Kayla Brooks, Kayla Brooks               ACCOUNT NO.:  [id number]   MEDICAL RECORD NO.:  [id number]          PATIENT TYPE:  INP   LOCATION:  4709                         FACILITY:  [facility]   PHYSICIAN:  Eliseo Gum, M.D.   DATE OF BIRTH:  June 28, 1929   DATE OF ADMISSION:  11/25/2007  DATE OF DISCHARGE:  11/30/2007                               DISCHARGE SUMMARY   DISCHARGE DIAGNOSES:  1. Chest pain, secondary to non-ST elevation myocardial infarction,      Takotsubo cardiomyopathy, ejection fraction of 50%, and 10% left      anterior descending lesion.  2. Hypokalemia, resolved.  3. Hypertension with orthostatic hypotension.  4. History of gastroesophageal reflux disease and esophageal      dysmotility.  5. Chronic obstructive pulmonary disease.  6. Anemia secondary to gastrointestinal bleed, fecal occult blood test      positive, history of colonic polyp, otherwise normal      esophagogastroduodenoscopy and colonoscopy in February 2009.  7. Right internal carotid artery stenosis 60%-80%.   DISCHARGE MEDICATIONS:  1. Lisinopril 10 mg once daily by mouth.  2. Lopressor 12.5 mg twice daily by mouth.  3. Omeprazole 20 mg once daily by mouth.  4. Zocor 20 mg once daily by mouth.  5. Albuterol MDI 2 puffs as required.  6. Nitrostat 0.4 mg one tablet below tongue for chest pain.  7. Colace 100 mg twice a day for constipation.   Please note that the patient's aspirin and naproxen have been  discontinued, and the patient is advised not to take NSAIDs.   DISPOSITION AND FOLLOWUP:  The patient is sent home in a stable  condition.  The patient will follow with [facility].  She has been given a followup appointment for January 06, 2008, at  3 o'clock.  At the followup visit, the patient's CBC will be checked to  monitor her hemoglobin.  Also, a repeat a BMET is advised at followup  visit to check on her hypokalemia as well as a response to lisinopril.  As mentioned  above, the patient has been diagnosed with Takotsubo  cardiomyopathy.  A repeat FOBT is recommended at the followup visit and  if negative, the patient's aspirin can be restarted at a lower dose.   STUDIES/PROCEDURES:  1. Chest x-ray November 25, 2007, stable mild cardiomegaly and mild changes      of COPD, no acute abnormality.  2. A 2-D echo November 26, 2007, overall left ventricular systolic function      was at the lower limit of normal, left ventricular ejection      fraction was estimated to be 50%.  There was akinesis of the mid      distal septal wall.  Left ventricular wall thickness was mildly      increased.  3. Carotid Doppler November 26, 2007, mild mixed plaque, origin and      proximal ICA.  Right 60%-80% ICA stenosis and left no ICA stenosis.      Vertebral artery flow is antegrade bilaterally.  4. Cardiac catheterization November 27, 2007, 10% proximal lesion  in LAD,      left ventricular ejection fraction 50%, mild decrease in left      ventricular systolic function, elevated end-diastolic pressure in      the left ventricle, with abnormal contraction pattern.   CONCLUSION:  Minimal coronary atherosclerosis.  There was apical  ballooning biventriculography consistent with Takotsubo cardiomyopathy.   CONSULTANTS:  Cardiology, Jonelle Sidle, MD   ADMISSION HISTORY AND PHYSICAL:  [name] is a 77 year old woman with  past medical history of hypertension, COPD, CVA, and GERD who presented  to ED complaining of chest pain on November 26, 2007.  The patient stated  that her chest pain started about 6:30 p.m. on the day of her admission  when she was at her hairdresser.  She had a sudden substernal 10/10  chest pain radiating to her back, pain localized on the chest wall,  associated with nausea, and vomited once in the ED.  She gave a history  of esophageal dysmotility in the past.  As per the patient, this pain  was different from her reflux pain.  She also mentioned dyspnea with PND   symptoms with chest pain.  She also mentioned of having had chest pain  in the substernal region with emotional stress or physical exertion.  The patient mentioned of dyspnea while lying on her side.  She denied  any diaphoresis.  Chest pain was persisting at the time the patient was  seen in the ED.  Her risk factors included her age, hypertension, and  history of smoking.  There is also history of diabetes and obesity, and  she was fairly active for her age.   ADMISSION PHYSICAL EXAMINATION:  VITAL SIGNS:  Temperature 97.9, blood  pressure 161/80, pulse 67, respiratory rate 18, and oxygen saturation  100% on room air.  GENERAL EXAM:  NAD.  EYES:  PERRL.  ENT: Clear.  NECK:  Supple.  Positive left carotid bruit, right no bruit.  No JVD.  CHEST:  Clear to auscultation bilaterally.  CARDIOVASCULAR:  Regular rate and rhythm, 2/6 systolic ejection murmur  in the parasternal region.  EXTREMITIES:  No edema.  Pulses slightly decreased in the lower  extremities but normal capillary refill.  GENITOURINARY:  Not examined.  SKIN:  No rashes.  LYMPH:  No adenopathy.  MUSCULOSKELETAL:  Moving all 4 limbs.  No swollen joints.  NEURO:  Nonfocal exam.  Cranial nerves II through XII intact.  Motor  5/5.  Sensory intact to light touch.  PSYCH EXAMINATION:  Appropriate.   ADMISSION LABS:  Sodium 129, potassium 2.9, chloride 97, bicarbonate 21,  BUN 15, creatinine 0.8, blood glucose 178, hemoglobin 11.6, WBC 9.3,  platelet 235, anion gap 11, bilirubin 1, alk phos 90, AST 23, ALT 13,  protein 6.7, albumin 3.6, calcium 8.9, troponin 0.1, repeat troponin  5.66 and 4.01.   HOSPITAL COURSE BY PROBLEM:  1. Chest pain.  The patient had EKG changes as well as elevation in      her cardiac enzymes.  The patient was placed in CCU and was started      on heparin as well as nitrate drip.  Cardiac consultation was      called for.  The patient was reviewed by a cardiologist who      continued the patient's  heparin and nitrate drip and also placed      her on Integrilin.  In addition to morphine, aspirin, and Plavix,      she was also started on  Lopressor and lisinopril as well as Zocor.      The patient underwent cardiac cath.  The results of which are      mentioned as above.  On further asking, the patient stated that the      chest pain started after an emotionally charged conversation with      her hairdresser.  The patient did have few episodes of chest pain      while in the hospital, responded to p.r.n. nitrates.  2. Hypokalemia.  This resolved upon repletion.  3. Hypertension.  This was controlled with Lopressor and lisinopril.  4. Chronic obstructive pulmonary disease.  The patient was placed on      p.r.n. nebs.  5. Anemia secondary to gastrointestinal bleeding.  The patient was      noted to be dropping her hemoglobin while in the hospital.  The      lowest hemoglobin she had was about 10.5 whereas she came in with a      level of 11.6, and FOBT was examined which came out to be positive.      Of note, the patient has had a recent normal EGD and colonoscopy      apart from colonic polyp.  Hence, the patient was simply monitored,      and her Plavix as well as aspirin was placed on hold.  On the day      of discharge, the patient's hemoglobin was about 12.5.  6. Positive carotid Doppler examination.  The patient was noted to      have carotid bruit along with positive Doppler examination of right      carotid with stenosis 60%-80%; however, the patient will be a very      good candidate for aspirin, but given FOBT positive, it was placed      on hold.  The patient's aspirin will have to be restarted on      outpatient basis in terms of reducing risk factors for possible      stroke.   DISCHARGE DAY LABS:  WBC 8.4, hemoglobin 12.5, MCV 91.3, platelet 210,  sodium 137, potassium 4.1, chloride 103, bicarbonate 25, blood glucose  91, BUN 12, calcium 9.2, and creatinine 0.8.    DISCHARGE DAY VITALS:  Temperature 97.5, pulse 48-76, respiratory rate  20, blood pressure 104/53, and oxygen saturation 100% on room air.   On the day of discharge, the patient was stable in terms of her vitals.  She was not complaining of any chest pain and she was able to move about  without assistance. Other than that her physical exam did not change  significantly compared to her admission physical.      Zara Council, MD  Electronically Signed      Eliseo Gum, M.D.  Electronically Signed    AS/MEDQ  D:  12/05/2007  T:  12/06/2007  Job:  [id number]

## 2010-11-03 NOTE — Consult Note (Signed)
NAMELUCIANNA, OSTLUND               ACCOUNT NO.:  [id number]   MEDICAL RECORD NO.:  [id number]          PATIENT TYPE:  OUT   LOCATION:  EKG                          FACILITY:  [facility]   PHYSICIAN:  Madolyn Frieze. Jens Som, MD, FACCDATE OF BIRTH:  [birth year]   DATE OF CONSULTATION:  02/09/2008  DATE OF DISCHARGE:                                 CONSULTATION   PRIMARY CARDIOLOGIST:  Jesse Sans. Wall, MD, [facility].   PRIMARY CARE Jacoba Cherney:  [facility].   PATIENT PROFILE:  A 77 year old African American female with prior  history of non-STEMI in the setting of normal coronary arteries and  Takotsubo's cardiomyopathy who presents with recurrent chest pain.   PROBLEMS:  1. Chest pain/nonobstructive coronary artery disease/questionable      coronary vasospasm.      a.     Non-ST elevation myocardial infarction, November 26, 2007.      b.     Cardiac catheterization, November 27, 2007, revealing       nonobstructive coronary artery disease with an ejection fraction       of 50% and apical ballooning.  2. Presumed Takotsubo's cardiomyopathy.      a.     November 26, 2007, 2D echocardiogram, ejection fraction 50%, mid       to distal septal akinesis, mild left ventricular hypertrophy.  3. Peripheral arterial disease.      a.     November 26, 2007, carotid ultrasound, right internal carotid       artery was 60%-80% stenosed while the left internal carotid artery       was normal.  There was antegrade flow in the vertebrals.  4. Hypertension.  5. Asymptomatic bradycardia.      a.     Carvedilol discontinued, July 2009.  6. Remote tobacco abuse.      a.     One hundred pack-year history, quitting 2 years ago.  7. Chronic obstructive pulmonary disease.  8. Gastroesophageal reflux disease.  9. History of colon polyps.  10.Hyperlipidemia.  11.Status post partial hysterectomy.   HISTORY OF PRESENT ILLNESS:  A 77 year old African American female with  prior history of frequent chest pain and recent  admission to [facility] in  June 2009, during which she ruled in for non-ST elevation MI and  underwent cardiac catheterization revealing nonobstructive coronary  artery disease with an EF of 50% and apical ballooning, suspicious for  Takotsubo's cardiomyopathy.  She was discharged on medical therapy and  returned in July 2009 with recurrent chest pain, at that point she ruled  out.  There was a question of coronary vasospasm and she was initiated  on nitrate therapy.  She was noted to be bradycardic at times with sinus  pauses up to 2.25 seconds while hospitalized and her carvedilol was  discontinued.  Since then, she is continued to have occasional chest  discomfort occurring with rest and exertion and occasionally associated  with dyspnea.  She thinks symptoms are similar to what she experienced  in June 2009.  She does occasionally have waterbrash associated with the  symptoms.  Symptoms typically  last for few minutes and resolve  spontaneously.  She notes occasional dizziness with position changes,  but otherwise denies any history of syncope.  There is no history of PND  or orthopnea.  She has occasional palpitations that get better when she  takes a deep breath.  There is a history of intermittent edema.   ALLERGIES:  BETA-BLOCKER causes bradycardia and pauses.   HOME MEDICATIONS:  1. Isosorbide dinitrate 10 mg t.i.d.  2. Lisinopril 20 mg daily.  3. Zocor 20 mg daily.  4. Prilosec 20 mg daily.  5. Norvasc 5 mg daily.  6. Aspirin 81 mg daily, added here.   FAMILY HISTORY:  Mother died of gastric cancer in her late 41s.  There  is some question as to whether or not she had heart problems.  Father  died of lung cancer is his late 23s.  She has a sister who is age 44  with a history of coronary disease.  She has 2 brothers and she is not  aware of their health status.   SOCIAL HISTORY:  She lives in [place] by herself.  She is retired.  She has 6 grown children.  She has a 100  pack-year history of tobacco  abuse, quitting 2 years ago.  She denies alcohol or drug use.  She is  not routinely exercising.   REVIEW OF SYSTEMS:  Positive for chest pain and dyspnea as outlined in  the HPI.  She has occasional palpitations as well as intermittent lower  extremity edema.  She does have some evidence of dizziness with position  changes, however, but denies syncope.  Otherwise, all systems reviewed  are negative.   PHYSICAL EXAMINATION:  VITAL SIGNS:  Temperature 97.0, heart rate 57,  respirations 18, blood pressure 215/90, and pulse ox 98% on room air.  GENERAL:  Pleasant African American female in no acute distress.  Awake,  alert, and oriented x3.  HEENT:  Normal.  Nares grossly intact, nonfocal.  SKIN:  Warm and dry without lesions or masses.  NECK:  Positive for soft right bruit.  No JVD.  LUNGS:  Respirations are unlabored.  Clear to auscultation.  CARDIAC:  Irregular.  S1 and S2.  No murmurs.  ABDOMEN:  Round, soft, nontender, and nondistended.  Bowel sounds  present x4.  EXTREMITIES:  Warm and dry.  No clubbing, cyanosis, or edema.  Dorsalis  pedis and posterior tibial pulses 2+ and equal bilaterally.  There is a  right femoral bruit.   Chest x-ray is pending.  EKG shows sinus bradycardia/sinus arrhythmia at  57 beats per minute, incomplete right bundle branch block.  Otherwise,  no acute ST-T changes.  Hemoglobin 11.2, hematocrit 33.6, WBC 6.1,  platelets 247.   ASSESSMENT AND PLAN:  1. Chest pain, typical and atypical symptoms.  Recent cardiac      catheterization in June 2009 showed a 10% left anterior descending      coronary artery stenosis and otherwise normal coronaries.  There is      a question of coronary vasospasm and she has been placed on nitrate      previously.  Agree with second cardiac markers.  Continue nitrates,      statin, and add low-dose aspirin (the patient apparently has a      history of fecal occult blood positive stool).  Not  pursue further      ischemic evaluation provided the cardiac enzymes remain negative.      Suspect her blood pressure may  be playing a role and we have to      question microvascular angina.  I agree with Norvasc therapy.  Beta-      blocker secondary baseline bradycardia.  Continue PPI.  2. Bradycardia.  Sinus arrhythmia without significant pauses and she      is not clearly symptomatic.  She has some orthostatic dizziness,      but no history of syncope.  Watch on telemetry and there did not      appear to be an indication for permanent pacemaker at this point.  3. Hypertension, poorly controlled.  Continue ACE and add calcium      channel blocker.  We will add p.r.n. hydralazine.  See #1.  4. Hyperlipidemia.  Continue statin therapy.      Nicolasa Ducking, ANP      Madolyn Frieze. Jens Som, MD, [facility]  Electronically Signed    CB/MEDQ  D:  02/09/2008  T:  02/10/2008  Job:  [id number]

## 2010-11-03 NOTE — Assessment & Plan Note (Signed)
[facility]                            CARDIOLOGY OFFICE NOTE   NAME:Brooks, Kayla KRAMME                      MRN:          [id number]  DATE:02/29/2008                            DOB:          December 31, 1929    Kayla Brooks returns today after being discharged from the hospital with  chest pain.  She had experienced a non-Q-wave myocardial infarction in  June 2009.  At that time, she underwent cardiac catheterization which  showed nonobstructive coronary artery disease with an apical wall motion  abnormality.  It was felt that she had Takotsubo Cardiomyopathy.  Remarkably, she was at the beauty shop under no stress when she had  chest pain.   This last hospitalization she ruled out for myocardial infarction.  She  was seen by [place] in consultation and was allowed to go home.   She had significant bradycardia down into the high 40s.  Her Carvedilol  was discontinued.   Since discharge she still has occasional palpitations but has had no  syncope or presyncope.  She had no chest pain or shortness of breath.   CURRENT MEDICATIONS:  1. Isosorbide dinitrate 10 mg p.o. t.i.d.  2. Lisinopril 20 mg a day.  3. Zocor 20 mg a day.  4. Amlodipine 5 mg a day.  5. Omeprazole 20 mg twice daily.  6. Ambien 5 mg nightly.  7. Sublingual nitroglycerin as needed.  8. Albuterol nebs.   PHYSICAL EXAMINATION:  VITAL SIGNS:  Her blood pressure is 142/63, her  pulse is 52 and regular.  EKG shows sinus brady with incomplete right  bundle.  There is no specific ST-segment changes.  I do not have an old  EKG to compare, however.  Weight is 134.  GENERAL:  She is very pleasant.  Alert and oriented x3.  SKIN:  Warm and dry.  HEENT:  Normal.  NECK:  Carotids upstrokes were equal bilaterally without bruits.  No  JVD.  Thyroid is not enlarged.  Trachea is midline.  LUNGS:  Clear.  HEART:  A regular rate and rhythm.  No gallop.  ABDOMEN:  Soft, good bowel sounds.  EXTREMITIES:  No  edema.  Pulses are intact.  NEURO:  Intact.   Kayla Brooks is doing well.  I have asked to continue with her current  medications.  We will see her back in about 6 weeks.     Thomas C. Daleen Squibb, MD, [facility]  Electronically Signed    TCW/MedQ  DD: 02/29/2008  DT: 03/01/2008  Job #: [id number]   cc:   Rufina Falco, M.D.  Elby Showers, MD

## 2010-11-03 NOTE — Assessment & Plan Note (Signed)
[facility]                            CARDIOLOGY OFFICE NOTE   NAME:Kayla Brooks, Kayla Brooks                      MRN:          [id number]  DATE:05/08/2008                            DOB:          Mar 29, 1930    Kayla Brooks returns today for further management of her Takotsubo  cardiomyopathy.  Please see my note from February 29, 2008, as well as  her discharge summary.   She has had no subsequent discomfort.  Her medicines unchanged since her  last visit.   PHYSICAL EXAMINATION:  VITAL SIGNS:  Her blood pressure is 124/70.  Her  pulse is 60 and regular.  Weight is 133.  HEENT:  Normal.  NECK:  Carotid upstrokes were equal bilateral without bruits.  No JVD.  Thyroid is not enlarged.  Trachea is midline.  LUNGS:  Clear.  HEART:  Regular rate and rhythm.  No gallop.  ABDOMEN:  Soft.  EXTREMITIES:  No edema.  Pulses are intact.  NEURO:  Intact.   I have asked Kayla Brooks to discontinue her isosorbide and continue with  amlodipine 10 mg a day.  She will continue with aspirin, of course,  simvastatin, as well as lisinopril.  She will carry sublingual  nitroglycerin and instructions on issues were reinforced.   We will plan on seeing her back again in January for 2-D echocardiogram  to hopefully see a full recovery of LV systolic function on 2-D  echocardiography.  I will see her back after that echo.     Thomas C. Daleen Squibb, MD, [facility]  Electronically Signed    TCW/MedQ  DD: 05/08/2008  DT: 05/09/2008  Job #: [id number]

## 2010-11-03 NOTE — Consult Note (Signed)
Kayla Brooks, Kayla Brooks NO.:  [id number]   MEDICAL RECORD NO.:  [id number]          PATIENT TYPE:  INP   LOCATION:  2909                         FACILITY:  [facility]   PHYSICIAN:  Unice Cobble, MD     DATE OF BIRTH:  [birth year]   DATE OF CONSULTATION:  DATE OF DISCHARGE:                                 CONSULTATION   CARDIOLOGIST:  Dr. Daleen Squibb in the past on similar consult.   CHIEF COMPLAINT:  Chest pain.   HISTORY OF PRESENT ILLNESS:  This is a 77 year old African American  female with a history of hypertension, COPD who presents with chest  pain.  Chest pain started at 1830 last night, approximately 10 hours ago  at her hair dresser.  It was 10/10 and radiating to the back.  Mostly  substernal, but also located in her left breast.  She had nausea and  vomiting in the emergency department.  No diaphoresis or shortness of  breath.  No palpitations.  Questionable presyncope.  Prior to this, she  had similar chest pain with exertion that was relieved with rest.  Pain  has persisted throughout the night and is now down to a 4/10.   PAST MEDICAL HISTORY:  1. Hypertension.  2. COPD.  3. GERD.  4. Myoview in 2007 showed a fixed septal defect without ischemia.  5. Colon polyps.  6. Status post hysterectomy.   ALLERGIES:  No known drug allergies.   MEDICATIONS:  1. Aspirin 81 mg daily.  2. Lisinopril 10 mg daily.  3. Omeprazole.  4. Allegra p.r.n.  5. Naproxen p.r.n.  6. Albuterol p.r.n.   SOCIAL HISTORY:  She lives alone and is widowed.  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  She has a 100 pack-year history, quitting 2 years  ago.  She states she never inhaled.  No alcohol.  No drugs.   FAMILY HISTORY:  Mother died in her 30s as probable stomach cancer.  Father died in his 31s of lung cancer.  She has a sister with breast  cancer.   REVIEW OF SYSTEMS:  Complete review of systems done and found to be  otherwise negative except as stated in the HPI.   PHYSICAL EXAMINATION:  VITAL SIGNS:  She is afebrile with a pulse of 58,  respiratory rate of 11, blood pressure 133/73, O2 sats are 100% on 2  liters.  GENERAL:  She is in no acute distress.  HEENT:  Shows PERRLA, EOMI, and MMM, oropharynx without erythema or  exudates.  NECK:  Supple without lymphadenopathy, thyromegaly, bruits, jugular  venous distention.  HEART:  Has regular rate and rhythm with normal S1-S2.  She has a 1/6  diastolic murmur in the left upper sternal border.  Pulses 2+ and equal  bilaterally without bruits.  LUNGS:  Clear to auscultation bilaterally without wheezes, rhonchi or  rales.  SKIN:  No rashes or lesions.  ABDOMEN:  Soft, nontender with normal bowel sounds.  No rebound or  guarding.  No hepatosplenomegaly.  EXTREMITIES:  Show no cyanosis, clubbing or edema.  MUSCULOSKELETAL:  Shows no  joint deformity effusions.  No spine or CVA  tenderness.  NEUROLOGICAL:  She is alert and oriented x3 with cranial nerves II-XII  grossly intact.  Strength 5/5 all extremities and axial groups normal  sensation throughout.   STUDIES:  Her current EKG shows a rate 62 in normal sinus rhythm.  There  is 1/2 mm ST elevation in V2 and 1 mm of ST elevation in V3.  She has T-  wave inversions in aVL, V1, V2.  She has left anterior fascicular block  and some left ventricular hypertrophy.  She has Q-waves in V1 and V2.   LABORATORY DATA:  Her white count is not elevated.  Potassium was 2.9 on  admission last night.  Creatinine is 0.8.  BNP is 163.  Troponin has  trended upward from 0.1-4.3.   ASSESSMENT/PLAN:  This is a 77 year old African American female with  multiple risk factors for coronary artery disease who presents with a  non-ST elevation MI.  The patient has Q-waves in her septal leads making  chronicity of her mild ST elevation difficult to discern.  Her T-waves  are clearly progressing with troponin positivity, and there is now ST  elevation of 1 mm in V3 only  without reciprocal changes.  It is possible  that she had an anteroseptal infarct last night which and is having  continuing pain due to this.  The EKG she received upon admission to the  hospital is concerning for elevation in the anteroseptal leads, but this  is gone now and she currently does not meet criteria for a STEMI.  I  will attempt to get her pain under control with increased nitroglycerin  drip, Integrilin and morphine.  If I have no results, then she will need  to be revascularized today.  Continue beta blocker, statin, aspirin, and  Plavix as well as heparin as prescribed.      Unice Cobble, MD  Electronically Signed     ACJ/MEDQ  D:  11/26/2007  T:  11/26/2007  Job:  [id number]

## 2010-11-03 NOTE — Discharge Summary (Signed)
Kayla Brooks, Kayla Brooks NO.:  [id number]   MEDICAL RECORD NO.:  [id number]          PATIENT TYPE:  INP   LOCATION:  5011                         FACILITY:  [facility]   PHYSICIAN:  Acey Lav, MD  DATE OF BIRTH:  21-Oct-1929   DATE OF ADMISSION:  01/24/2009  DATE OF DISCHARGE:  01/27/2009                               DISCHARGE SUMMARY   DISCHARGE DIAGNOSES:  1. Right intertrochanteric bone mass.  2. Urinary tract infection.  3. Acute renal injury.  4. Hypertension.  5. Gastroesophageal reflux disease.  6. Hyperlipidemia.  7. Chronic obstructive pulmonary disease.  8. History of non-ST-segment elevation myocardial infarction in June    2009.  1. Right internal carotid stenosis, approximately 60-80%  2. COPD   DISCHARGE MEDICATIONS:  1. Lisinopril 40 mg take 1 tablet p.o. once daily.  2. Pantoprazole 40 mg tablet take 1 tablet p.o. b.i.d.  3. Simvastatin 20 mg tablet take 1 tablet p.o. once daily.  4. Aspirin 81 mg take 1 tablet p.o. once daily.  5. Calcium carbonate (Os-Cal) 500 mg take 1 tablet p.o. t.i.d.  6. Norvasc 5 mg tablet take 1 tablet p.o. once daily.  7. Dulcolax 10 mg once daily.  8. Senna 1 tablet p.o. b.i.d.  9. Morphine sulfate 2 to 8 mg IV p.r.n. for pain.  10.Zofran 4 mg IV q.4 h. p.r.n. for nausea.  11.Albuterol inhaler.  12.Vicodin 5/325 take 1 tablet q.4 h. for pain.  13.Robaxin 500 mg take 1 tablet q.6 h. p.o. for spasms.  14.Ciprofloxacin 500 mg taken p.o. 1 tablet p.o. for 3 days.   DISPOSITION AND FOLLOWUP:  The patient will be transferred to [facility] for further management of bone mass under  care of orthopedic oncology with Dr. Sanda Linger and Dr. Elesa Massed.  Phone number is  [phone].   PROCEDURES:  1. CT of the head which showed no evidence of mass lesion.  2. CT of the chest which showed no lymphadenopathy.  3. CT of the abdomen which showed a small 9 mm lymph node seen in the      left paraaortic  space.  4. CT of the pelvis which showed large lesion in proximal right femur.  5. MRI of the right hip which showed large osteolytic lesion in the      right intertrochanteric femur which is most consistent with      malignancy, right hip osteoarthritis with small reactive effusion,      and right greater trochanteric bursitis.   CONSULTATIONS:  Orthopedic surgery was consulted - Dr. Magnus Ivan   HISTORY AND PHYSICAL:  Kayla Brooks is a 77 year old female with past  medical history of NSTEMI in 2007 with her last echo done this year  which was normal, hypertension, COPD and GERD.  She presented to the  urgent care center noting a 2-week history of right groin/hip pain.  The  pain is localized only to that area and is aggravated by weightbearing.  The pain gradually became more intolerable, and that is when she decided  to seek attention.  She denies pain elsewhere,  weight loss, fevers,  night sweats, nausea, vomiting and headaches.   PHYSICAL EXAMINATION:  RIGHT HIP:  Pertinent physical findings included  pain with movement on right hip.  CARDIOVASCULAR:  S1, S2 heard within normal limits, regular rate and  rhythm, no murmurs, gallops, or rubs heard.  CHEST:  Clear to auscultation bilaterally.  ABDOMEN:  Soft, nontender, nondistended, bowel sounds positive heard in  all 4 quadrants.  VITAL SIGNS ON ADMISSION:  Temperature 98.4, blood pressure 184/76,  pulse 71, respiratory rate 20, O2 saturation 99% on room air.   LABORATORIES ON ADMISSION:  Sodium 141, potassium 3, chloride 107,  bicarb 25, BUN 13, creatinine 0.8, glucose 106.  White blood cells 7.2,  hemoglobin 12.6.   X-ray of the right hip showed right hip mass.   HOSPITAL COURSE:  1. Primary right intertrochanteric bone lesion.  Based on      recommendations by orthopedic surgery, transferred patient to [facility] under the care of Dr. Sanda Linger and Dr.      Elesa Massed with orthopedic oncology.  During  admission, patient's pain      was managed by Vicodin and Robaxin, and she was advised not to bear      weight on the right side.  2. Urinary tract infection.  After checking the urine analysis,      patient was started on ciprofloxacin 500 mg tablet taken by mouth      once daily for 3 days.  Ciprofloxacin was started January 27, 2009.  3. Acute renal injury.  Creatinine increased from 0.77 to 1.06 today.      Lisinopril was held during hospital course.  BMET was checked daily      to monitor creatinine.  4. Hypertension.  Stable.  Held lisinopril 40 mg due to acute renal      injury and continued patient on Norvasc 5 mg tablet taken by mouth      once daily.  5. Hyperlipidemia.  Controlled.  Continued home medications.  6. GERD.  Controlled.  Patient received Protonix 40 mg twice daily      during hospital course.  7. Prophylaxis.  Patient received Lovenox 40 mg injections      subcutaneously.   DISCHARGE LABORATORIES AND VITALS:  Vitals on discharge:  Temperature  98, blood pressure 153/70, pulse 74, respiratory rate 18, O2 saturation  100% on room air.  Labs:  Sodium 139, potassium 4.4, chloride 102,  bicarb 31, BUN 22, creatinine 1.06, glucose 96, white blood cells 5.7,  hemoglobin 11.4, hematocrit 33.5 and platelets 193.      Kayla Quitter, MD  Electronically Signed      Acey Lav, MD  Electronically Signed    AS/MEDQ  D:  01/27/2009  T:  01/27/2009  Job:  [id number]

## 2010-11-03 NOTE — Discharge Summary (Signed)
Kayla Brooks, Kayla Brooks               ACCOUNT NO.:  [id number]   MEDICAL RECORD NO.:  [id number]          PATIENT TYPE:  INP   LOCATION:  4729                         FACILITY:  [facility]   PHYSICIAN:  Madaline Guthrie, M.D.    DATE OF BIRTH:  [birth year]   DATE OF ADMISSION:  12/30/2007  DATE OF DISCHARGE:  01/01/2008                               DISCHARGE SUMMARY   DISCHARGE DIAGNOSES:  1. Chest pain, likely secondary to coronary spasm.  2. Bradycardia.   SECONDARY DIAGNOSES:  1. Hypertension.  2. Chronic obstructive pulmonary disease.  3. Right internal carotid stenosis, approximately 60-80%.  4. Gastroesophageal reflux disease.  5. History of non-ST-segment elevation myocardial infarction in June      2009.   DISCHARGE MEDICATIONS:  1. Isosorbide dinitrate 10 mg p.o. t.i.d.  2. Lisinopril 20 mg p.o. daily.  3. Zocor 20 mg p.o. daily.  4. Omeprazole 20 mg p.o. daily.   Please note, the patient has had her Coreg discontinued due to her  tendency to become very bradycardic and have episodes of sinus pause on  this medicine.  Please avoid placing the patient on further beta-blocker  therapy.   PROCEDURES/DIAGNOSTIC TESTS:  A chest x-ray done on December 30, 2007, shows  cardiomegaly.  No pulmonary vascular congestion or active lung process.  Cardiac size appears slightly larger than noted on prior exam.   CONSULTATIONS:  None.   BRIEF ADMITTING HISTORY AND PHYSICAL:  The patient is a 77 year old  African American female with a past medical history of NSTEMI,  hypertension, and tobacco use, who presents with chest pain for  approximately 24 hours.  Last evening, the patient reports sudden onset  of tightness in her left breast, which radiated to the left scapula.  The patient reports positive shortness of breath and diaphoresis  associated with this chest discomfort.  This pain was improved with  sublingual nitroglycerin.  There is no associated nausea or vomiting  with this chest  pain.  The pain is nonpleuritic or positional in nature.  Earlier in the day, the patient experienced somewhat increased level of  fatigue and generalized weakness that was just similar to her baseline  fatigue.  Although her symptoms improved with the nitroglyerine, the  patient had some lingering discomfort and was encouraged to come to the  emergency department by her daughter.  The patient did not have any  chest pain at that time of exam.   PHYSICAL EXAMINATION:  GENERAL:  No acute distress.  The patient is  resting comfortably.  EYES:  Pupils are equal, round, and reactive to light.  Extraocular  movements intact.  ENT:  Oropharynx is clear.  NECK:  Supple with a mild right carotid bruit.  RESPIRATORY:  Clear to auscultation bilaterally.  No wheezes.  CARDIOVASCULAR:  Regular rate and rhythm.  No murmurs, rubs, or gallops.  GI:  Soft, nontender, and nondistended.  Positive bowel sounds.  EXTREMITIES:  No clubbing, cyanosis, or edema.  Full range of motion.  GU:  Deferred.  SKIN:  No rash.  Dry.  LYMPH:  No lymphadenopathy.  MENTAL STATUS:  Alert and oriented x3.  NEURO:  Nonfocal.  II through XII grossly intact.  PSYCH:  Appropriate.   ADMISSION VITAL SIGNS:  Temperature 97.5, blood pressure 153/77, pulse  70, respirations 16, and oxygen saturation of 100% on room air.   ADMISSION LABORATORY FINDINGS:  Sodium 131, potassium 4.1, chloride 107,  bicarb 25, BUN 15, creatinine 1.2, and glucose 107.  White blood cell  count 7.8 and hemoglobin 12.   Initial chest x-ray shows no acute cardiopulmonary findings and initial  cardiac enzyme panel is within normal limits.   HOSPITAL COURSE:  1. Chest pain.  The patient was admitted to the cardiac telemetry unit      for evaluation of the chest pain to rule out acute myocardial      infarction and acute coronary syndrome.  Her cardiac enzymes were      cycled and found to be within normal limits.  While on telemetry,      the patient  was noted to be quite bradycardic with the rate down to      below 40s.  She additionally had an episode of sinus pause      approximately 2.25 seconds that was noted on telemetry.  The      primary team promptly discontinued her Coreg.  The patient      continued to deny any symptoms of chest pain or shortness of      breath.  Her blood pressure remained stable, despite her      bradycardia.  The patient had been recently discharged from [facility] on December 05, 2007, with chest pain secondary to non-ST-      elevation myocardial infarction and probable Takotsubo      cardiomyopathy with preserved ejection fraction found on 2-D echo.      A cardiac catheterization during this hospitalization revealed a      10% blockage of the LAD.  The primary team decided to avoid further      invasive procedure, but rather manage her apparent symptoms of      coronary spasm with isosorbide dinitrate, which was started at 10      mg p.o. t.i.d.  As mentioned prior, the patient's Coreg was      discontinued and over the remaining time of her hospitalization,      the patient's heart rate responded accordingly.  The patient did      not have any further episodes of sinus pause that were noted on      telemetry.  The patient tolerated her isosorbide dinitrate without      any complaints of headache.  2. Hypertension.  Her blood pressure was well controlled on lisinopril      20 mg daily throughout her hospitalization.  3. GERD.  The patient did not have any episode of reflux while she was      maintained on Protonix 40 mg daily throughout her hospitalization.      She will be discharged on her home mediation, omeprazole 20 mg      daily.  4. Hypokalemia.  This resolved upon repletion and her potassium was      3.8 on the day of discharge.  5. Fatigue.  The patient's hemoglobin was 12.7 at the time of      admission.  This was consistent with her discharge hemoglobin on      December 05, 2007, of  12.7.   DISCHARGE VITAL SIGNS:  Temperature  97.3, pulse 64, blood pressure  131/48, respirations 20, and satting 93% on room air.   DISPOSITION/FOLLOWUP:  Kayla Brooks has been discharged from the  hospital on January 01, 2008, in stable and improved condition.  She has a  followup appointment with the [facility] on  January 06, 2008, at 3 o'clock.  At the followup visit, the patient's CBC  will need to be checked to monitor her hemoglobin.  Additionally, a BMET  is advised to follow up on the patient's hypokalemia as well as response  to her lisinopril.  As  mentioned prior, please avoid any further treatment with beta-blockers,  as the patient is susceptible to sinus pauses while on this mode of  therapy.   DISCHARGE LABS:  Sodium 134, potassium 3.8, chloride 99, bicarb 25, BUN  11, creatinine 0.95, and glucose 96.      Kayla Del, MD  Electronically Signed      Madaline Guthrie, M.D.  Electronically Signed    ZF/MEDQ  D:  01/01/2008  T:  01/02/2008  Job:  [id number]

## 2010-11-06 NOTE — Discharge Summary (Signed)
NAMEKELLER, Kayla Brooks NO.:  [id number]   MEDICAL RECORD NO.:  [id number]          PATIENT TYPE:  INP   LOCATION:  2004                         FACILITY:  [facility]   PHYSICIAN:  Mick Sell, MD DATE OF BIRTH:  10-Jun-1930   DATE OF ADMISSION:  02/09/2008  DATE OF DISCHARGE:  02/10/2008                               DISCHARGE SUMMARY   DISCHARGE DIAGNOSES:  1. Chest pain likely secondary to coronary spasm and possible      Takotsubo cardiomyopathy.  2. Bradycardia.  3. Hypertension.  4. Chronic obstructive pulmonary disease.  5. Gastroesophageal reflux disease.  6. Hyperlipidemia.  7. History of non-ST segment elevation myocardial infarction in June      2009.   DISCHARGE MEDICATIONS:  1. Isosorbide dinitrate 10 mg 3 times daily.  2. Lisinopril 20 mg daily.  3. Zocor 20 mg daily.  4. Amlodipine 5 mg daily.  5. Omeprazole 20 mg twice daily.  6. Ambien 5 mg nightly.  7. Nitroglycerin 0.4 mg sublingual as needed for chest pain.  8. Albuterol nebs 1-2 puffs every 4-6 hours as needed for shortness of      breath.  9. Phenergan 12.5 mg 1 pill every 6 hours as needed for nausea.   DISPOSITION AND FOLLOWUP:  The patient will follow up with the [facility].  They will call her for an appointment.  Her blood pressure will need to be followed up at this time.  She will  also follow up in 2-4 weeks with Dr. Daleen Squibb from [facility].  They  will contact her for an appointment as well.   PROCEDURES PERFORMED:  None.   CONSULTATIONS:  [facility], with whom this patient is usually  seen, was consulted to evaluate this patient for possibility of  pacemaker placement as well as for her chest pain.   BRIEF ADMITTING HISTORY AND PHYSICAL:  The patient is a 77 year old  female with a past medical history of bradycardia, Takotsubo  cardiomyopathy, COPD, hypertension, GERD, NSTEMI in June 2009, and right  internal carotid stenosis of  60-80%, who presents with chest pain for  about 2 hours.  She was at clinic this morning for a hospital followup  visit when she began having pain while in the waiting room.  The pain  was described as dull and located in the middle of the chest.  She took  4 baby aspirin while in the waiting room and did not experience any  relief.  She did have some dizziness associated with the chest pain, but  denies diaphoresis, nausea, vomiting, shortness of breath, or headache  during the episode.  She was last hospitalized for similar pain 1 month  ago.  A 2D echo on November 26, 2007, showed LVEF of 50% with akinesis of the  mid distal septal wall.  A cardiac catheterization in July showed 10%  blockage of the LAD and was otherwise normal.   PHYSICAL EXAM ON ADMISSION:  VITAL SIGNS:  Temperature 97.2, blood  pressure 204/93, pulse 70, respiratory rate 20, and oxygen saturation  100% on room air.  GENERAL:  Comfortable, in no apparent distress.  EYES:  Extraocular movements intact.  ENT:  Moist mucous membranes.  NECK:  Supple.  No JVD.  No carotid bruits.  RESPIRATORY:  Clear to auscultation bilaterally.  CARDIOVASCULAR:  Bradycardic, irregular with no murmurs, rubs, or  gallops.  ABDOMEN:  Soft, nontender, and nondistended.  Positive bowel sounds.  EXTREMITIES:  1+ pitting edema in the lower extremities bilaterally.  NEURO:  Nonfocal.  Alert and oriented x3.   LABS ON ADMISSION:  Sodium 140, potassium 3.6, chloride 106, CO2 28, BUN  9, creatinine 0.9, and glucose 80.  CK 133, CK-MB 1.2, and troponins  0.02.  BNP 131.   HOSPITAL COURSE:  1. Chest pain.  The patient was admitted to telemetry to rule out ACS      with serial EKGs and cycled cardiac enzymes, all of which were      negative.  The patient has had a recent clean catheterization as      well as 2D echo.  Cardiology was consulted to evaluate her chest      pain and recommended no further evaluation and cardiac enzymes were       negative.  The patient was continued on aspirin as well as twice      daily isosorbide dinitrate and p.r.n. nitroglycerin.  She did not      have any further episodes of chest pain during her admission.  2. Bradycardia.  The patient was bradycardic with a few episodes into      the low 40s.  During her admission, Cardiology was consulted for a      possibility of pacemaker placement.  Atropine was kept at the      bedside and LAD pads were kept on the patient in case of severe      bradycardia.  She was monitored on telemetry.  Cardiology      recommended no intervention at this time.  3. Hypertension.  The patient was significantly hypertensive on      admission with blood pressures in the 200 systolic.  Her home dose      of lisinopril at 20 mg was continued and amlodipine 5 mg was added.      On the second day of hospitalization, her blood pressures decreased      dramatically to systolics in the 120s.  She was discharged on 20 mg      of lisinopril daily and 5 mg of amlodipine daily.  Her blood      pressure will be followed up in the Outpatient Medicine Clinic.  4. GERD.  The patient's chest pain may have had some component of      reflux.  The patient's home dose of omeprazole was increased from      20 mg once daily to 20 mg twice daily, and the patient was      discharged on this dose.  5. COPD.  The patient did not report significant shortness of breath      during her admission and was discharged with an albuterol inhaler.   DAY OF DISCHARGE LABS AND VITALS:  Day of discharge vitals:  Temperature  97.9, blood pressure 123/65, pulse 42, respiratory rate 20, and oxygen  saturation 100% on room air.  Labs on the day of discharge:  Sodium 141,  potassium 3.7, chloride 107, CO2 27, BUN 11, creatinine 0.73, glucose  87, and calcium 9.3.  White blood cell count 6.8, hemoglobin of 11.3,  and platelets  242.  Cholesterol 127, triglycerides 48, HDL 58, and LDL  59.      Rufina Falco,  M.D.  Electronically Signed      Mick Sell, MD  Electronically Signed    JY/MEDQ  D:  02/12/2008  T:  02/13/2008  Job:  [id number]   cc:   Elby Showers, MD  Jesse Sans. Wall, MD, [facility]

## 2010-11-06 NOTE — Discharge Summary (Signed)
Kayla Brooks, Kayla Brooks               ACCOUNT NO.:  [id number]   MEDICAL RECORD NO.:  [id number]          PATIENT TYPE:  INP   LOCATION:  2006                         FACILITY:  [facility]   PHYSICIAN:  Thereasa Solo, M.D.  DATE OF BIRTH:  June 01, 1930   DATE OF ADMISSION:  01/04/2006  DATE OF DISCHARGE:  01/06/2006                                 DISCHARGE SUMMARY   DISCHARGE DIAGNOSES:  1.  Chronic obstructive pulmonary disease.  2.  Gastroesophageal reflux disease.  3.  Hypertension.  4.  Status post hysterectomy.  5.  Tobacco abuse.   DISCHARGE MEDICATIONS:  1.  HCTZ 25 mg p.o. daily.  2.  Cimetidine 40 mg p.o. b.i.d.  3.  Aspirin 325 mg p.o. daily.  4.  Nicotine patch 21 mg 1 patch daily.   DISCHARGE INSTRUCTIONS:  The patient is scheduled for follow up with Dr.  Thereasa Solo here at this time the [facility] outpatient clinic on August  ____________.   PROCEDURES:  1.  ____________ echocardiogram, which was normal with no left ventricular      motion abnormalities, a mild aortic valve calcification and mild      dilation of the left atrium.  2.  Myoview, which showed 63% EF and no evidence of ischemia  3.  Carotid Doppler, which shoed no ICA stenosis.   CONSULTATIONS:  1.  Thomas C. Wall, M.D.   ADMISSION HISTORY AND PHYSICAL:  The patient is a 77 year old woman, with  past medical history significant for hypertension, GERD, and tobacco abuse,  who presented with chest pain that started 2 days ago.  The chest pain  started in her left breast and the rate does not increase in intensity or  duration.  The pain is alleviated by remaining still, and the patient has  done nothing to aggravate the pain.   She has also complained of weakness and fatigue for a couple of weeks, as  well as a frontal headache that started 2 weeks ago.  The patient is rated  8/10 and only lasts for a few days at a time.   The patient also reports a recent cold and her only symptom a dry and  occasionally productive cough.  She denies any recent fevers or chills, or  any fatigue.   She denies shortness of breath, diaphoresis, abdominal pain, vomiting,  dysuria, increased urinary frequently, calf tenderness, diarrhea,  constipation, hematochezia, or fevers.   ALLERGIES:  No known drug allergies.   SOCIAL HISTORY:  Lives alone, but has good family support her in [place].   FAMILY HISTORY:  The mother in her 55s.  The patient's father died on lung  cancer in his late 80s.  A sister has breast cancer.  The patient has 6  children and the only significant history is a daughter who has lupus.   REVIEW OF SYSTEMS:  Besides those that are noted above in the HPI, the  patient has also complained of a mild nausea.   PHYSICAL EXAMINATION:  VITAL SIGNS:  Temperature is 98.6, blood pressure is  144/82, pulse is 66, respirations 20, O2 sat  100% on room air.  ENT:  No oropharyngeal erythema.  Nasal terminates are slightly erythematous  with a positive clear discharge.  NECK:  Supple.  No JVD.  No LAD.  RESPIRATORY:  Clear to auscultation bilaterally.  No crackles or no rhonchi.  CARDIOVASCULAR: Regular rate and rhythm with S1 and S2.  No murmurs, rubs,  or gallops.  GI:  Positive bowel sounds.  EXTREMITIES:  Nontender bilaterally upper and lower extremities.  NEURO:  Alert and oriented x3.  CN II-XII grossly intact.  Cerebellar  function intact.  PSYCH:  No history of or current suicidal ideations or homicidal ideations.   ADMISSION LABS:  The patient's initial x-ray, on January 04, 2006, showed COPD  and mild cardiac enlargement.  No acute abnormalities.  EKG showed sinus  bradycardia with a rate of 53 and RSR pattern in V1 suggesting right  ventricular conduction delays (this was also read as poor R-wave  progression).  Other labs:  Sodium was 133, potassium was 4.1, chloride was  97, bicarb was 25, BUN was 14, creatinine was 0.8, glucose was 94.  White  blood cell count of 7.0,  hemoglobin of 13.5, hematocrit of 41.1, platelets  were 228,000, ANC of 4.4, MCV of 97.9.  D-dimer was less than 0.22.  Magnesium was 2.0.  PT of 13.1, INR of 1.0, PTT of 29.0.  Bilirubin was 0.9,  alk phos was 118, AST was 33, ALT was 28, protein was 7.1, albumin 4.0,  calcium was 10.0.   HOSPITAL COURSE:  The patient was admitted to the internal medicine service  under Dr. Wyonia Hough, attending physician, with residents Dr. Sherlon Handing and Dr.  Michael Boston.  The patient was started on a normal saline drip 75 mL/h.  Cardiac enzymes q.8 h. x3 were taken, TSH and free T4, Orthostatics were  checked, UDS, 2-D echo, CBC with diff, B-met, fasting lipid profile, as well  as a repeat EKG.  The patient was started on HCTZ 25 mg p.o. daily, which  she normally took at home, aspirin 325 mg p.o. daily, nitroglycerine 0.4 mg  sublingually q.5 minutes x3 doses p.r.n. for chest pain, Protonix 40 mg p.o.  daily.  Lovenox 30 mg subcutaneous daily, Phenergan 12.5 mg intravenous or  p.o. q.6 h. p.r.n. for nausea, Tylenol 650 mg p.o. q.6 h. p.r.n. for pain,  ____________ 200 mg p.o. q.4 h. p.r.n. symptomatic cough, and Maalox 30 mL  p.o. p.r.n. for indigestion.   The patient was worked up for the chest pain primarily from a cardiac  standpoint at first.  We sent the patient for an echocardiogram the  following day after admission, which returned largely negative.  The  official impression of this echocardiogram was that there was no  echocardiographic evidence for a cardiac source of embolism.  It also showed  a mildly calcified aortic valve and a mildly dilated left atrium.  There  were no regional wall motion abnormalities in the left ventricle.   The platelets after the echo was to be:  If the echo returned negative then  we would proceed with a Myoview stress test to further evaluate the patient,  given her multiple cardiac risk factors including age, hypertension, and tobacco abuse.  If the echo returned  positive, we had planned to cath the  patient.  Cardiology was involved already at this point and they concurred  with our decision.  Subsequently, after the echocardiogram returned  negative, the patient was sent for a Myoview stress test.  It returned  with  63% EF, no evidence of ischemia, and no ICA stenosis was seen on carotic  Dopplers.  At this point cardiology notified us that the patient was clear  from their perspective for discharge.   We attributed the patient's chest pain to either complications to her COPD,  or due to the fact that she was not taking her Pepcid or famotidine at home.  The patient could have been feeling pain like symptoms from esophageal spasm  due to her history of GERD.   Also during the patient's stay she was noted to have some bradycardia, which  was 53 bpm on admission, and subsequent EKGs revealed 47 and 43 bpm, 43 was  the lowest heart rate she sustained during her admission.  The patient asked  me about occasions where she can become, as she said, dizzy, with near  syncopal episodes.  She said that these can occur when she arises suddenly  out of a sitting or lying position, as well as if she is standing in one  place for an extended period of time.  I explained to her as well as her  family that these symptoms may have been due to a low heart rate.  I advised  the patient to stay well hydrated, especially during these summer months,  and stay out of direct sun.  I also advised the patient to arise slowly from  any sitting or lying position and to be careful not to over exert herself.  She can be followed up due to his bradycardic rhythm when she presents to  the outpatient clinic.  It is also interesting to note that upon the time of  discharge her heart rate for the day had jumped up to 74 beats per minute.  This was a good sign and we felt comfortable with discharging the patient.  I did take the time to explain to her briefly that she could  receive a  pacemaker in the future due to this bradycardic rhythm.   The patient is also a chronic smoker with an extended pack year history, and  drinker.  In the patient's stay we placed the patient on a nicotine patch 21  mg daily.  We also consulted the smoking cessation counselors who came by  and discussed with the patient her options for quitting smoking when she  returns home.  I also gave the patient a prescription for the nicotine patch  at the above dosage for her to use at home.  She expressed some ideas about  quitting, although she didn't feel that she could quit at this time.  She  did say she was going to give it a try and realized that it was very  important to her health.  When I offered her the counseling service here,  she claimed that she did not need it and thought that she would try it on  her own.  However, I felt the need to consult them anyway because the  patient has a history of COPD and could have heart disease in the future. It was essential for her to her the facts and consider possibly quitting in  the future.   DISCHARGE LABS AND VITALS:  Temperature is 98.0, pulse is 74, respirations  20, blood pressure is 115/66.  The patient did not receive a CBC or B-met on  her final day of admission.  O2 sat's remained close to 100%.  There are no  pending labs at the time of discharge.  Thereasa Solo, M.D.  Electronically Signed     AS/MEDQ  D:  01/06/2006  T:  01/06/2006  Job:  [id number]   cc:   Thomas C. Wall, M.D.  [address]  [address]  [place]  [place] [address]

## 2010-11-06 NOTE — Discharge Summary (Signed)
NAME:  SOLSTICE, LASTINGER                         ACCOUNT NO.:  [id number]   MEDICAL RECORD NO.:  [id number]                   PATIENT TYPE:  INP   LOCATION:  0380                                 FACILITY:  [facility]   PHYSICIAN:  Deirdre Peer. Polite, M.D.              DATE OF BIRTH:  05/21/1930   DATE OF ADMISSION:  02/18/2004  DATE OF DISCHARGE:  02/21/2004                                 DISCHARGE SUMMARY   DISCHARGE DIAGNOSES:  1.  Bronchitis/sinusitis, improved.  2.  Chest pain secondary to #1.  Please note cardiac enzymes negative for      ischemia.  2 D echocardiogram with preserved ejection fraction and no      wall motion abnormality.  3.  Gastroesophageal reflux disease.  4.  Tobacco abuse, 1 pack-per-day.  5.  Hypertension.   DISCHARGE MEDICATIONS:  1.  The patient was to resume outpatient medications.  2.  __________.  3.  Procardia.  4.  Prevacid.  5.  Azithromycin x 3 more days 500 mg p.o. daily.  6.  Combivent MDI 2 puffs q.6h.   The patient is asked to stop smoking.  The patient is asked to follow up  with primary MD in approximately 1-2 weeks.   CONSULTANTS:  None.   STUDIES:  1.  CT:  No acute intracranial abnormality, probable left maxillary sinus      with acute sinusitis.  2.  Chest x-ray:  Cardiomegaly.  No evidence for active chest disease.  3.  CBC within normal limits.  4.  BMET on admission significant for mild hyponatremia at 126.  Follow up      BMET 129.  5.  Cardiac enzymes negative x 3.  Troponin I negative x 3.  6.  EKG without acute abnormalities.  7.  BNP less than 30.   HISTORY OF PRESENT ILLNESS:  A 77 year old female with known history of  above medical problems presented to the ED with complaints of productive  cough x 2 weeks and associated chest discomfort.  In the ED, the patient was  evaluated because of tobacco use, the complaint of chest pain, recommend  admission to rule out cardiac ischemia.  Please see dictated H&P for further  details.   PAST MEDICAL HISTORY:  As stated above.   ALLERGIES:  No known drug allergies.   MEDICATIONS ON ADMISSION:  1.  Diazide 25 mg daily.  2.  Procardia 30 mg daily.  3.  Prevacid 30 mg daily.   PERSONAL HISTORY:  Positive for tobacco, no alcohol, no drugs.   FAMILY HISTORY:  Noncontributory.   HOSPITAL COURSE:  #1 - BRONCHITIS AND CHEST PAIN:  The patient was admitted  to a medicine floor bed for evaluation and treatment of bronchitis and chest  pain.  For voracious bronchitis, the patient was given IV fluids and IV  antibiotics as well as nebulizer treatments.  The patient had rapid  improvement  in her symptoms of bronchitis.  The patient did not have any  expiratory wheezes.  Steroids were not indicated.   #2 - CHEST PAIN:  It was felt that the patient's complaints were more  consistent with musculoskeletal-type chest pain as a result of significant  coughing as a result of her bronchitis.  The patient did have cardiac  enzymes ordered which were negative.  She was continued on a monitor without  significant arrhythmia.  For completeness, the patient had a 2 D  echocardiogram which showed normal EF and no wall motion abnormality.  The  patient did not have any chest pain symptoms throughout this  hospitalization, and it was felt that her symptoms are primarily secondary  to #1 and with her echocardiogram showing no abnormalities, no further  cardiac evaluation was indicated at this time.   #3 - TOBACCO ABUSE:  Recommend abstinent from that.   #4 - MILD HYPONATREMIA:  Resolved.   At this time, the patient is medically stable for discharge.                                               Deirdre Peer. Polite, M.D.    RDP/MEDQ  D:  02/21/2004  T:  02/22/2004  Job:  [id number]

## 2010-11-06 NOTE — Consult Note (Signed)
NAMEDENISE, Kayla Brooks               ACCOUNT NO.:  [id number]   MEDICAL RECORD NO.:  [id number]          PATIENT TYPE:  INP   LOCATION:  2006                         FACILITY:  [facility]   PHYSICIAN:  Jesse Sans. Wall, M.D.   DATE OF BIRTH:  03/17/1930   DATE OF CONSULTATION:  01/05/2006  DATE OF DISCHARGE:                                   CONSULTATION   REFERRING PHYSICIAN:  Dr. Madaline Guthrie.   CHIEF COMPLAINT:  Chest discomfort.   HISTORY OF PRESENT ILLNESS:  Kayla Brooks is a 77 year old white female who  was admitted with chest pain and a cough on January 04, 2006.   Her symptoms are quite atypical for coronary ischemia.  They are well-  localized and are not related to exertion.  She says occasionally it does go  up into her shoulder.  It is usually fleeting or of very short duration.   However, she has multiple cardiac risk factors and her risk stratification  global score is high.  She is over the age of 77, has hypertension for 10  years, a heavy smoker, a pack per day since she was very young.  Her lipids  are remarkably good, however.  She is not diabetic.  She is not obese.   She has been noted to have some bradycardia in the hospital down to 47,  sinus brady.  She reports a history of some dizziness, which I doubt is  related to the bradycardia.   PAST MEDICAL HISTORY:  Significant for hypertension, gastroesophageal  reflux, COPD with tobacco use and an ongoing URI or chronic bronchitis.   ALLERGIES:  She has no known drug allergies.   PAST SURGICAL HISTORY:  She has had a hysterectomy with a unilateral  oophorectomy.   MEDICINES ON ADMISSION:  1.  Hydrochlorothiazide 25 mg a day.  2.  Famotidine 40 mg b.i.d.  3.  Ibuprofen p.r.n.   HABITS:  She does not use any illicit drugs; her drug screen was negative.  She does not use alcohol.   SOCIAL HISTORY:  She is widowed.  She has Medicare.  She lives alone, but  has good family support.   FAMILY HISTORY:   Noncontributory.   REVIEW OF SYSTEMS:  Negative, other than the HPI.   EXAM:  VITAL SIGNS:  Her blood pressure is 127/64, her pulse is 53 and sinus  brady, her temperature is 98.9, respiratory rate is 20, O2 SAT is 97% on  room air.  GENERAL:  She is extremely pleasant.  SKIN:  Warm and dry.  HEENT:  She is normocephalic, atraumatic.  PERLA.  Extraocular movements are  intact.  Sclerae are slightly muddy.  Facial symmetry is normal.  NECK:  Carotids are full.  There is a right carotid bruit.  There is no JVD.  Thyroid is not enlarged.  Trachea is midline.  LUNGS:  Reveal decreased  breath sounds throughout.  HEART:  Reveals a nondisplaced PMI.  There is a very faint systolic murmur  at left lower sternal border.  S2 splits.  There is no diastolic component.  ABDOMEN:  Soft  with good bowel sounds.  There is no midline bruits.  There  is no hepatomegaly.  There is no tenderness.  EXTREMITIES:  There is no cyanosis, clubbing or edema.  Pulses are present.  NEUROLOGIC:  Exam is grossly intact.   LABORATORY AND ACCESSORY CLINICAL DATA:  Her EKG shows sinus bradycardia  with an RSR prime in V1 and V2, which is old.  This was present on an old  EKG in 2005.   Her chest x-ray shows COPD with some mild cardiomegaly.   Laboratory data is significant for negative cardiac enzymes, negative drug  screen, normal thyroid profile and a relatively good lipid profile.   Two-dimensional echocardiogram shows mild aortic valve sclerosis, normal  left ventricular function and mild left ventricular hypertrophy.   ASSESSMENT:  1.  Noncardiac chest pain, yet multiple cardiac risk factors.  Her      stratification is in the high range.  She also has a carotid bruit on my      exam.  2.  Hypertension.  3.  Gastroesophageal reflux.  4.  Chronic obstructive pulmonary disease with ongoing tobacco use.  5.  Tobacco use.  6.  Mild aortic valve sclerosis.  7.  Mild left ventricular hypertrophy secondary to  #2.  8.  Relatively good lipid panel.  9.  Bradycardia, which I think is asymptomatic.   RECOMMENDATIONS:  1.  Exercise rest/stress Cardiolite to evaluate for chronotropic competence      and to rule out any obstructive coronary disease to risk stratify.  2.  Discontinue smoking.  3.  Carotid Dopplers.   Thanks for the consultation.      Thomas C. Wall, M.D.  Electronically Signed     TCW/MEDQ  D:  01/05/2006  T:  01/06/2006  Job:  [id number]   cc:   Madaline Guthrie, M.D.

## 2010-11-27 ENCOUNTER — Other Ambulatory Visit (INDEPENDENT_AMBULATORY_CARE_PROVIDER_SITE_OTHER): Payer: Medicare Other | Admitting: *Deleted

## 2010-11-27 DIAGNOSIS — K219 Gastro-esophageal reflux disease without esophagitis: Secondary | ICD-10-CM

## 2010-11-29 MED ORDER — OMEPRAZOLE 20 MG PO CPDR: 20.0000 mg | DELAYED_RELEASE_CAPSULE | Freq: Every day | ORAL | Status: DC

## 2010-12-10 ENCOUNTER — Other Ambulatory Visit: Payer: Self-pay | Admitting: Oncology

## 2010-12-10 ENCOUNTER — Encounter (HOSPITAL_BASED_OUTPATIENT_CLINIC_OR_DEPARTMENT_OTHER): Payer: Medicare Other | Admitting: Oncology

## 2010-12-10 DIAGNOSIS — C9030 Solitary plasmacytoma not having achieved remission: Secondary | ICD-10-CM

## 2010-12-10 DIAGNOSIS — E876 Hypokalemia: Secondary | ICD-10-CM | POA: Insufficient documentation

## 2010-12-10 DIAGNOSIS — C9000 Multiple myeloma not having achieved remission: Secondary | ICD-10-CM

## 2010-12-10 LAB — COMPREHENSIVE METABOLIC PANEL
Albumin: 3.5 g/dL (ref 3.5–5.2)
BUN: 12 mg/dL (ref 6–23)
CO2: 32 mEq/L (ref 19–32)
Calcium: 8.9 mg/dL (ref 8.4–10.5)
Chloride: 105 mEq/L (ref 96–112)
Glucose, Bld: 87 mg/dL (ref 70–99)
Potassium: 2.7 mEq/L — CL (ref 3.5–5.3)
Sodium: 145 mEq/L (ref 135–145)
Total Protein: 5.9 g/dL — ABNORMAL LOW (ref 6.0–8.3)

## 2010-12-10 LAB — CBC WITH DIFFERENTIAL/PLATELET
Basophils Absolute: 0 10*3/uL (ref 0.0–0.1)
Eosinophils Absolute: 0.1 10*3/uL (ref 0.0–0.5)
HGB: 10.7 g/dL — ABNORMAL LOW (ref 11.6–15.9)
LYMPH%: 35.8 % (ref 14.0–49.7)
MCV: 95.3 fL (ref 79.5–101.0)
MONO%: 9.3 % (ref 0.0–14.0)
NEUT#: 1.5 10*3/uL (ref 1.5–6.5)
Platelets: 130 10*3/uL — ABNORMAL LOW (ref 145–400)
RBC: 3.32 10*6/uL — ABNORMAL LOW (ref 3.70–5.45)

## 2010-12-14 ENCOUNTER — Other Ambulatory Visit (INDEPENDENT_AMBULATORY_CARE_PROVIDER_SITE_OTHER): Payer: Medicare Other | Admitting: Internal Medicine

## 2010-12-14 DIAGNOSIS — F329 Major depressive disorder, single episode, unspecified: Secondary | ICD-10-CM

## 2010-12-15 ENCOUNTER — Ambulatory Visit (INDEPENDENT_AMBULATORY_CARE_PROVIDER_SITE_OTHER): Payer: Medicare Other | Admitting: Internal Medicine

## 2010-12-15 ENCOUNTER — Encounter: Payer: Self-pay | Admitting: Internal Medicine

## 2010-12-15 DIAGNOSIS — E785 Hyperlipidemia, unspecified: Secondary | ICD-10-CM

## 2010-12-15 DIAGNOSIS — I10 Essential (primary) hypertension: Secondary | ICD-10-CM

## 2010-12-15 DIAGNOSIS — F329 Major depressive disorder, single episode, unspecified: Secondary | ICD-10-CM

## 2010-12-15 DIAGNOSIS — I498 Other specified cardiac arrhythmias: Secondary | ICD-10-CM

## 2010-12-15 DIAGNOSIS — E876 Hypokalemia: Secondary | ICD-10-CM

## 2010-12-15 DIAGNOSIS — C9000 Multiple myeloma not having achieved remission: Secondary | ICD-10-CM

## 2010-12-15 MED ORDER — SIMVASTATIN 20 MG PO TABS: 20.0000 mg | ORAL_TABLET | Freq: Every day | ORAL | Status: DC

## 2010-12-15 MED ORDER — AMLODIPINE BESYLATE 5 MG PO TABS: 5.0000 mg | ORAL_TABLET | Freq: Every day | ORAL | Status: DC

## 2010-12-15 MED ORDER — ALPRAZOLAM 0.25 MG PO TABS: ORAL_TABLET | ORAL | Status: DC

## 2010-12-15 MED ORDER — SERTRALINE HCL 25 MG PO TABS: 25.0000 mg | ORAL_TABLET | Freq: Every day | ORAL | Status: DC

## 2010-12-15 NOTE — Telephone Encounter (Signed)
Alprazolam rx called to [facility] pharmacy.

## 2010-12-16 ENCOUNTER — Encounter: Payer: Self-pay | Admitting: Internal Medicine

## 2010-12-16 NOTE — Assessment & Plan Note (Signed)
Patient has known bradycardia. Patient is asymptomatic. We'll continue to monitor.

## 2010-12-16 NOTE — Assessment & Plan Note (Signed)
Followed by Dr Judie Bonus at the cancer center.

## 2010-12-16 NOTE — Assessment & Plan Note (Signed)
Patient has been found to have low potassium with 2.7 on June 21 at the office visit with Dr.Shad at the cancer Center. Patient was started on potassium chloride 20 mEq daily. It is at this point unclear why patient has low potassium. She's not taking any diuretics. Patient is scheduled to get a basic metabolic panel on June 28 at the office. Therefore I did not repeat her basic metabolic panel today. Patient may need higher dose of potassium. Will continue to monitor

## 2010-12-16 NOTE — Progress Notes (Signed)
  Subjective:    Patient ID: Kayla Brooks, female    DOB: Sep 20, 1929, 77 y.o.   MRN: [id number]  HPI This is a 77 year old female with past medical history significant for multiple myeloma, depression, hypertension, history of MI, COPD who presented to the clinic for a regular office visit. She noted that she is doing fine and is not experiencing any chest pain, shortness of breath,  abdominal pain, diarrhea or constipation, nausea or vomiting, lower extremity edema, dizziness, weakness or numbness. She reports that she has been seen by oncology on June 21 and was found to have a low potassium and was started on a potassium pill. She will followup with oncology on Thursday, June 28.    Review of Systems  Constitutional: Negative for fever, chills, activity change, appetite change, fatigue and unexpected weight change.  HENT: Negative for hearing loss and neck pain.   Eyes: Negative for visual disturbance.  Respiratory: Negative for chest tightness, shortness of breath and wheezing.   Cardiovascular: Negative for chest pain, palpitations and leg swelling.  Gastrointestinal: Negative for nausea, vomiting, abdominal pain, diarrhea, constipation and abdominal distention.  Genitourinary: Negative for urgency, hematuria and difficulty urinating.  Musculoskeletal: Negative for back pain and gait problem.  Neurological: Negative for dizziness, weakness, light-headedness, numbness and headaches.       Objective:   Physical Exam  Constitutional: She is oriented to person, place, and time. She appears well-developed and well-nourished.  HENT:  Head: Normocephalic.  Neck: Neck supple.  Cardiovascular: Normal rate and regular rhythm.   Pulmonary/Chest: Effort normal and breath sounds normal.  Abdominal: Soft. Bowel sounds are normal.  Musculoskeletal: Normal range of motion. She exhibits edema.  Neurological: She is alert and oriented to person, place, and time.  Skin: Skin is warm and dry.    Psychiatric: She has a normal mood and affect.          Assessment & Plan:

## 2010-12-16 NOTE — Assessment & Plan Note (Addendum)
Patient's blood pressure slightly elevated today. The patient did not take her Norvasc since 2 days. I'll continue to monitor. Blood pressure persistent elevated patient may need change in management.  BP Readings from Last 3 Encounters:  12/16/10 140/85  06/16/10 135/69  05/01/10 139/74

## 2010-12-17 ENCOUNTER — Encounter (HOSPITAL_BASED_OUTPATIENT_CLINIC_OR_DEPARTMENT_OTHER): Payer: Medicare Other | Admitting: Oncology

## 2010-12-17 ENCOUNTER — Other Ambulatory Visit: Payer: Self-pay | Admitting: Oncology

## 2010-12-17 DIAGNOSIS — C7951 Secondary malignant neoplasm of bone: Secondary | ICD-10-CM

## 2010-12-17 DIAGNOSIS — C9000 Multiple myeloma not having achieved remission: Secondary | ICD-10-CM

## 2010-12-17 DIAGNOSIS — C9030 Solitary plasmacytoma not having achieved remission: Secondary | ICD-10-CM

## 2010-12-17 LAB — BASIC METABOLIC PANEL
Chloride: 104 mEq/L (ref 96–112)
Potassium: 4.3 mEq/L (ref 3.5–5.3)
Sodium: 138 mEq/L (ref 135–145)

## 2010-12-22 ENCOUNTER — Telehealth: Payer: Self-pay | Admitting: Internal Medicine

## 2010-12-22 NOTE — Telephone Encounter (Signed)
Reviewed bmet.Potassium within normal limits.

## 2010-12-31 ENCOUNTER — Encounter (HOSPITAL_BASED_OUTPATIENT_CLINIC_OR_DEPARTMENT_OTHER): Payer: Medicare Other | Admitting: Oncology

## 2010-12-31 ENCOUNTER — Other Ambulatory Visit: Payer: Self-pay | Admitting: Oncology

## 2010-12-31 DIAGNOSIS — C9000 Multiple myeloma not having achieved remission: Secondary | ICD-10-CM

## 2010-12-31 DIAGNOSIS — C7952 Secondary malignant neoplasm of bone marrow: Secondary | ICD-10-CM

## 2010-12-31 DIAGNOSIS — C7951 Secondary malignant neoplasm of bone: Secondary | ICD-10-CM

## 2010-12-31 DIAGNOSIS — C9030 Solitary plasmacytoma not having achieved remission: Secondary | ICD-10-CM

## 2010-12-31 LAB — BASIC METABOLIC PANEL
BUN: 18 mg/dL (ref 6–23)
CO2: 26 mEq/L (ref 19–32)
Chloride: 102 mEq/L (ref 96–112)
Creatinine, Ser: 1.01 mg/dL (ref 0.50–1.10)
Glucose, Bld: 91 mg/dL (ref 70–99)

## 2011-01-26 ENCOUNTER — Encounter: Payer: Medicare Other | Admitting: Internal Medicine

## 2011-01-28 ENCOUNTER — Other Ambulatory Visit: Payer: Self-pay

## 2011-01-28 ENCOUNTER — Ambulatory Visit (INDEPENDENT_AMBULATORY_CARE_PROVIDER_SITE_OTHER): Payer: Medicare Other | Admitting: Internal Medicine

## 2011-01-28 ENCOUNTER — Encounter: Payer: Self-pay | Admitting: Internal Medicine

## 2011-01-28 ENCOUNTER — Ambulatory Visit (HOSPITAL_COMMUNITY)
Admission: RE | Admit: 2011-01-28 | Discharge: 2011-01-28 | Disposition: A | Discharge status: Home / Self Care | Payer: Medicare Other | Source: Ambulatory Visit | Attending: Internal Medicine | Admitting: Internal Medicine

## 2011-01-28 VITALS — BP 135/64 | HR 43 | Temp 98.3°F | Ht 65.0 in | Wt 127.7 lb

## 2011-01-28 DIAGNOSIS — F329 Major depressive disorder, single episode, unspecified: Secondary | ICD-10-CM

## 2011-01-28 DIAGNOSIS — I498 Other specified cardiac arrhythmias: Secondary | ICD-10-CM | POA: Insufficient documentation

## 2011-01-28 DIAGNOSIS — I4460 Unspecified fascicular block: Secondary | ICD-10-CM | POA: Insufficient documentation

## 2011-01-28 DIAGNOSIS — R001 Bradycardia, unspecified: Secondary | ICD-10-CM

## 2011-01-28 DIAGNOSIS — I10 Essential (primary) hypertension: Secondary | ICD-10-CM

## 2011-01-28 DIAGNOSIS — K219 Gastro-esophageal reflux disease without esophagitis: Secondary | ICD-10-CM

## 2011-01-28 DIAGNOSIS — E785 Hyperlipidemia, unspecified: Secondary | ICD-10-CM

## 2011-01-28 MED ORDER — ALENDRONATE SODIUM 35 MG PO TABS: 35.0000 mg | ORAL_TABLET | ORAL | Status: DC

## 2011-01-28 MED ORDER — ASPIRIN 325 MG PO TABS: 325.0000 mg | ORAL_TABLET | Freq: Two times a day (BID) | ORAL | Status: DC

## 2011-01-28 MED ORDER — OMEPRAZOLE 20 MG PO CPDR: 20.0000 mg | DELAYED_RELEASE_CAPSULE | Freq: Two times a day (BID) | ORAL | Status: DC

## 2011-01-28 MED ORDER — ALPRAZOLAM 0.25 MG PO TABS: ORAL_TABLET | ORAL | Status: DC

## 2011-01-28 MED ORDER — LISINOPRIL 40 MG PO TABS: 40.0000 mg | ORAL_TABLET | Freq: Every day | ORAL | Status: DC

## 2011-01-28 MED ORDER — SERTRALINE HCL 25 MG PO TABS: 25.0000 mg | ORAL_TABLET | Freq: Every day | ORAL | Status: DC

## 2011-01-28 MED ORDER — NITROGLYCERIN 0.4 MG SL SUBL: 0.4000 mg | SUBLINGUAL_TABLET | SUBLINGUAL | Status: DC | PRN

## 2011-01-28 MED ORDER — POTASSIUM CHLORIDE CRYS ER 20 MEQ PO TBCR: 20.0000 meq | EXTENDED_RELEASE_TABLET | Freq: Every day | ORAL | Status: DC

## 2011-01-28 MED ORDER — AMLODIPINE BESYLATE 5 MG PO TABS: 5.0000 mg | ORAL_TABLET | Freq: Every day | ORAL | Status: DC

## 2011-01-28 MED ORDER — HYDROCODONE-ACETAMINOPHEN 5-500 MG PO TABS: 1.0000 | ORAL_TABLET | ORAL | Status: DC | PRN

## 2011-01-28 MED ORDER — SIMVASTATIN 20 MG PO TABS: 20.0000 mg | ORAL_TABLET | Freq: Every day | ORAL | Status: DC

## 2011-01-28 NOTE — Assessment & Plan Note (Signed)
Will provide refill on omeprazole. Acid reflux appears to be stable on current medication dose.

## 2011-01-28 NOTE — Assessment & Plan Note (Signed)
Sinus bradycardia arrhythmia on 12-lead EKG noted on today's visit. EKG was reviewed by attending Dr. Josem Kaufmann. Patient is entirely asymptomatic with heart rate in the 40s. She has followup appointment with cardiologist in the next week. We have no other recommendations at this time. Patient was advised if she develops symptoms of chest pain, shortness of breath, weakness, dizziness she needs to call clinic or go to the ED for further evaluation.

## 2011-01-28 NOTE — Assessment & Plan Note (Signed)
Last cholesterol panel checked one year ago. Patient prefers to have her lipid panel checked on next visit since she was not fasting today. We'll continue the same medication regimen. Liver function tests were checked 2 months ago and were within normal limits. Patient denies known side effects to the current medication.

## 2011-01-28 NOTE — Progress Notes (Signed)
  Subjective:    Patient ID: Kayla Brooks, female    DOB: [birth year], 77 y.o.   MRN: [id number]  HPI  Patient is 77 year old female with past medical history outlined below who presents to clinic for regular followup on her blood pressure. She reports compliance with her medications and denies any recent concerns. She denies recent sicknesses or hospitalizations, no episodes of chest pain or shortness of breath, no abdominal urine or concerns, no systemic symptoms fevers chills and weight loss, no weakness, dizziness or visual changes. She reports staying active and hydrating herself well daily. She would like to get a refill on her medications today.  Review of Systems Constitutional: Denies fever, chills, diaphoresis, appetite change and fatigue.  HEENT: Denies photophobia, eye pain, redness, hearing loss, ear pain, congestion, sore throat, rhinorrhea, sneezing, mouth sores, trouble swallowing, neck pain, neck stiffness and tinnitus.   Respiratory: Denies SOB, DOE, cough, chest tightness,  and wheezing.   Cardiovascular: Denies chest pain, palpitations and leg swelling.  Gastrointestinal: Denies nausea, vomiting, abdominal pain, diarrhea, constipation, blood in stool and abdominal distention.  Genitourinary: Denies dysuria, urgency, frequency, hematuria, flank pain and difficulty urinating.  Musculoskeletal: Denies myalgias, back pain, joint swelling, arthralgias and gait problem.  Skin: Denies pallor, rash and wound.  Neurological: Denies dizziness, seizures, syncope, weakness, light-headedness, numbness and headaches.  Hematological: Denies adenopathy. Easy bruising, personal or family bleeding history  Psychiatric/Behavioral: Denies suicidal ideation, mood changes, confusion, nervousness, sleep disturbance and agitation      Objective:   Physical Exam  Constitutional: Vital signs reviewed.  Patient is a well-developed and well-nourished in no acute distress and cooperative with exam.  Alert and oriented x3.  Neck: Supple, Trachea midline normal ROM, No JVD, mass, thyromegaly, or carotid bruit present.  Cardiovascular: Regular rhythm, bradycardia, S1 normal, S2 normal, no MRG, pulses symmetric and intact bilaterally Pulmonary/Chest: CTAB, no wheezes, rales, or rhonchi Abdominal: Soft. Non-tender, non-distended, bowel sounds are normal, no masses, organomegaly, or guarding present.          Assessment & Plan:

## 2011-01-28 NOTE — Assessment & Plan Note (Signed)
Blood pressure is well controlled on current medication regimen. We'll make no changes today to her regimen. I have advised patient to check her blood pressure regularly at home and if the numbers are 140/90 she is to call clinic back for further evaluation.

## 2011-02-03 ENCOUNTER — Encounter (HOSPITAL_BASED_OUTPATIENT_CLINIC_OR_DEPARTMENT_OTHER): Payer: Medicare Other | Admitting: Oncology

## 2011-02-03 ENCOUNTER — Other Ambulatory Visit: Payer: Self-pay | Admitting: Oncology

## 2011-02-03 DIAGNOSIS — C9000 Multiple myeloma not having achieved remission: Secondary | ICD-10-CM

## 2011-02-03 DIAGNOSIS — C9030 Solitary plasmacytoma not having achieved remission: Secondary | ICD-10-CM

## 2011-02-03 DIAGNOSIS — C888 Other malignant immunoproliferative diseases: Secondary | ICD-10-CM

## 2011-02-03 LAB — CBC WITH DIFFERENTIAL/PLATELET
BASO%: 0.4 % (ref 0.0–2.0)
Basophils Absolute: 0 10*3/uL (ref 0.0–0.1)
EOS%: 2.8 % (ref 0.0–7.0)
Eosinophils Absolute: 0.1 10*3/uL (ref 0.0–0.5)
HCT: 31.4 % — ABNORMAL LOW (ref 34.8–46.6)
HGB: 10.8 g/dL — ABNORMAL LOW (ref 11.6–15.9)
LYMPH%: 33.9 % (ref 14.0–49.7)
MCH: 32.7 pg (ref 25.1–34.0)
MCHC: 34.3 g/dL (ref 31.5–36.0)
MCV: 95.5 fL (ref 79.5–101.0)
MONO#: 0.3 10*3/uL (ref 0.1–0.9)
MONO%: 9.1 % (ref 0.0–14.0)
NEUT#: 1.7 10*3/uL (ref 1.5–6.5)
NEUT%: 53.8 % (ref 38.4–76.8)
Platelets: 118 10*3/uL — ABNORMAL LOW (ref 145–400)
RBC: 3.29 10*6/uL — ABNORMAL LOW (ref 3.70–5.45)
RDW: 14.8 % — ABNORMAL HIGH (ref 11.2–14.5)
WBC: 3.1 10*3/uL — ABNORMAL LOW (ref 3.9–10.3)
lymph#: 1.1 10*3/uL (ref 0.9–3.3)

## 2011-02-03 LAB — COMPREHENSIVE METABOLIC PANEL
ALT: 16 U/L (ref 0–35)
CO2: 29 mEq/L (ref 19–32)
Calcium: 9.7 mg/dL (ref 8.4–10.5)
Chloride: 106 mEq/L (ref 96–112)
Creatinine, Ser: 0.81 mg/dL (ref 0.50–1.10)
Sodium: 142 mEq/L (ref 135–145)
Total Protein: 6.2 g/dL (ref 6.0–8.3)

## 2011-02-05 LAB — SPEP & IFE WITH QIG
Alpha-2-Globulin: 11.7 % (ref 7.1–11.8)
Beta 2: 5.3 % (ref 3.2–6.5)
Beta Globulin: 6.3 % (ref 4.7–7.2)
Gamma Globulin: 13.5 % (ref 11.1–18.8)
IgA: 184 mg/dL (ref 68–380)
IgG (Immunoglobin G), Serum: 790 mg/dL (ref 690–1700)

## 2011-02-05 LAB — KAPPA/LAMBDA LIGHT CHAINS: Kappa free light chain: 24.3 mg/dL — ABNORMAL HIGH (ref 0.33–1.94)

## 2011-02-10 ENCOUNTER — Ambulatory Visit (HOSPITAL_COMMUNITY)
Admission: RE | Admit: 2011-02-10 | Discharge: 2011-02-10 | Disposition: A | Discharge status: Home / Self Care | Payer: Medicare Other | Source: Ambulatory Visit | Attending: Oncology | Admitting: Oncology

## 2011-02-10 DIAGNOSIS — C9000 Multiple myeloma not having achieved remission: Secondary | ICD-10-CM | POA: Insufficient documentation

## 2011-02-10 DIAGNOSIS — K8020 Calculus of gallbladder without cholecystitis without obstruction: Secondary | ICD-10-CM | POA: Insufficient documentation

## 2011-02-10 DIAGNOSIS — M899 Disorder of bone, unspecified: Secondary | ICD-10-CM | POA: Insufficient documentation

## 2011-02-10 DIAGNOSIS — M47814 Spondylosis without myelopathy or radiculopathy, thoracic region: Secondary | ICD-10-CM | POA: Insufficient documentation

## 2011-02-10 DIAGNOSIS — M47817 Spondylosis without myelopathy or radiculopathy, lumbosacral region: Secondary | ICD-10-CM | POA: Insufficient documentation

## 2011-02-10 DIAGNOSIS — M4120 Other idiopathic scoliosis, site unspecified: Secondary | ICD-10-CM | POA: Insufficient documentation

## 2011-02-10 DIAGNOSIS — I700 Atherosclerosis of aorta: Secondary | ICD-10-CM | POA: Insufficient documentation

## 2011-03-18 LAB — CBC
HCT: 26.4 — ABNORMAL LOW
HCT: 35.1 — ABNORMAL LOW
HCT: 36.8
HCT: 37.2
HCT: 37.9
Hemoglobin: 10.5 — ABNORMAL LOW
Hemoglobin: 11.9 — ABNORMAL LOW
Hemoglobin: 12
Hemoglobin: 12.7
MCHC: 34
MCHC: 34.2
MCHC: 34.5
MCHC: 34.8
MCHC: 34.8
MCHC: 33.5
MCV: 90.8
MCV: 90.9
MCV: 91.3
MCV: 91.9
MCV: 92.1
MCV: 92.1
Platelets: 188
Platelets: 198
Platelets: 212
Platelets: 254
Platelets: 256
Platelets: 285
Platelets: 247
RBC: 3.26 — ABNORMAL LOW
RBC: 3.81 — ABNORMAL LOW
RBC: 3.86 — ABNORMAL LOW
RBC: 4.03
RBC: 4.12
RDW: 12.7
RDW: 12.8
RDW: 12.9
RDW: 13
RDW: 13.1
RDW: 13.7
WBC: 10.2
WBC: 12.1 — ABNORMAL HIGH
WBC: 7.9
WBC: 8.4
WBC: 8.6
WBC: 9.3
WBC: 7.4

## 2011-03-18 LAB — COMPREHENSIVE METABOLIC PANEL
ALT: 13
ALT: 14
AST: 39 — ABNORMAL HIGH
Albumin: 3 — ABNORMAL LOW
Albumin: 3.6
Alkaline Phosphatase: 68
Alkaline Phosphatase: 72
BUN: 11
CO2: 25
Calcium: 8.9
Calcium: 9.7
Chloride: 101
Creatinine, Ser: 0.81
GFR calc Af Amer: >60
GFR calc Af Amer: >60
GFR calc non Af Amer: 57 — ABNORMAL LOW
Glucose, Bld: 178 — ABNORMAL HIGH
Glucose, Bld: 96
Potassium: 4.3
Sodium: 129 — ABNORMAL LOW
Sodium: 134 — ABNORMAL LOW
Total Bilirubin: 1.1
Total Protein: 6.7

## 2011-03-18 LAB — POCT CARDIAC MARKERS
Myoglobin, poc: 342
Myoglobin, poc: 96.1
Operator id: 234501
Operator id: 234501
Operator id: 294521
Troponin i, poc: <0.05

## 2011-03-18 LAB — BASIC METABOLIC PANEL
BUN: 10
BUN: 12
BUN: 12
BUN: 12
CO2: 22
CO2: 25
CO2: 25
Calcium: 8.5
Calcium: 9
Chloride: 103
Chloride: 105
Chloride: 106
Chloride: 107
Creatinine, Ser: 0.76
Creatinine, Ser: 0.8
GFR calc Af Amer: >60
GFR calc Af Amer: >60
GFR calc non Af Amer: 55 — ABNORMAL LOW
Glucose, Bld: 94
Potassium: 3.7
Potassium: 3.9
Potassium: 4.1
Sodium: 138
Sodium: 140

## 2011-03-18 LAB — LUPUS ANTICOAGULANT PANEL
DRVVT: 40.2 (ref 36.1–47.0)
PTT Lupus Anticoagulant: 47.5 (ref 36.3–48.8)

## 2011-03-18 LAB — POCT I-STAT, CHEM 8
BUN: 16
Calcium, Ion: 1.16
Creatinine, Ser: 1.2
Glucose, Bld: 107 — ABNORMAL HIGH
Hemoglobin: 12.9
TCO2: 25

## 2011-03-18 LAB — DIFFERENTIAL
Basophils Absolute: 0
Basophils Absolute: 0
Basophils Relative: 0
Basophils Relative: 1
Eosinophils Absolute: 0.1
Eosinophils Absolute: 0.1
Eosinophils Relative: 2
Eosinophils Relative: 1
Lymphocytes Relative: 28
Lymphs Abs: 1.8
Monocytes Relative: 5
Neutro Abs: 4.2
Neutrophils Relative %: 75
Neutrophils Relative %: 70

## 2011-03-18 LAB — CARDIAC PANEL(CRET KIN+CKTOT+MB+TROPI)
CK, MB: 1.3
CK, MB: 1.4
Relative Index: 7 — ABNORMAL HIGH
Relative Index: INVALID
Total CK: 372 — ABNORMAL HIGH
Total CK: 425 — ABNORMAL HIGH
Troponin I: 4.01
Troponin I: 5.66
Troponin I: <0.01

## 2011-03-18 LAB — PROTHROMBIN GENE MUTATION

## 2011-03-18 LAB — IRON AND TIBC
TIBC: 251
UIBC: 162

## 2011-03-18 LAB — HEPATIC FUNCTION PANEL
ALT: 14
Albumin: 3.2 — ABNORMAL LOW
Alkaline Phosphatase: 68
Indirect Bilirubin: 0.6
Total Protein: 6

## 2011-03-18 LAB — LIPID PANEL
Cholesterol: 131
HDL: 57
HDL: 58
LDL Cholesterol: 65
Total CHOL/HDL Ratio: 2.3
Triglycerides: 39
VLDL: 7

## 2011-03-18 LAB — OCCULT BLOOD (STOOL CUP TO LAB): Fecal Occult Bld: POSITIVE

## 2011-03-18 LAB — URINALYSIS, ROUTINE W REFLEX MICROSCOPIC
Glucose, UA: NEGATIVE
Hgb urine dipstick: NEGATIVE
Specific Gravity, Urine: 1.01
pH: 6.5

## 2011-03-18 LAB — SODIUM, URINE, RANDOM: Sodium, Ur: 44

## 2011-03-18 LAB — LACTATE DEHYDROGENASE, ISOENZYMES
LDH 2: 32 % (ref 29–42)
LDH 3: 19 % (ref 16–22)
LDH 4: 7 % — ABNORMAL LOW (ref 8–15)
LDH 5: 6 % (ref 6–23)

## 2011-03-18 LAB — RETICULOCYTES: Retic Count, Absolute: 26.7

## 2011-03-18 LAB — CROSSMATCH
ABO/RH(D): O POS
Antibody Screen: NEGATIVE

## 2011-03-18 LAB — CK TOTAL AND CKMB (NOT AT ARMC): CK, MB: 1.3

## 2011-03-18 LAB — FOLATE: Folate: 10.7

## 2011-03-18 LAB — HEPARIN LEVEL (UNFRACTIONATED)
Heparin Unfractionated: 0.34
Heparin Unfractionated: 0.46
Heparin Unfractionated: 0.94 — ABNORMAL HIGH

## 2011-03-18 LAB — FERRITIN: Ferritin: 127 (ref 10–291)

## 2011-03-18 LAB — ABO/RH: ABO/RH(D): O POS

## 2011-03-18 LAB — MAGNESIUM
Magnesium: 1.7
Magnesium: 1.7
Magnesium: 2

## 2011-03-18 LAB — HAPTOGLOBIN: Haptoglobin: 151

## 2011-03-18 LAB — APTT: aPTT: 25

## 2011-03-18 LAB — TROPONIN I: Troponin I: 4.33

## 2011-03-18 LAB — URINE MICROSCOPIC-ADD ON

## 2011-03-18 LAB — BETA-2-GLYCOPROTEIN I ABS, IGG/M/A: Beta-2-Glycoprotein I IgA: 4 U/mL (ref <10)

## 2011-03-18 LAB — PROTIME-INR: Prothrombin Time: 12.8

## 2011-03-18 LAB — CARDIOLIPIN ANTIBODIES, IGG, IGM, IGA: Anticardiolipin IgA: <7 — ABNORMAL LOW (ref <13)

## 2011-03-25 ENCOUNTER — Other Ambulatory Visit: Payer: Self-pay | Admitting: Oncology

## 2011-03-25 ENCOUNTER — Encounter (HOSPITAL_BASED_OUTPATIENT_CLINIC_OR_DEPARTMENT_OTHER): Payer: Medicare Other | Admitting: Oncology

## 2011-03-25 DIAGNOSIS — C9030 Solitary plasmacytoma not having achieved remission: Secondary | ICD-10-CM

## 2011-03-25 DIAGNOSIS — C9000 Multiple myeloma not having achieved remission: Secondary | ICD-10-CM

## 2011-03-25 LAB — CBC WITH DIFFERENTIAL/PLATELET
Eosinophils Absolute: 0.2 10*3/uL (ref 0.0–0.5)
MONO#: 0.3 10*3/uL (ref 0.1–0.9)
NEUT#: 1.7 10*3/uL (ref 1.5–6.5)
RBC: 3.29 10*6/uL — ABNORMAL LOW (ref 3.70–5.45)
RDW: 15.6 % — ABNORMAL HIGH (ref 11.2–14.5)
WBC: 3.2 10*3/uL — ABNORMAL LOW (ref 3.9–10.3)
lymph#: 0.9 10*3/uL (ref 0.9–3.3)

## 2011-03-25 LAB — COMPREHENSIVE METABOLIC PANEL
ALT: 10 U/L (ref 0–35)
Albumin: 3.7 g/dL (ref 3.5–5.2)
CO2: 29 mEq/L (ref 19–32)
Glucose, Bld: 86 mg/dL (ref 70–99)
Potassium: 3 mEq/L — ABNORMAL LOW (ref 3.5–5.3)
Sodium: 143 mEq/L (ref 135–145)
Total Protein: 6.1 g/dL (ref 6.0–8.3)

## 2011-05-05 ENCOUNTER — Encounter: Payer: Self-pay | Admitting: *Deleted

## 2011-05-05 NOTE — Progress Notes (Signed)
[facility] faxed Revlimid refill request.  Request to MD for review.  

## 2011-05-06 ENCOUNTER — Other Ambulatory Visit: Payer: Self-pay | Admitting: *Deleted

## 2011-05-06 ENCOUNTER — Encounter: Payer: Self-pay | Admitting: *Deleted

## 2011-05-06 DIAGNOSIS — C9000 Multiple myeloma not having achieved remission: Secondary | ICD-10-CM

## 2011-05-06 MED ORDER — LENALIDOMIDE 15 MG PO CAPS: 15.0000 mg | ORAL_CAPSULE | Freq: Every day | ORAL | Status: DC

## 2011-05-06 NOTE — Progress Notes (Signed)
RECEIVED A FAX FROM [facility] CONCERNING A CONFIRMATION OF FACSIMILE RECEIPT. 

## 2011-05-06 NOTE — Telephone Encounter (Signed)
PT.'S SURVEY COMPLETED ON 01/29/11. PHYSICIAN'S SURVEY COMPLETED ON 05/06/11. PT. IS FEMALE NOT OF CHILD BEARING POTENTIAL. AUTHORIZATION #[id number].

## 2011-05-11 ENCOUNTER — Encounter: Payer: Self-pay | Admitting: *Deleted

## 2011-05-11 NOTE — Progress Notes (Signed)
RECEIVED A FAX FROM [facility] CONCERNING A CONFIRMATION OF PRESCRIPTION SHIPMENT. 

## 2011-05-19 ENCOUNTER — Encounter: Payer: Self-pay | Admitting: *Deleted

## 2011-05-27 ENCOUNTER — Ambulatory Visit: Payer: Medicare Other | Admitting: Oncology

## 2011-05-27 ENCOUNTER — Other Ambulatory Visit: Payer: Medicare Other

## 2011-06-02 ENCOUNTER — Encounter: Payer: Self-pay | Admitting: *Deleted

## 2011-06-02 NOTE — Progress Notes (Signed)
Refill request for Revlimid received from [facility].  Request to MD for review.

## 2011-06-03 ENCOUNTER — Other Ambulatory Visit: Payer: Self-pay | Admitting: *Deleted

## 2011-06-03 DIAGNOSIS — C9000 Multiple myeloma not having achieved remission: Secondary | ICD-10-CM

## 2011-06-03 MED ORDER — LENALIDOMIDE 15 MG PO CAPS: 15.0000 mg | ORAL_CAPSULE | Freq: Every day | ORAL | Status: DC

## 2011-06-11 ENCOUNTER — Encounter: Payer: Self-pay | Admitting: *Deleted

## 2011-06-11 NOTE — Progress Notes (Signed)
RECEIVED A FAX FROM [facility] CONCERNING A CONFIRMATION OF PRESCRIPTION SHIPMENT FOR REVLIMID. 

## 2011-07-02 ENCOUNTER — Encounter: Payer: Self-pay | Admitting: *Deleted

## 2011-07-02 NOTE — Progress Notes (Signed)
[facility] faxed revlimid refill request.  Request to MD for review. 

## 2011-07-05 ENCOUNTER — Encounter: Payer: Self-pay | Admitting: *Deleted

## 2011-07-05 ENCOUNTER — Other Ambulatory Visit: Payer: Self-pay | Admitting: *Deleted

## 2011-07-05 DIAGNOSIS — C9000 Multiple myeloma not having achieved remission: Secondary | ICD-10-CM

## 2011-07-05 MED ORDER — LENALIDOMIDE 15 MG PO CAPS: 15.0000 mg | ORAL_CAPSULE | Freq: Every day | ORAL | Status: DC

## 2011-07-05 NOTE — Progress Notes (Signed)
RECEIVED A FAX FROM [facility] CONCERNING A CONFIRMATION OF FACSIMILE RECEIPT. 

## 2011-07-06 ENCOUNTER — Ambulatory Visit (HOSPITAL_BASED_OUTPATIENT_CLINIC_OR_DEPARTMENT_OTHER): Payer: Medicare Other | Admitting: Oncology

## 2011-07-06 ENCOUNTER — Other Ambulatory Visit: Payer: Self-pay | Admitting: Oncology

## 2011-07-06 ENCOUNTER — Other Ambulatory Visit (HOSPITAL_BASED_OUTPATIENT_CLINIC_OR_DEPARTMENT_OTHER): Payer: Medicare Other | Admitting: Lab

## 2011-07-06 VITALS — BP 148/64 | HR 58 | Temp 97.8°F | Ht 65.5 in | Wt 124.7 lb

## 2011-07-06 DIAGNOSIS — C9030 Solitary plasmacytoma not having achieved remission: Secondary | ICD-10-CM | POA: Diagnosis not present

## 2011-07-06 DIAGNOSIS — M949 Disorder of cartilage, unspecified: Secondary | ICD-10-CM | POA: Diagnosis not present

## 2011-07-06 DIAGNOSIS — C9000 Multiple myeloma not having achieved remission: Secondary | ICD-10-CM

## 2011-07-06 DIAGNOSIS — M899 Disorder of bone, unspecified: Secondary | ICD-10-CM

## 2011-07-06 LAB — CBC WITH DIFFERENTIAL/PLATELET
BASO%: 0.2 % (ref 0.0–2.0)
Eosinophils Absolute: 0.1 10*3/uL (ref 0.0–0.5)
MCHC: 33.8 g/dL (ref 31.5–36.0)
MCV: 94.2 fL (ref 79.5–101.0)
MONO%: 10.4 % (ref 0.0–14.0)
NEUT#: 2.7 10*3/uL (ref 1.5–6.5)
RBC: 3.25 10*6/uL — ABNORMAL LOW (ref 3.70–5.45)
RDW: 15.6 % — ABNORMAL HIGH (ref 11.2–14.5)
WBC: 4.1 10*3/uL (ref 3.9–10.3)

## 2011-07-06 NOTE — Progress Notes (Signed)
Hematology and Oncology Follow Up Visit  Kayla Brooks [id number] 08-Aug-1929 78 y.o. 07/06/2011 10:15 AM  CC: Elby Showers, MD  Jesse Sans. Daleen Squibb, MD, [facility]  Radene Gunning, M.D., Ph.D.    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  She had a light chain multiple myeloma with M spike of about 0.35 gm/dL.    Prior Therapy: 1. Status post prophylactic fixation impending pathological fracture of the right femur done in August 2009. 2. External beam radiation concluded in 2010 at the right femur.  Current therapy:  She is on Revlimid 15 mg daily for the last 2 years for maintenance purposes.  Interim History:  Ms. Matsuoka presents today for a followup visit.  A 78 year old female, as mentioned, who had initially presented with plasmacytoma and found to have evidence of light chain myeloma involving the bone marrow.  She currently has been on maintenance Revlimid on a continuous basis and was able to keep her disease under control.  Her light chains have been low, especially in the kappa range of between 14 and 24, with a normal ratio.  M spike is no longer detected.  Her most recent imaging studies in August 2012, including a skeletal survey, were unremarkable.  Since the last time I saw her, she is not reporting any major toxicity related to the Revlimid.  She had not had any nausea, not had any abdominal distension.  She does report some pruritus at times, but really no rash or desquamation.  For the most part, performance status and activity level continue to be at baseline.  She had not had any back pain.  Had not had any pathological fractures.  Had not had any falls at this time. Her apatite is still good.   Medications: I have reviewed the patient's current medications. Current outpatient prescriptions:alendronate (FOSAMAX) 35 MG tablet, Take 1  tablet (35 mg total) by mouth once a week. Take in the morning with a full glass of water, on an empty stomach, and do not take anything else by mouth or lie down for the next 30 min., Disp: 5 tablet, Rfl: 11;  ALPRAZolam (XANAX) 0.25 MG tablet, Take 1 tablet po once daily or twice a day if needed for anxiety, Disp: 30 tablet, Rfl: 5 amLODipine (NORVASC) 5 MG tablet, Take 1 tablet (5 mg total) by mouth daily., Disp: 31 tablet, Rfl: 11;  aspirin 325 MG tablet, Take 1 tablet (325 mg total) by mouth 2 (two) times daily., Disp: 31 tablet, Rfl: 11;  Calcium Carbonate-Vitamin D (CALCIUM 600 + D PO), Take 2 tablets by mouth every morning.  , Disp: , Rfl:  HYDROcodone-acetaminophen (VICODIN) 5-500 MG per tablet, Take 1 tablet by mouth every 4 (four) hours as needed. For pain, Disp: 60 tablet, Rfl: 5;  lenalidomide (REVLIMID) 15 MG capsule, Take 1 capsule (15 mg total) by mouth daily. Authorization # [id number], Disp: 28 capsule, Rfl: 0;  lisinopril (PRINIVIL,ZESTRIL) 40 MG tablet, Take 1 tablet (40 mg total) by mouth daily., Disp: 31 tablet, Rfl: 11 Loperamide-Simethicone (IMODIUM ADVANCED) 2-125 MG TABS, Take 1 tablet 2 - 3 times per day as needed for diarrhea , Disp: , Rfl: ;  nitroGLYCERIN (NITROSTAT) 0.4 MG SL tablet, Place 1 tablet (0.4 mg total) under the tongue every 5 (five) minutes as needed for chest pain. Place 1 tablet under tongue for chest pain;  for every  5 minutes for a maximum of 3 times. Call doctor if pain persists., Disp: 90 tablet, Rfl: 11 omeprazole (PRILOSEC) 20 MG capsule, Take 1 capsule (20 mg total) by mouth 2 (two) times daily., Disp: 62 capsule, Rfl: 11;  POLYSACCHARIDE IRON (IFEREX 150) 150 MG CAPS, Take by mouth daily.  , Disp: , Rfl: ;  potassium chloride SA (KLOR-CON M20) 20 MEQ tablet, Take 1 tablet (20 mEq total) by mouth daily., Disp: 31 tablet, Rfl: 11;  sertraline (ZOLOFT) 25 MG tablet, Take 1 tablet (25 mg total) by mouth daily., Disp: 31 tablet, Rfl: 11 simvastatin (ZOCOR) 20 MG  tablet, Take 1 tablet (20 mg total) by mouth at bedtime., Disp: 31 tablet, Rfl: 11  Allergies: No Known Allergies  Past Medical History, Surgical history, Social history, and Family History were reviewed and updated.  Review of Systems: Constitutional:  Negative for fever, chills, night sweats, anorexia, weight loss, pain. Cardiovascular: no chest pain or dyspnea on exertion Respiratory: no cough, shortness of breath, or wheezing Neurological: no TIA or stroke symptoms Dermatological: negative ENT: negative Skin: Negative. Gastrointestinal: no abdominal pain, change in bowel habits, or black or bloody stools Genito-Urinary: no dysuria, trouble voiding, or hematuria Hematological and Lymphatic: negative Breast: negative Musculoskeletal: negative Remaining ROS negative. Physical Exam: Blood pressure 148/64, pulse 58, temperature 97.8 F (36.6 C), temperature source Oral, height 5' 5.5" (1.664 m), weight 124 lb 11.2 oz (56.564 kg). ECOG: 1 General appearance: alert Head: Normocephalic, without obvious abnormality, atraumatic Neck: no adenopathy, no carotid bruit, no JVD, supple, symmetrical, trachea midline and thyroid not enlarged, symmetric, no tenderness/mass/nodules Lymph nodes: Cervical, supraclavicular, and axillary nodes normal. Heart:regular rate and rhythm, S1, S2 normal, no murmur, click, rub or gallop Lung:chest clear, no wheezing, rales, normal symmetric air entry Abdomin: soft, non-tender, without masses or organomegaly EXT:no evidence of joint effusion   Lab Results: Lab Results  Component Value Date   WBC 4.1 07/06/2011   HGB 10.4* 07/06/2011   HCT 30.6* 07/06/2011   MCV 94.2 07/06/2011   PLT 136* 07/06/2011     Chemistry      Component Value Date/Time   NA 143 03/25/2011 1135   NA 143 03/25/2011 1135   K 3.0* 03/25/2011 1135   K 3.0* 03/25/2011 1135   CL 103 03/25/2011 1135   CL 103 03/25/2011 1135   CO2 29 03/25/2011 1135   CO2 29 03/25/2011 1135   BUN 10  03/25/2011 1135   BUN 10 03/25/2011 1135   CREATININE 0.79 03/25/2011 1135   CREATININE 0.79 03/25/2011 1135      Component Value Date/Time   CALCIUM 8.5 03/25/2011 1135   CALCIUM 8.5 03/25/2011 1135   ALKPHOS 88 03/25/2011 1135   ALKPHOS 88 03/25/2011 1135   AST 17 03/25/2011 1135   AST 17 03/25/2011 1135   ALT 10 03/25/2011 1135   ALT 10 03/25/2011 1135   BILITOT 1.1 03/25/2011 1135   BILITOT 1.1 03/25/2011 1135      Impression and Plan:  A 78 year old female with the following issues. 1. History of a plasmacytoma of the right femur.  She is status post surgical fixation followed by external beam radiation, doing very well at this time.  No evidence of any changes. 2. 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  She is currently on Revlimid.  Her kappa free light chain under reasonable control. 3. Hypokalemia.  That has resolved at this time.  Her electrolytes will  be rechecked today. 4. Bony disease.  Continue to be on calcium and vitamin D supplements.  I will hold off on bisphosphonate for the time being. 5. Follow up in 3 months.    [facility], MD 1/15/201310:15 AM

## 2011-07-08 LAB — SPEP & IFE WITH QIG
Beta 2: 5.8 % (ref 3.2–6.5)
Gamma Globulin: 15.3 % (ref 11.1–18.8)
IgA: 279 mg/dL (ref 69–380)
IgG (Immunoglobin G), Serum: 957 mg/dL (ref 690–1700)
IgM, Serum: 17 mg/dL — ABNORMAL LOW (ref 52–322)

## 2011-07-08 LAB — COMPREHENSIVE METABOLIC PANEL
Alkaline Phosphatase: 80 U/L (ref 39–117)
BUN: 12 mg/dL (ref 6–23)
Creatinine, Ser: 0.85 mg/dL (ref 0.50–1.10)
Glucose, Bld: 99 mg/dL (ref 70–99)
Sodium: 143 mEq/L (ref 135–145)
Total Bilirubin: 1.1 mg/dL (ref 0.3–1.2)
Total Protein: 5.7 g/dL — ABNORMAL LOW (ref 6.0–8.3)

## 2011-07-08 LAB — KAPPA/LAMBDA LIGHT CHAINS
Kappa:Lambda Ratio: 9.04 — ABNORMAL HIGH (ref 0.26–1.65)
Lambda Free Lght Chn: 2.18 mg/dL (ref 0.57–2.63)

## 2011-07-21 ENCOUNTER — Encounter: Payer: Self-pay | Admitting: Oncology

## 2011-07-26 ENCOUNTER — Other Ambulatory Visit: Payer: Self-pay

## 2011-07-26 NOTE — Telephone Encounter (Signed)
Received message from Dewayne Hatch at [employer], stating that new authorization number was needed for pt.'s Revlimid, as current one expired waiting for financial assistance.  Called her back [phone] and gave her new authorization number [id number].  She will contact pt.  Called pt and informed her that she needs to complete a survey, and gave her number.  Informed her [facility] will contact her to schedule shipment.  Pt verbalizes understanding.

## 2011-07-29 ENCOUNTER — Encounter: Payer: Self-pay | Admitting: *Deleted

## 2011-07-29 NOTE — Progress Notes (Signed)
ON 07/26/11 RECEIVED A FAX FROM [facility] CONCERNING A CONFIRMATION OF PRESCRIPTION SHIPMENT FOR REVLIMID.

## 2011-08-18 ENCOUNTER — Other Ambulatory Visit: Payer: Self-pay | Admitting: *Deleted

## 2011-08-18 DIAGNOSIS — C9000 Multiple myeloma not having achieved remission: Secondary | ICD-10-CM

## 2011-08-18 MED ORDER — LENALIDOMIDE 15 MG PO CAPS: 15.0000 mg | ORAL_CAPSULE | Freq: Every day | ORAL | Status: DC

## 2011-08-25 NOTE — Telephone Encounter (Signed)
[facility] faxed confirmation of Revlimid prescription shipment.  Shipped 08-23-11 with next business day delivery.

## 2011-09-21 ENCOUNTER — Other Ambulatory Visit: Payer: Self-pay | Admitting: *Deleted

## 2011-09-21 DIAGNOSIS — C9000 Multiple myeloma not having achieved remission: Secondary | ICD-10-CM

## 2011-09-21 MED ORDER — LENALIDOMIDE 15 MG PO CAPS: 15.0000 mg | ORAL_CAPSULE | Freq: Every day | ORAL | Status: DC

## 2011-10-05 ENCOUNTER — Ambulatory Visit (HOSPITAL_BASED_OUTPATIENT_CLINIC_OR_DEPARTMENT_OTHER): Payer: Medicare Other | Admitting: Oncology

## 2011-10-05 ENCOUNTER — Telehealth: Payer: Self-pay | Admitting: Oncology

## 2011-10-05 ENCOUNTER — Other Ambulatory Visit (HOSPITAL_BASED_OUTPATIENT_CLINIC_OR_DEPARTMENT_OTHER): Payer: Medicare Other | Admitting: Lab

## 2011-10-05 ENCOUNTER — Other Ambulatory Visit: Payer: Self-pay | Admitting: Oncology

## 2011-10-05 ENCOUNTER — Other Ambulatory Visit: Payer: Self-pay | Admitting: *Deleted

## 2011-10-05 VITALS — BP 137/76 | HR 54 | Temp 98.6°F | Ht 65.5 in | Wt 117.7 lb

## 2011-10-05 DIAGNOSIS — C9000 Multiple myeloma not having achieved remission: Secondary | ICD-10-CM

## 2011-10-05 DIAGNOSIS — E876 Hypokalemia: Secondary | ICD-10-CM

## 2011-10-05 LAB — COMPREHENSIVE METABOLIC PANEL
ALT: 11 U/L (ref 0–35)
Albumin: 3.3 g/dL — ABNORMAL LOW (ref 3.5–5.2)
Alkaline Phosphatase: 89 U/L (ref 39–117)
CO2: 30 mEq/L (ref 19–32)
Potassium: 2.5 mEq/L — CL (ref 3.5–5.3)
Sodium: 143 mEq/L (ref 135–145)
Total Bilirubin: 1 mg/dL (ref 0.3–1.2)
Total Protein: 6.3 g/dL (ref 6.0–8.3)

## 2011-10-05 LAB — CBC WITH DIFFERENTIAL/PLATELET
Eosinophils Absolute: 0.1 10*3/uL (ref 0.0–0.5)
HGB: 10.7 g/dL — ABNORMAL LOW (ref 11.6–15.9)
MCV: 95.6 fL (ref 79.5–101.0)
MONO%: 8.7 % (ref 0.0–14.0)
NEUT#: 1.8 10*3/uL (ref 1.5–6.5)
RBC: 3.36 10*6/uL — ABNORMAL LOW (ref 3.70–5.45)
RDW: 14.9 % — ABNORMAL HIGH (ref 11.2–14.5)
WBC: 3.3 10*3/uL — ABNORMAL LOW (ref 3.9–10.3)
lymph#: 1.1 10*3/uL (ref 0.9–3.3)
nRBC: 0 % (ref 0–0)

## 2011-10-05 MED ORDER — POTASSIUM CHLORIDE CRYS ER 20 MEQ PO TBCR: 20.0000 meq | EXTENDED_RELEASE_TABLET | Freq: Every day | ORAL | Status: DC

## 2011-10-05 NOTE — Progress Notes (Signed)
Hematology and Oncology Follow Up Visit  Kayla Brooks [id number] Nov 15, 1929 78 y.o. 10/05/2011 4:06 PM  CC: Elby Showers, MD  Jesse Sans. Daleen Squibb, MD, [facility]  Radene Gunning, M.D., Ph.D.    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  She had a light chain multiple myeloma with M spike of about 0.35 gm/dL.   Prior Therapy: 1. Status post prophylactic fixation impending pathological fracture of the right femur done in August 2009. 2. External beam radiation concluded in 2010 at the right femur.  Current therapy:  She is on Revlimid 15 mg daily for the last 2 years for maintenance purposes.  Interim History:  Kayla Brooks presents today for a followup visit. She is  an 78 year old female, as mentioned, who had initially presented with plasmacytoma and found to have evidence of light chain myeloma involving the bone marrow.  She currently has been on maintenance Revlimid on a continuous basis and was able to keep her disease under control. Since the last time I saw her, she is not reporting any major toxicity related to the Revlimid.  She had not had any nausea, not had any abdominal distension.  She does report some pruritus at times, but really no rash or desquamation.  For the most part, performance status and activity level continue to be at baseline.  She had not had any back pain.  Had not had any pathological fractures.  Had not had any falls at this time. Her apatite is still good. She has reported some itching at times and some loose bowel movements but no diarrhia.    Medications: I have reviewed the patient's current medications. Current outpatient prescriptions:alendronate (FOSAMAX) 35 MG tablet, Take 1 tablet (35 mg total) by mouth once a week. Take in the morning with a full glass of water, on an empty stomach, and do not take anything else by  mouth or lie down for the next 30 min., Disp: 5 tablet, Rfl: 11;  ALPRAZolam (XANAX) 0.25 MG tablet, Take 1 tablet po once daily or twice a day if needed for anxiety, Disp: 30 tablet, Rfl: 5 amLODipine (NORVASC) 5 MG tablet, Take 1 tablet (5 mg total) by mouth daily., Disp: 31 tablet, Rfl: 11;  aspirin 325 MG tablet, Take 1 tablet (325 mg total) by mouth 2 (two) times daily., Disp: 31 tablet, Rfl: 11;  Calcium Carbonate-Vitamin D (CALCIUM 600 + D PO), Take 2 tablets by mouth every morning.  , Disp: , Rfl:  HYDROcodone-acetaminophen (VICODIN) 5-500 MG per tablet, Take 1 tablet by mouth every 4 (four) hours as needed. For pain, Disp: 60 tablet, Rfl: 5;  lenalidomide (REVLIMID) 15 MG capsule, Take 1 capsule (15 mg total) by mouth daily. Authorization # [id number], Disp: 28 capsule, Rfl: 0;  lisinopril (PRINIVIL,ZESTRIL) 40 MG tablet, Take 1 tablet (40 mg total) by mouth daily., Disp: 31 tablet, Rfl: 11 Loperamide-Simethicone (IMODIUM ADVANCED) 2-125 MG TABS, Take 1 tablet 2 - 3 times per day as needed for diarrhea , Disp: , Rfl: ;  nitroGLYCERIN (NITROSTAT) 0.4 MG SL tablet, Place 1 tablet (0.4 mg total) under the tongue every 5 (five) minutes as needed for chest pain. Place 1 tablet under tongue for chest pain;  for every  5 minutes for a maximum of 3 times. Call doctor if pain persists., Disp: 90 tablet, Rfl: 11 omeprazole (PRILOSEC) 20 MG capsule, Take  1 capsule (20 mg total) by mouth 2 (two) times daily., Disp: 62 capsule, Rfl: 11;  POLYSACCHARIDE IRON (IFEREX 150) 150 MG CAPS, Take by mouth daily.  , Disp: , Rfl: ;  potassium chloride SA (KLOR-CON M20) 20 MEQ tablet, Take 1 tablet (20 mEq total) by mouth daily., Disp: 31 tablet, Rfl: 11;  sertraline (ZOLOFT) 25 MG tablet, Take 1 tablet (25 mg total) by mouth daily., Disp: 31 tablet, Rfl: 11 simvastatin (ZOCOR) 20 MG tablet, Take 1 tablet (20 mg total) by mouth at bedtime., Disp: 31 tablet, Rfl: 11  Allergies: No Known Allergies  Past Medical History,  Surgical history, Social history, and Family History were reviewed and updated.  Review of Systems: Constitutional:  Negative for fever, chills, night sweats, anorexia, weight loss, pain. Cardiovascular: no chest pain or dyspnea on exertion Respiratory: no cough, shortness of breath, or wheezing Neurological: no TIA or stroke symptoms Dermatological: negative ENT: negative Skin: Negative. Gastrointestinal: no abdominal pain, change in bowel habits, or black or bloody stools Genito-Urinary: no dysuria, trouble voiding, or hematuria Hematological and Lymphatic: negative Breast: negative Musculoskeletal: negative Remaining ROS negative. Physical Exam: Blood pressure 137/76, pulse 54, temperature 98.6 F (37 C), temperature source Oral, height 5' 5.5" (1.664 m), weight 117 lb 11.2 oz (53.388 kg). ECOG: 1 General appearance: alert Head: Normocephalic, without obvious abnormality, atraumatic Neck: no adenopathy, no carotid bruit, no JVD, supple, symmetrical, trachea midline and thyroid not enlarged, symmetric, no tenderness/mass/nodules Lymph nodes: Cervical, supraclavicular, and axillary nodes normal. Heart:regular rate and rhythm, S1, S2 normal, no murmur, click, rub or gallop Lung:chest clear, no wheezing, rales, normal symmetric air entry Abdomin: soft, non-tender, without masses or organomegaly EXT:no evidence of joint effusion   Lab Results: Lab Results  Component Value Date   WBC 3.3* 10/05/2011   HGB 10.7* 10/05/2011   HCT 32.1* 10/05/2011   MCV 95.6 10/05/2011   PLT 154 10/05/2011   Results for Kayla Brooks, Kayla Brooks (MRN [id number]) as of 10/05/2011 16:08  Ref. Range 07/06/2011 09:46  Kappa free light chain Latest Range: 0.33-1.94 mg/dL 93.71 (H)  Kappa:Lambda Ratio Latest Range: 0.26-1.65  9.04 (H)  Lambda Free Lght Chn Latest Range: 0.57-2.63 mg/dL 6.96  Albumin ELP Latest Range: 55.8-66.1 % 53.7 (L)  COMMENT (PROTEIN ELECTROPHOR) No range found *  Alpha-1-Globulin Latest Range:  2.9-4.9 % 6.3 (H)  Alpha-2-Globulin Latest Range: 7.1-11.8 % 12.3 (H)  Beta Globulin Latest Range: 4.7-7.2 % 6.6  Beta 2 Latest Range: 3.2-6.5 % 5.8  Gamma Globulin Latest Range: 11.1-18.8 % 15.3  M-SPIKE, % No range found NOT DET  SPE Interp. No range found *  IgG (Immunoglobin G), Serum Latest Range: [id number] mg/dL 789  IgA Latest Range: 69-380 mg/dL 381  IgM, Serum Latest Range: 52-322 mg/dL 17 (L)  Total Protein, serum electrophor Latest Range: 6.0-8.3 g/dL 5.7 (L)    Impression and Plan:  A 78 year old female with the following issues. 1. History of a plasmacytoma of the right femur.  She is status post surgical fixation followed by external beam radiation, doing very well at this time.  No evidence of any changes. 2. 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  She is currently on Revlimid.  Her kappa free light chain under reasonable control. 3. Hypokalemia.  That has resolved at this time.  Her electrolytes will be rechecked today. 4. Bony disease.  Continue to be on calcium and vitamin D supplements.  I will hold off on bisphosphonate for the  time being. 5. Follow up in 3 months with repeat skeletal survey.     [facility], MD 4/16/20134:06 PM

## 2011-10-05 NOTE — Telephone Encounter (Signed)
Called in script for potassium to [facility] [address], lmovm for patient on home and mobile phone to start taking tonight.

## 2011-10-05 NOTE — Telephone Encounter (Signed)
appts made and printed   aom 

## 2011-10-06 ENCOUNTER — Encounter: Payer: Self-pay | Admitting: *Deleted

## 2011-10-06 NOTE — Progress Notes (Signed)
No note

## 2011-10-06 NOTE — Progress Notes (Signed)
Spoke with patient this am, she has potassium at her home, and will take 20 meq daily.

## 2011-10-07 LAB — SPEP & IFE WITH QIG
Albumin ELP: 54.5 % — ABNORMAL LOW (ref 55.8–66.1)
Alpha-2-Globulin: 11.6 % (ref 7.1–11.8)
Beta Globulin: 6.5 % (ref 4.7–7.2)
IgG (Immunoglobin G), Serum: 1040 mg/dL (ref 690–1700)
Total Protein, Serum Electrophoresis: 6.1 g/dL (ref 6.0–8.3)

## 2011-10-07 LAB — KAPPA/LAMBDA LIGHT CHAINS
Kappa:Lambda Ratio: 11.14 — ABNORMAL HIGH (ref 0.26–1.65)
Lambda Free Lght Chn: 2.19 mg/dL (ref 0.57–2.63)

## 2011-10-19 ENCOUNTER — Other Ambulatory Visit: Payer: Self-pay | Admitting: *Deleted

## 2011-10-19 NOTE — Telephone Encounter (Signed)
THIS REQUEST FOR REVLIMID WAS PLACED IN DR.SHADAD'S RED ACTIVE WORK FOLDER.

## 2011-10-19 NOTE — Progress Notes (Signed)
Auth # obtained from [employer] for Revlimid, [id number]; RF faxed to [facility].

## 2011-10-26 NOTE — Telephone Encounter (Signed)
RECEIVED A FAX FROM [facility] CONCERNING A CONFIRMATION OF PRESCRIPTION SHIPMENT FOR REVLIMID. 

## 2011-11-19 ENCOUNTER — Other Ambulatory Visit: Payer: Self-pay | Admitting: *Deleted

## 2011-11-19 ENCOUNTER — Telehealth: Payer: Self-pay | Admitting: *Deleted

## 2011-11-19 DIAGNOSIS — C9000 Multiple myeloma not having achieved remission: Secondary | ICD-10-CM

## 2011-11-19 MED ORDER — LENALIDOMIDE 15 MG PO CAPS: 15.0000 mg | ORAL_CAPSULE | Freq: Every day | ORAL | Status: DC

## 2011-11-19 NOTE — Telephone Encounter (Signed)
Received confirmation of fax receipt from [facility] stating referral received & will be in touch once insurance verification & delivery arrangements made.

## 2011-12-03 ENCOUNTER — Telehealth: Payer: Self-pay | Admitting: Medical Oncology

## 2011-12-03 NOTE — Telephone Encounter (Signed)
revlimid to be delivered today 

## 2011-12-28 ENCOUNTER — Other Ambulatory Visit: Payer: Self-pay | Admitting: *Deleted

## 2011-12-28 DIAGNOSIS — C9000 Multiple myeloma not having achieved remission: Secondary | ICD-10-CM

## 2011-12-28 MED ORDER — LENALIDOMIDE 15 MG PO CAPS: ORAL_CAPSULE | ORAL | Status: DC

## 2011-12-28 NOTE — Addendum Note (Signed)
Addended by: Arvilla Meres on: 12/28/2011 03:49 PM   Modules accepted: Orders

## 2011-12-28 NOTE — Telephone Encounter (Signed)
THIS REFILL REQUEST FOR REVLIMID WAS PLACED IN DR.SHADAD'S ACTIVE WORK FOLDER. 

## 2011-12-29 NOTE — Telephone Encounter (Signed)
Fax from [facility]  saying they will be in touch once they have verified the insurance coverage and made delivery arrangements for the patient.

## 2012-01-04 ENCOUNTER — Inpatient Hospital Stay (HOSPITAL_COMMUNITY): Admission: RE | Admit: 2012-01-04 | Payer: Medicare Other | Source: Ambulatory Visit

## 2012-01-04 ENCOUNTER — Other Ambulatory Visit: Payer: Medicare Other | Admitting: Lab

## 2012-01-04 NOTE — Telephone Encounter (Signed)
RECEIVED A FAX FROM [facility] CONCERNING A CONFIRMATION OF PRESCRIPTION SHIPMENT FOR REVLIMID. 

## 2012-01-05 ENCOUNTER — Telehealth: Payer: Self-pay | Admitting: Oncology

## 2012-01-05 NOTE — Telephone Encounter (Signed)
S/w pt today re new appt for lb/bone survey 7/18 @ 12:15 pm for lb and 1 pm for xray. appts r/s from 7/16 per pt request.

## 2012-01-06 ENCOUNTER — Ambulatory Visit (HOSPITAL_COMMUNITY)
Admission: RE | Admit: 2012-01-06 | Discharge: 2012-01-06 | Disposition: A | Discharge status: Home / Self Care | Payer: Medicare Other | Source: Ambulatory Visit | Attending: Oncology | Admitting: Oncology

## 2012-01-06 ENCOUNTER — Other Ambulatory Visit (HOSPITAL_BASED_OUTPATIENT_CLINIC_OR_DEPARTMENT_OTHER): Payer: Medicare Other | Admitting: Lab

## 2012-01-06 DIAGNOSIS — M129 Arthropathy, unspecified: Secondary | ICD-10-CM | POA: Insufficient documentation

## 2012-01-06 DIAGNOSIS — K8020 Calculus of gallbladder without cholecystitis without obstruction: Secondary | ICD-10-CM | POA: Diagnosis not present

## 2012-01-06 DIAGNOSIS — C9000 Multiple myeloma not having achieved remission: Secondary | ICD-10-CM | POA: Insufficient documentation

## 2012-01-06 DIAGNOSIS — M4120 Other idiopathic scoliosis, site unspecified: Secondary | ICD-10-CM | POA: Insufficient documentation

## 2012-01-06 DIAGNOSIS — Q762 Congenital spondylolisthesis: Secondary | ICD-10-CM | POA: Diagnosis not present

## 2012-01-06 LAB — CBC WITH DIFFERENTIAL/PLATELET
Basophils Absolute: 0 10*3/uL (ref 0.0–0.1)
Eosinophils Absolute: 0.1 10*3/uL (ref 0.0–0.5)
HCT: 32.4 % — ABNORMAL LOW (ref 34.8–46.6)
HGB: 10.8 g/dL — ABNORMAL LOW (ref 11.6–15.9)
MONO#: 0.4 10*3/uL (ref 0.1–0.9)
NEUT#: 2.3 10*3/uL (ref 1.5–6.5)
RDW: 13.4 % (ref 11.2–14.5)
lymph#: 1 10*3/uL (ref 0.9–3.3)

## 2012-01-10 LAB — COMPREHENSIVE METABOLIC PANEL
AST: 14 U/L (ref 0–37)
Albumin: 3.6 g/dL (ref 3.5–5.2)
BUN: 14 mg/dL (ref 6–23)
CO2: 26 mEq/L (ref 19–32)
Calcium: 8.7 mg/dL (ref 8.4–10.5)
Chloride: 107 mEq/L (ref 96–112)
Glucose, Bld: 87 mg/dL (ref 70–99)
Potassium: 3.8 mEq/L (ref 3.5–5.3)

## 2012-01-10 LAB — SPEP & IFE WITH QIG
Alpha-1-Globulin: 5.4 % — ABNORMAL HIGH (ref 2.9–4.9)
Beta 2: 5.5 % (ref 3.2–6.5)
Gamma Globulin: 15.6 % (ref 11.1–18.8)
IgA: 246 mg/dL (ref 69–380)
IgG (Immunoglobin G), Serum: 986 mg/dL (ref 690–1700)
IgM, Serum: 16 mg/dL — ABNORMAL LOW (ref 52–322)

## 2012-01-11 ENCOUNTER — Telehealth: Payer: Self-pay | Admitting: *Deleted

## 2012-01-11 ENCOUNTER — Ambulatory Visit (HOSPITAL_BASED_OUTPATIENT_CLINIC_OR_DEPARTMENT_OTHER): Payer: Medicare Other | Admitting: Oncology

## 2012-01-11 VITALS — BP 130/72 | HR 56 | Temp 98.5°F | Ht 65.5 in | Wt 116.6 lb

## 2012-01-11 DIAGNOSIS — C9000 Multiple myeloma not having achieved remission: Secondary | ICD-10-CM | POA: Diagnosis not present

## 2012-01-11 NOTE — Telephone Encounter (Signed)
Gave patient appointment 04-12-2012 starting at 2:45pm printed calendar and gave to the patient

## 2012-01-11 NOTE — Progress Notes (Signed)
Hematology and Oncology Follow Up Visit  Kayla Brooks [id number] 07/05/1929 78 y.o. 01/11/2012 4:18 PM  CC: Elby Showers, MD  Jesse Sans. Daleen Squibb, MD, [facility]  Radene Gunning, M.D., Ph.D.    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  She had a light chain multiple myeloma with M spike of about 0.35 gm/dL.   Prior Therapy: 1. Status post prophylactic fixation impending pathological fracture of the right femur done in August 2009. 2. External beam radiation concluded in 2010 at the right femur.  Current therapy:  She is on Revlimid 15 mg daily for the last 2 years for maintenance purposes.  Interim History:  Kayla Brooks presents today for a followup visit. She is  an 78 year old female, as mentioned, who had initially presented with plasmacytoma and found to have evidence of light chain myeloma involving the bone marrow.  She currently has been on maintenance Revlimid on a continuous basis and was able to keep her disease under control. Since the last time I saw her, she is not reporting any major toxicity related to the Revlimid.  She had not had any nausea, not had any abdominal distension.  She does report some pruritus at times, but really no rash or desquamation.  For the most part, performance status and activity level continue to be at baseline.  She had not had any back pain.  Had not had any pathological fractures.  Had not had any falls at this time. Her apatite is still good. She has reported some itching at times and some loose bowel movements and diarrhea at some times.    Medications: I have reviewed the patient's current medications. Current outpatient prescriptions:alendronate (FOSAMAX) 35 MG tablet, Take 1 tablet (35 mg total) by mouth once a week. Take in the morning with a full glass of water, on an empty stomach, and do not take anything  else by mouth or lie down for the next 30 min., Disp: 5 tablet, Rfl: 11;  ALPRAZolam (XANAX) 0.25 MG tablet, Take 1 tablet po once daily or twice a day if needed for anxiety, Disp: 30 tablet, Rfl: 5 amLODipine (NORVASC) 5 MG tablet, Take 1 tablet (5 mg total) by mouth daily., Disp: 31 tablet, Rfl: 11;  aspirin 325 MG tablet, Take 1 tablet (325 mg total) by mouth 2 (two) times daily., Disp: 31 tablet, Rfl: 11;  Calcium Carbonate-Vitamin D (CALCIUM 600 + D PO), Take 2 tablets by mouth every morning.  , Disp: , Rfl:  HYDROcodone-acetaminophen (VICODIN) 5-500 MG per tablet, Take 1 tablet by mouth every 4 (four) hours as needed. For pain, Disp: 60 tablet, Rfl: 5;  lenalidomide (REVLIMID) 15 MG capsule, TAKE ONE CAPSULE BY MOUTH DAILY, Disp: 28 capsule, Rfl: 0;  lisinopril (PRINIVIL,ZESTRIL) 40 MG tablet, Take 1 tablet (40 mg total) by mouth daily., Disp: 31 tablet, Rfl: 11 Loperamide-Simethicone (IMODIUM ADVANCED) 2-125 MG TABS, Take 1 tablet 2 - 3 times per day as needed for diarrhea , Disp: , Rfl: ;  nitroGLYCERIN (NITROSTAT) 0.4 MG SL tablet, Place 1 tablet (0.4 mg total) under the tongue every 5 (five) minutes as needed for chest pain. Place 1 tablet under tongue for chest pain;  for every  5 minutes for a maximum of 3 times. Call doctor if pain persists., Disp: 90 tablet, Rfl: 11 omeprazole (PRILOSEC) 20 MG capsule, Take 1 capsule (20 mg  total) by mouth 2 (two) times daily., Disp: 62 capsule, Rfl: 11;  POLYSACCHARIDE IRON (IFEREX 150) 150 MG CAPS, Take by mouth daily.  , Disp: , Rfl: ;  potassium chloride SA (KLOR-CON M20) 20 MEQ tablet, Take 1 tablet (20 mEq total) by mouth daily., Disp: 30 tablet, Rfl: 1;  sertraline (ZOLOFT) 25 MG tablet, Take 1 tablet (25 mg total) by mouth daily., Disp: 31 tablet, Rfl: 11 simvastatin (ZOCOR) 20 MG tablet, Take 1 tablet (20 mg total) by mouth at bedtime., Disp: 31 tablet, Rfl: 11  Allergies: No Known Allergies  Past Medical History, Surgical history, Social history, and  Family History were reviewed and updated.  Review of Systems: Constitutional:  Negative for fever, chills, night sweats, anorexia, weight loss, pain. Cardiovascular: no chest pain or dyspnea on exertion Respiratory: no cough, shortness of breath, or wheezing Neurological: no TIA or stroke symptoms Dermatological: negative ENT: negative Skin: Negative. Gastrointestinal: no abdominal pain, change in bowel habits, or black or bloody stools Genito-Urinary: no dysuria, trouble voiding, or hematuria Hematological and Lymphatic: negative Breast: negative Musculoskeletal: negative Remaining ROS negative. Physical Exam: Blood pressure 130/72, pulse 56, temperature 98.5 F (36.9 C), temperature source Oral, height 5' 5.5" (1.664 m), weight 116 lb 9.6 oz (52.889 kg). ECOG: 1 General appearance: alert Head: Normocephalic, without obvious abnormality, atraumatic Neck: no adenopathy, no carotid bruit, no JVD, supple, symmetrical, trachea midline and thyroid not enlarged, symmetric, no tenderness/mass/nodules Lymph nodes: Cervical, supraclavicular, and axillary nodes normal. Heart:regular rate and rhythm, S1, S2 normal, no murmur, click, rub or gallop Lung:chest clear, no wheezing, rales, normal symmetric air entry Abdomin: soft, non-tender, without masses or organomegaly EXT:no evidence of joint effusion   Lab Results: Lab Results  Component Value Date   WBC 3.9 01/06/2012   HGB 10.8* 01/06/2012   HCT 32.4* 01/06/2012   MCV 96.5 01/06/2012   PLT 155 01/06/2012   Results for AESHA, AGRAWAL (MRN [id number]) as of 01/11/2012 16:03  Ref. Range 10/05/2011 15:27 01/06/2012 12:01  Kappa free light chain Latest Range: 0.33-1.94 mg/dL 40.98 (H) 11.91 (H)  Kappa:Lambda Ratio Latest Range: 0.26-1.65  11.14 (H) 15.57 (H)  Lambda Free Lght Chn Latest Range: 0.57-2.63 mg/dL 4.78 2.95  METASTATIC BONE SURVEY  Comparison: Bone survey 02/10/2011, 07/10/2010 and 12/30/2009.  Findings: No lytic or sclerotic  lesion is seen. No fracture is  identified. Multilevel cervical degenerative change is present.  Thoracolumbar scoliosis is again identified. Grade 1  anterolisthesis of L5 on S1 due to facet arthropathy is again  noted. Gallstone is again noted. Gamma nail in the right hip is  again seen with unchanged surrounding heterotopic ossification.  IMPRESSION:  No plain film evidence of multiple myeloma. No acute abnormality.  Stable compared to prior exam.   Impression and Plan:  A 78 year old female with the following issues. 1. History of a plasmacytoma of the right femur.  She is status post surgical fixation followed by external beam radiation, doing very well at this time.  No evidence of any changes. 2. 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  She is currently on Revlimid.  Her kappa free light chain under reasonable control. Skeletal survey reviewed and show no new lesions.  3. Hypokalemia.  That has resolved at this time.  Her electrolytes will be rechecked today. 4. Bony disease.  Continue to be on calcium and vitamin D supplements.  I will hold off on bisphosphonate for the time being. 5. Follow up in  3 months.     [facility], MD 7/23/20134:18 PM

## 2012-01-25 ENCOUNTER — Other Ambulatory Visit: Payer: Self-pay | Admitting: Internal Medicine

## 2012-01-27 ENCOUNTER — Telehealth: Payer: Self-pay | Admitting: *Deleted

## 2012-01-27 ENCOUNTER — Other Ambulatory Visit: Payer: Self-pay | Admitting: *Deleted

## 2012-01-27 DIAGNOSIS — C9000 Multiple myeloma not having achieved remission: Secondary | ICD-10-CM

## 2012-01-27 MED ORDER — LENALIDOMIDE 15 MG PO CAPS: ORAL_CAPSULE | ORAL | Status: DC

## 2012-01-27 NOTE — Telephone Encounter (Signed)
[facility] faxed refill request for Revlimid.  Request to MD for review. 

## 2012-01-27 NOTE — Telephone Encounter (Signed)
[facility] faxed confirmation of facsimile receipt.  With this referral will verify insurance and make delivery arrangements.

## 2012-01-27 NOTE — Telephone Encounter (Signed)
Xanax rx called to [facility]. 

## 2012-01-27 NOTE — Telephone Encounter (Signed)
Called patient, gave the [employer] number with instructions to take the survey for refill to be authorized.

## 2012-01-31 ENCOUNTER — Other Ambulatory Visit: Payer: Self-pay | Admitting: Internal Medicine

## 2012-02-01 NOTE — Telephone Encounter (Signed)
RECEIVED A FAX FROM [facility] CONCERNING A CONFIRMATION OF PRESCRIPTION SHIPMENT FOR REVLIMID ON 01/31/12. 

## 2012-02-01 NOTE — Telephone Encounter (Signed)
Please make sure he has an appointment to be seen in clinic.  Its been a long while.

## 2012-02-06 ENCOUNTER — Other Ambulatory Visit: Payer: Self-pay | Admitting: Internal Medicine

## 2012-02-18 ENCOUNTER — Other Ambulatory Visit: Payer: Self-pay | Admitting: Internal Medicine

## 2012-02-25 ENCOUNTER — Encounter: Payer: Self-pay | Admitting: Internal Medicine

## 2012-02-25 ENCOUNTER — Other Ambulatory Visit: Payer: Self-pay | Admitting: *Deleted

## 2012-02-25 ENCOUNTER — Ambulatory Visit (INDEPENDENT_AMBULATORY_CARE_PROVIDER_SITE_OTHER): Payer: Medicare Other | Admitting: Internal Medicine

## 2012-02-25 VITALS — BP 134/75 | HR 54 | Temp 97.7°F | Ht 65.0 in | Wt 120.9 lb

## 2012-02-25 DIAGNOSIS — C9000 Multiple myeloma not having achieved remission: Secondary | ICD-10-CM

## 2012-02-25 DIAGNOSIS — I10 Essential (primary) hypertension: Secondary | ICD-10-CM | POA: Diagnosis not present

## 2012-02-25 DIAGNOSIS — E785 Hyperlipidemia, unspecified: Secondary | ICD-10-CM

## 2012-02-25 LAB — LIPID PANEL
HDL: 60 mg/dL (ref >39)
Total CHOL/HDL Ratio: 2.1 Ratio
Triglycerides: 83 mg/dL (ref <150)

## 2012-02-25 MED ORDER — AMLODIPINE BESYLATE 5 MG PO TABS: 5.0000 mg | ORAL_TABLET | Freq: Every day | ORAL | Status: DC

## 2012-02-25 MED ORDER — LENALIDOMIDE 15 MG PO CAPS: ORAL_CAPSULE | ORAL | Status: DC

## 2012-02-25 NOTE — Progress Notes (Signed)
Subjective:   Patient ID: Kayla Brooks female   DOB: 22-Feb-1930 78 y.o.   MRN: [id number]  HPI: [name] is a 78 y.o. man who presents today for follow up on his chronic medical conditions including hypertension and hyperlipidemia.  He is currently undergoing treatment for multiple myeloma by Dr. Clelia Croft.  He states that he has been feeling well.  He was prescribed Xanax for anxiety associated with his treatment.    He is due for a follow up lipid profile today.  He has been taking his simvastatin as prescribed.  He is not a diabetic so his goal would be an LDL less then 130.   Past Medical History  Diagnosis Date  . Multiple myeloma without mention of remission   . Monoclonal paraproteinemia    Current Outpatient Prescriptions  Medication Sig Dispense Refill  . alendronate (FOSAMAX) 35 MG tablet Take 1 tablet (35 mg total) by mouth once a week. Take in the morning with a full glass of water, on an empty stomach, and do not take anything else by mouth or lie down for the next 30 min.  5 tablet  11  . ALPRAZolam (XANAX) 0.25 MG tablet Take 1 tablet (0.25 mg total) by mouth 2 (two) times daily as needed for anxiety. Or throat tightness  30 tablet  3  . amLODipine (NORVASC) 5 MG tablet Take 1 tablet (5 mg total) by mouth daily.  31 tablet  11  . aspirin 325 MG tablet Take 1 tablet (325 mg total) by mouth 2 (two) times daily.  31 tablet  11  . Calcium Carbonate-Vitamin D (CALCIUM 600 + D PO) Take 2 tablets by mouth every morning.        Marland Kitchen HYDROcodone-acetaminophen (VICODIN) 5-500 MG per tablet Take 1 tablet by mouth every 4 (four) hours as needed. For pain  60 tablet  5  . lenalidomide (REVLIMID) 15 MG capsule TAKE ONE CAPSULE BY MOUTH DAILY  28 capsule  0  . lisinopril (PRINIVIL,ZESTRIL) 40 MG tablet take 1 tablet by mouth once daily  31 tablet  11  . Loperamide-Simethicone (IMODIUM ADVANCED) 2-125 MG TABS Take 1 tablet 2 - 3 times per day as needed for diarrhea       . nitroGLYCERIN  (NITROSTAT) 0.4 MG SL tablet Place 1 tablet (0.4 mg total) under the tongue every 5 (five) minutes as needed for chest pain. Place 1 tablet under tongue for chest pain;  for every  5 minutes for a maximum of 3 times. Call doctor if pain persists.  90 tablet  11  . omeprazole (PRILOSEC) 20 MG capsule take 1 capsule by mouth twice a day  62 capsule  11  . POLYSACCHARIDE IRON (IFEREX 150) 150 MG CAPS Take by mouth daily.        . potassium chloride SA (KLOR-CON M20) 20 MEQ tablet Take 1 tablet (20 mEq total) by mouth daily.  30 tablet  1  . sertraline (ZOLOFT) 25 MG tablet Take 1 tablet (25 mg total) by mouth daily.  31 tablet  11  . simvastatin (ZOCOR) 20 MG tablet Take 1 tablet (20 mg total) by mouth at bedtime.  31 tablet  11  . DISCONTD: lenalidomide (REVLIMID) 15 MG capsule TAKE ONE CAPSULE BY MOUTH DAILY  28 capsule  0   No family history on file. History   Social History  . Marital Status: Widowed    Spouse Name: N/A    Number of Children: N/A  .  Years of Education: N/A   Social History Main Topics  . Smoking status: Former Games developer  . Smokeless tobacco: None  . Alcohol Use: No  . Drug Use: No  . Sexually Active: None   Other Topics Concern  . None   Social History Narrative  . None   Review of Systems: Constitutional: Denies fever, chills, diaphoresis, appetite change and fatigue.  HEENT: Denies photophobia, eye pain, redness, hearing loss, ear pain, congestion, sore throat, rhinorrhea, sneezing, mouth sores, trouble swallowing, neck pain, neck stiffness and tinnitus.   Respiratory: Denies SOB, DOE, cough, chest tightness,  and wheezing.   Cardiovascular: Denies chest pain, palpitations and leg swelling.  Gastrointestinal: Denies nausea, vomiting, abdominal pain, diarrhea, constipation, blood in stool and abdominal distention.  Genitourinary: Denies dysuria, urgency, frequency, hematuria, flank pain and difficulty urinating.  Musculoskeletal: Denies myalgias, back pain, joint  swelling, arthralgias and gait problem.  Skin: Denies pallor, rash and wound.  Neurological: Denies dizziness, seizures, syncope, weakness, light-headedness, numbness and headaches.  Hematological: Denies adenopathy. Easy bruising, personal or family bleeding history  Psychiatric/Behavioral: Denies suicidal ideation, mood changes, confusion, nervousness, sleep disturbance and agitation  Objective:  Physical Exam: Filed Vitals:   02/25/12 1532  BP: 134/75  Pulse: 54  Temp: 97.7 F (36.5 C)  TempSrc: Oral  Height: 5\' 5"  (1.651 m)  Weight: 120 lb 14.4 oz (54.84 kg)  SpO2: 99%   Constitutional: Vital signs reviewed.  Patient is a well-developed and well-nourished man in no acute distress and cooperative with exam. Alert and oriented x3.  Head: Normocephalic and atraumatic Ear: TM normal bilaterally Mouth: no erythema or exudates, MMM Eyes: PERRL, EOMI, conjunctivae normal, No scleral icterus.  Neck: Supple, Trachea midline normal ROM, No JVD, mass, thyromegaly, or carotid bruit present.  Cardiovascular: RRR, S1 normal, S2 normal, no MRG, pulses symmetric and intact bilaterally Pulmonary/Chest: CTAB, no wheezes, rales, or rhonchi Abdominal: Soft. Non-tender, non-distended, bowel sounds are normal, no masses, organomegaly, or guarding present.  GU: no CVA tenderness Musculoskeletal: No joint deformities, erythema, or stiffness, ROM full and no nontender Hematology: no cervical, inginal, or axillary adenopathy.  Neurological: A&O x3, Strength is normal and symmetric bilaterally, cranial nerve II-XII are grossly intact, no focal motor deficit, sensory intact to light touch bilaterally.  Skin: Warm, dry and intact. No rash, cyanosis, or clubbing.  Psychiatric: Normal mood and affect. speech and behavior is normal. Judgment and thought content normal. Cognition and memory are normal.   Assessment & Plan:

## 2012-02-25 NOTE — Telephone Encounter (Signed)
RECEIVED A FAX FROM [facility] CONCERNING A CONFIRMATION OF FACSIMILE RECEIPT FOR PT. REFERRAL. 

## 2012-02-25 NOTE — Patient Instructions (Signed)
1.  Continue taking your medications as prescribed.  2.  We will consider the Tetanus shot next time for health maintenance.  That will also have the booster for Whooping cough.  3.  Follow up with me in about 3-6 months.

## 2012-02-25 NOTE — Telephone Encounter (Signed)
THIS REFILL REQUEST FOR REVLIMID WAS PLACED IN DR.SHADAD'S ACTIVE WORK FOLDER. 

## 2012-03-02 NOTE — Telephone Encounter (Signed)
RECEIVED A FAX FROM [facility] CONCERNING A CONFIRMATION OF PRESCRIPTION SHIPMENT FOR REVLIMID ON 03/01/12. 

## 2012-03-09 ENCOUNTER — Other Ambulatory Visit: Payer: Self-pay | Admitting: Internal Medicine

## 2012-03-16 DIAGNOSIS — M169 Osteoarthritis of hip, unspecified: Secondary | ICD-10-CM | POA: Diagnosis not present

## 2012-03-16 DIAGNOSIS — C9030 Solitary plasmacytoma not having achieved remission: Secondary | ICD-10-CM | POA: Diagnosis not present

## 2012-03-24 ENCOUNTER — Other Ambulatory Visit: Payer: Self-pay | Admitting: *Deleted

## 2012-03-24 DIAGNOSIS — C9000 Multiple myeloma not having achieved remission: Secondary | ICD-10-CM

## 2012-03-24 MED ORDER — LENALIDOMIDE 15 MG PO CAPS: ORAL_CAPSULE | ORAL | Status: DC

## 2012-03-24 NOTE — Telephone Encounter (Signed)
THIS REFILL REQUEST FOR REVLIMID WAS PLACED IN DR.SHADAD'S ACTIVE WORK FOLDER. 

## 2012-03-27 NOTE — Telephone Encounter (Signed)
RECEIVED A FAX FROM [facility] CONCERNING A CONFIRMATION OF FACSIMILE RECEIPT FOR PT.'S REFERRAL. 

## 2012-03-31 NOTE — Telephone Encounter (Signed)
RECEIVED A FAX FROM [facility] CONCERNING A CONFIRMATION OF PRESCRIPTION SHIPMENT FOR REVLIMID ON 03/30/12.

## 2012-04-12 ENCOUNTER — Encounter: Payer: Self-pay | Admitting: Oncology

## 2012-04-12 ENCOUNTER — Ambulatory Visit (HOSPITAL_BASED_OUTPATIENT_CLINIC_OR_DEPARTMENT_OTHER): Payer: Medicare Other | Admitting: Oncology

## 2012-04-12 ENCOUNTER — Other Ambulatory Visit (HOSPITAL_BASED_OUTPATIENT_CLINIC_OR_DEPARTMENT_OTHER): Payer: Medicare Other | Admitting: Lab

## 2012-04-12 ENCOUNTER — Telehealth: Payer: Self-pay | Admitting: Oncology

## 2012-04-12 VITALS — BP 165/69 | HR 59 | Temp 98.1°F | Resp 18 | Ht 65.0 in | Wt 121.9 lb

## 2012-04-12 DIAGNOSIS — C9000 Multiple myeloma not having achieved remission: Secondary | ICD-10-CM

## 2012-04-12 DIAGNOSIS — E876 Hypokalemia: Secondary | ICD-10-CM

## 2012-04-12 LAB — CBC WITH DIFFERENTIAL/PLATELET
BASO%: 0.8 % (ref 0.0–2.0)
EOS%: 2.6 % (ref 0.0–7.0)
HCT: 33.1 % — ABNORMAL LOW (ref 34.8–46.6)
LYMPH%: 20.3 % (ref 14.0–49.7)
MCH: 32.1 pg (ref 25.1–34.0)
MCHC: 33.9 g/dL (ref 31.5–36.0)
MCV: 94.6 fL (ref 79.5–101.0)
MONO%: 9.6 % (ref 0.0–14.0)
NEUT%: 66.7 % (ref 38.4–76.8)
Platelets: 163 10*3/uL (ref 145–400)

## 2012-04-12 LAB — COMPREHENSIVE METABOLIC PANEL (CC13)
AST: 18 U/L (ref 5–34)
Albumin: 3.2 g/dL — ABNORMAL LOW (ref 3.5–5.0)
Alkaline Phosphatase: 134 U/L (ref 40–150)
BUN: 12 mg/dL (ref 7.0–26.0)
Potassium: 3.2 mEq/L — ABNORMAL LOW (ref 3.5–5.1)
Sodium: 141 mEq/L (ref 136–145)

## 2012-04-12 NOTE — Telephone Encounter (Signed)
Gave pt appt for November 2013 lab and MD °

## 2012-04-13 NOTE — Progress Notes (Signed)
Hematology and Oncology Follow Up Visit  CHRISTALYNN BOISE [id number] 11-18-1929 78 y.o. 04/13/2012 3:51 PM  CC: Elby Showers, MD  Jesse Sans. Daleen Squibb, MD, [facility]  Radene Gunning, M.D., Ph.D.    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  She had a light chain multiple myeloma with M spike of about 0.35 gm/dL.   Prior Therapy: 1. Status post prophylactic fixation impending pathological fracture of the right femur done in August 2009. 2. External beam radiation concluded in 2010 at the right femur.  Current therapy:  She is on Revlimid 15 mg daily for the last 2 years for maintenance purposes.  Interim History:  Ms. Gimbel presents today for a followup visit. She is  an 78 year old female, as mentioned, who had initially presented with plasmacytoma and found to have evidence of light chain myeloma involving the bone marrow.  She currently has been on maintenance Revlimid on a continuous basis and was able to keep her disease under control. She has been having diarrhea on a daly basis. Uses Imodium most days of the week. She had not had any nausea, not had any abdominal distension.  She does report some pruritus at times, but really no rash or desquamation.  Reports that she has a numbness and prickly sensation to her hands and feet. For the most part, performance status and activity level continue to be at baseline.  She had not had any back pain.  Had not had any pathological fractures.  Had not had any falls at this time. Her appetite is still good.    Medications: I have reviewed the patient's current medications. Current outpatient prescriptions:alendronate (FOSAMAX) 35 MG tablet, take 1 tablet by mouth every week TAKE IN THE MORNING WITH A FULL GLASS OF WATER, ON A EMPTY STOMACH AND DONT TAKE ANYTHING ELSE BY MOUTH OR, Disp: 4 tablet, Rfl: 11;   ALPRAZolam (XANAX) 0.25 MG tablet, Take 1 tablet (0.25 mg total) by mouth 2 (two) times daily as needed for anxiety. Or throat tightness, Disp: 30 tablet, Rfl: 3 amLODipine (NORVASC) 5 MG tablet, Take 1 tablet (5 mg total) by mouth daily., Disp: 31 tablet, Rfl: 11;  aspirin 325 MG tablet, Take 1 tablet (325 mg total) by mouth 2 (two) times daily., Disp: 31 tablet, Rfl: 11;  Calcium Carbonate-Vitamin D (CALCIUM 600 + D PO), Take 1 tablet by mouth every morning. , Disp: , Rfl: ;  lenalidomide (REVLIMID) 15 MG capsule, TAKE ONE CAPSULE BY MOUTH DAILY, Disp: 28 capsule, Rfl: 0 lisinopril (PRINIVIL,ZESTRIL) 40 MG tablet, take 1 tablet by mouth once daily, Disp: 31 tablet, Rfl: 11;  Loperamide-Simethicone (IMODIUM ADVANCED) 2-125 MG TABS, Take 1 tablet 2 - 3 times per day as needed for diarrhea , Disp: , Rfl:  nitroGLYCERIN (NITROSTAT) 0.4 MG SL tablet, Place 1 tablet (0.4 mg total) under the tongue every 5 (five) minutes as needed for chest pain. Place 1 tablet under tongue for chest pain;  for every  5 minutes for a maximum of 3 times. Call doctor if pain persists., Disp: 90 tablet, Rfl: 11;  omeprazole (PRILOSEC) 20 MG capsule, take 1 capsule by mouth twice a day, Disp: 62 capsule, Rfl: 11 potassium chloride SA (KLOR-CON M20) 20 MEQ tablet, Take 1 tablet (20 mEq total) by mouth daily., Disp: 30 tablet, Rfl: 1;  sertraline (ZOLOFT) 25 MG tablet, Take 1 tablet (25  mg total) by mouth daily., Disp: 31 tablet, Rfl: 11;  simvastatin (ZOCOR) 20 MG tablet, Take 1 tablet (20 mg total) by mouth at bedtime., Disp: 31 tablet, Rfl: 11  Allergies: No Known Allergies  Past Medical History, Surgical history, Social history, and Family History were reviewed and updated.  Review of Systems: Constitutional:  Negative for fever, chills, night sweats, anorexia, weight loss, pain. Cardiovascular: no chest pain or dyspnea on exertion Respiratory: no cough, shortness of breath, or wheezing Neurological: no TIA or stroke  symptoms Dermatological: negative ENT: negative Skin: Negative. Gastrointestinal: no abdominal pain, change in bowel habits, or black or bloody stools Genito-Urinary: no dysuria, trouble voiding, or hematuria Hematological and Lymphatic: negative Breast: negative Musculoskeletal: negative Remaining ROS negative.  Physical Exam: Blood pressure 165/69, pulse 59, temperature 98.1 F (36.7 C), temperature source Oral, resp. rate 18, height 5\' 5"  (1.651 m), weight 121 lb 14.4 oz (55.293 kg). ECOG: 1 General appearance: alert Head: Normocephalic, without obvious abnormality, atraumatic Neck: no adenopathy, no carotid bruit, no JVD, supple, symmetrical, trachea midline and thyroid not enlarged, symmetric, no tenderness/mass/nodules Lymph nodes: Cervical, supraclavicular, and axillary nodes normal. Heart:regular rate and rhythm, S1, S2 normal, no murmur, click, rub or gallop Lung:chest clear, no wheezing, rales, normal symmetric air entry Abdomen: soft, non-tender, without masses or organomegaly EXT:no evidence of joint effusion   Lab Results: Lab Results  Component Value Date   WBC 3.7* 04/12/2012   HGB 11.2* 04/12/2012   HCT 33.1* 04/12/2012   MCV 94.6 04/12/2012   PLT 163 04/12/2012    Impression and Plan:  A 78 year old female with the following issues. 1. History of a plasmacytoma of the right femur.  She is status post surgical fixation followed by external beam radiation, doing very well at this time.  No evidence of any changes. 2. 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  She is currently on Revlimid.  Her kappa free light chain under reasonable control. Skeletal survey reviewed and show no new lesions. SHe is having more diarrhea and neuropathy. Will hold Revlimid for 1 month then reassess.  3. Hypokalemia.  She is on potassium supplementation. Her electrolytes will be rechecked today. 4. Bony disease.  Continue to  be on calcium and vitamin D supplements.  I will hold off on bisphosphonate for the time being. 5. Follow up in 1 month.     CURCIO, KRISTIN 10/24/20133:51 PM

## 2012-04-14 LAB — KAPPA/LAMBDA LIGHT CHAINS
Kappa free light chain: 6.62 mg/dL — ABNORMAL HIGH (ref 0.33–1.94)
Lambda Free Lght Chn: 1.92 mg/dL (ref 0.57–2.63)

## 2012-04-14 LAB — SPEP & IFE WITH QIG
Alpha-1-Globulin: 5.3 % — ABNORMAL HIGH (ref 2.9–4.9)
Alpha-2-Globulin: 10.9 % (ref 7.1–11.8)
Total Protein, Serum Electrophoresis: 6.1 g/dL (ref 6.0–8.3)

## 2012-04-24 ENCOUNTER — Other Ambulatory Visit: Payer: Self-pay | Admitting: *Deleted

## 2012-04-24 NOTE — Telephone Encounter (Signed)
THIS REFILL REQUEST FOR REVLIMID WAS PLACED IN DR.SHADAD'S ACTIVE WORK FOLDER. 

## 2012-04-25 NOTE — Telephone Encounter (Signed)
RECEIVED A FAX FROM [facility] CONCERNING A CONFIRMATION OF FACSIMILE RECEIPT FOR PT.'S REFERRAL. 

## 2012-05-12 ENCOUNTER — Ambulatory Visit: Payer: Medicare Other | Admitting: Oncology

## 2012-05-12 ENCOUNTER — Other Ambulatory Visit: Payer: Medicare Other

## 2012-05-12 ENCOUNTER — Telehealth: Payer: Self-pay | Admitting: Oncology

## 2012-05-12 ENCOUNTER — Telehealth: Payer: Self-pay | Admitting: *Deleted

## 2012-05-12 NOTE — Telephone Encounter (Signed)
Pt called to r/s today's appts.  States unable to make it today.  Dr. Clelia Croft notified and POF sent to scheduling.  Instructed pt to expect call from Scheduler next week.   She verbalized understanding.

## 2012-05-12 NOTE — Telephone Encounter (Signed)
s.w. pt and gv appt t/d 11.26.13 for lab and est..Marland KitchenMarland KitchenMarland Kitchenpt aware

## 2012-05-16 ENCOUNTER — Telehealth: Payer: Self-pay | Admitting: *Deleted

## 2012-05-16 ENCOUNTER — Other Ambulatory Visit (HOSPITAL_BASED_OUTPATIENT_CLINIC_OR_DEPARTMENT_OTHER): Payer: Medicare Other | Admitting: Lab

## 2012-05-16 ENCOUNTER — Ambulatory Visit (HOSPITAL_BASED_OUTPATIENT_CLINIC_OR_DEPARTMENT_OTHER): Payer: Medicare Other | Admitting: Oncology

## 2012-05-16 ENCOUNTER — Telehealth: Payer: Self-pay | Admitting: Oncology

## 2012-05-16 VITALS — BP 151/70 | HR 68 | Temp 97.4°F | Resp 18 | Ht 65.0 in | Wt 121.6 lb

## 2012-05-16 DIAGNOSIS — E876 Hypokalemia: Secondary | ICD-10-CM

## 2012-05-16 DIAGNOSIS — C9000 Multiple myeloma not having achieved remission: Secondary | ICD-10-CM

## 2012-05-16 LAB — COMPREHENSIVE METABOLIC PANEL (CC13)
AST: 21 U/L (ref 5–34)
Alkaline Phosphatase: 122 U/L (ref 40–150)
BUN: 18 mg/dL (ref 7.0–26.0)
Creatinine: 0.9 mg/dL (ref 0.6–1.1)
Total Bilirubin: 0.76 mg/dL (ref 0.20–1.20)

## 2012-05-16 LAB — CBC WITH DIFFERENTIAL/PLATELET
Basophils Absolute: 0 10*3/uL (ref 0.0–0.1)
EOS%: 0.4 % (ref 0.0–7.0)
HCT: 34 % — ABNORMAL LOW (ref 34.8–46.6)
HGB: 11.2 g/dL — ABNORMAL LOW (ref 11.6–15.9)
LYMPH%: 21.9 % (ref 14.0–49.7)
MCH: 31.6 pg (ref 25.1–34.0)
MCV: 96.1 fL (ref 79.5–101.0)
MONO%: 6.7 % (ref 0.0–14.0)
NEUT%: 70.8 % (ref 38.4–76.8)
RDW: 14.5 % (ref 11.2–14.5)

## 2012-05-16 NOTE — Progress Notes (Signed)
Hematology and Oncology Follow Up Visit  LUVERN MISCHKE [id number] January 18, 1930 78 y.o. 05/16/2012 4:04 PM  CC: Elby Showers, MD  Jesse Sans. Daleen Squibb, MD, [facility]  Radene Gunning, M.D., Ph.D.    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  She had a light chain multiple myeloma with M spike of about 0.35 gm/dL.  Prior Therapy: 1. Status post prophylactic fixation impending pathological fracture of the right femur done in August 2009. 2. External beam radiation concluded in 2010 at the right femur.  Current therapy:  She is on Revlimid 15 mg daily for the last 2 years for maintenance purposes. This was stopped in 03/2012.   Interim History:  Ms. Leflore presents today for a followup visit. She is  an 78 year old female, as mentioned, who had initially presented with plasmacytoma and found to have evidence of light chain myeloma involving the bone marrow. She has been on maintenance Revlimid on a continuous basis and was able to keep her disease under control. She has been having diarrhea on a daly basis and improved in the last few weeks. She had not had any nausea, not had any abdominal distension.  She does report some pruritus at times, but really no rash or desquamation.  Reports that she has a numbness and prickly sensation to her hands and feet. For the most part, performance status and activity level continue to be at baseline.  She had not had any back pain.  Had not had any pathological fractures.  Had not had any falls at this time. Her appetite is still good.    Medications: I have reviewed the patient's current medications. Current outpatient prescriptions:alendronate (FOSAMAX) 35 MG tablet, take 1 tablet by mouth every week TAKE IN THE MORNING WITH A FULL GLASS OF WATER, ON A EMPTY STOMACH AND DONT TAKE ANYTHING ELSE BY MOUTH OR, Disp: 4 tablet,  Rfl: 11;  ALPRAZolam (XANAX) 0.25 MG tablet, Take 1 tablet (0.25 mg total) by mouth 2 (two) times daily as needed for anxiety. Or throat tightness, Disp: 30 tablet, Rfl: 3 amLODipine (NORVASC) 5 MG tablet, Take 1 tablet (5 mg total) by mouth daily., Disp: 31 tablet, Rfl: 11;  aspirin 325 MG tablet, Take 1 tablet (325 mg total) by mouth 2 (two) times daily., Disp: 31 tablet, Rfl: 11;  Calcium Carbonate-Vitamin D (CALCIUM 600 + D PO), Take 1 tablet by mouth every morning. , Disp: , Rfl: ;  lenalidomide (REVLIMID) 15 MG capsule, TAKE ONE CAPSULE BY MOUTH DAILY, Disp: 28 capsule, Rfl: 0 lisinopril (PRINIVIL,ZESTRIL) 40 MG tablet, take 1 tablet by mouth once daily, Disp: 31 tablet, Rfl: 11;  Loperamide-Simethicone (IMODIUM ADVANCED) 2-125 MG TABS, Take 1 tablet 2 - 3 times per day as needed for diarrhea , Disp: , Rfl:  nitroGLYCERIN (NITROSTAT) 0.4 MG SL tablet, Place 1 tablet (0.4 mg total) under the tongue every 5 (five) minutes as needed for chest pain. Place 1 tablet under tongue for chest pain;  for every  5 minutes for a maximum of 3 times. Call doctor if pain persists., Disp: 90 tablet, Rfl: 11;  omeprazole (PRILOSEC) 20 MG capsule, take 1 capsule by mouth twice a day, Disp: 62 capsule, Rfl: 11 potassium chloride SA (KLOR-CON M20) 20 MEQ tablet, Take 1 tablet (20 mEq total) by mouth daily., Disp: 30 tablet, Rfl: 1;  sertraline (ZOLOFT) 25 MG tablet, Take  1 tablet (25 mg total) by mouth daily., Disp: 31 tablet, Rfl: 11;  simvastatin (ZOCOR) 20 MG tablet, Take 1 tablet (20 mg total) by mouth at bedtime., Disp: 31 tablet, Rfl: 11  Allergies: No Known Allergies  Past Medical History, Surgical history, Social history, and Family History were reviewed and updated.  Review of Systems: Constitutional:  Negative for fever, chills, night sweats, anorexia, weight loss, pain. Cardiovascular: no chest pain or dyspnea on exertion Respiratory: no cough, shortness of breath, or wheezing Neurological: no TIA or  stroke symptoms Dermatological: negative ENT: negative Skin: Negative. Gastrointestinal: no abdominal pain, change in bowel habits, or black or bloody stools Genito-Urinary: no dysuria, trouble voiding, or hematuria Hematological and Lymphatic: negative Breast: negative Musculoskeletal: negative Remaining ROS negative.  Physical Exam: Blood pressure 151/70, pulse 68, temperature 97.4 F (36.3 C), temperature source Oral, resp. rate 18, height 5\' 5"  (1.651 m), weight 121 lb 9.6 oz (55.157 kg). ECOG: 1 General appearance: alert Head: Normocephalic, without obvious abnormality, atraumatic Neck: no adenopathy, no carotid bruit, no JVD, supple, symmetrical, trachea midline and thyroid not enlarged, symmetric, no tenderness/mass/nodules Lymph nodes: Cervical, supraclavicular, and axillary nodes normal. Heart:regular rate and rhythm, S1, S2 normal, no murmur, click, rub or gallop Lung:chest clear, no wheezing, rales, normal symmetric air entry Abdomen: soft, non-tender, without masses or organomegaly EXT:no evidence of joint effusion   Lab Results: Lab Results  Component Value Date   WBC 5.9 05/16/2012   HGB 11.2* 05/16/2012   HCT 34.0* 05/16/2012   MCV 96.1 05/16/2012   PLT 166 05/16/2012    Impression and Plan:  A 78 year old female with the following issues. 1. History of a plasmacytoma of the right femur.  She is status post surgical fixation followed by external beam radiation, doing very well at this time.  No evidence of any changes. 2. 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  She is off Revlimid for the last month. Her kappa free light chain under reasonable control. Skeletal survey reviewed and show no new lesions. Her symptoms are better now. We plan to keep her Revlimid and restart it with porgression 3. Hypokalemia.  She is on potassium supplementation. Her electrolytes will be rechecked today. 4. Bony disease.   Continue to be on calcium and vitamin D supplements.  I will hold off on bisphosphonate for the time being. 5. Follow up in 2 month.     [facility] 11/26/20134:04 PM

## 2012-05-16 NOTE — Telephone Encounter (Signed)
appts made and printed for pt  °

## 2012-05-16 NOTE — Telephone Encounter (Signed)
1. I cannot find a record of my ever seeing this patient! 2. No notes from a previous neurologist - for what was he being followed 3. If he doesn't have a movement disorder we do not have a neurologist for him at [facility] (Dr. Arbutus Leas sees movement disorder patients only). 4. If I am to refer him I need to see him first.

## 2012-05-16 NOTE — Telephone Encounter (Signed)
Pt called stating his neurologist is moving out of state and he would like a Dr Debby Bud to do a referral for him a neurologist in the [place] system who is veteran friendly. Please advise.

## 2012-05-16 NOTE — Progress Notes (Signed)
Hold Revlimid @ this time, per Dr. Clelia Croft.

## 2012-05-19 LAB — PROTEIN ELECTROPHORESIS, SERUM
Alpha-1-Globulin: 5 % — ABNORMAL HIGH (ref 2.9–4.9)
Alpha-2-Globulin: 11 % (ref 7.1–11.8)
Gamma Globulin: 16.8 % (ref 11.1–18.8)

## 2012-05-19 LAB — KAPPA/LAMBDA LIGHT CHAINS: Kappa free light chain: 14 mg/dL — ABNORMAL HIGH (ref 0.33–1.94)

## 2012-05-31 ENCOUNTER — Other Ambulatory Visit: Payer: Self-pay | Admitting: Internal Medicine

## 2012-06-07 NOTE — Assessment & Plan Note (Signed)
Lab Results  Component Value Date   CHOL 127 02/25/2012   CHOL 122 09/03/2009   CHOL 154 07/09/2008   Lab Results  Component Value Date   HDL 60 02/25/2012   HDL 54 09/03/2009   HDL 67 4/54/0981   Lab Results  Component Value Date   LDLCALC 50 02/25/2012   LDLCALC 55 09/03/2009   LDLCALC 70 07/09/2008   Lab Results  Component Value Date   TRIG 83 02/25/2012   TRIG 66 09/03/2009   TRIG 87 07/09/2008   Lab Results  Component Value Date   CHOLHDL 2.1 02/25/2012   CHOLHDL 2.3 Ratio 09/03/2009   CHOLHDL 2.3 Ratio 07/09/2008   No results found for this basename: LDLDIRECT   His LDL is well controlled on his current medications.  We will recheck in yearly for now.

## 2012-06-07 NOTE — Assessment & Plan Note (Signed)
BP Readings from Last 6 Encounters:  02/25/12 134/75  01/11/12 130/72  10/05/11 137/76  07/06/11 148/64   His blood pressure is well controlled on his current regimen.  He will continue his medications as prescribed.

## 2012-07-11 ENCOUNTER — Other Ambulatory Visit (HOSPITAL_BASED_OUTPATIENT_CLINIC_OR_DEPARTMENT_OTHER): Payer: Medicare Other

## 2012-07-11 ENCOUNTER — Encounter: Payer: Medicare Other | Admitting: Oncology

## 2012-07-11 DIAGNOSIS — C9000 Multiple myeloma not having achieved remission: Secondary | ICD-10-CM | POA: Diagnosis not present

## 2012-07-11 DIAGNOSIS — D472 Monoclonal gammopathy: Secondary | ICD-10-CM | POA: Diagnosis not present

## 2012-07-11 LAB — COMPREHENSIVE METABOLIC PANEL (CC13)
Albumin: 3 g/dL — ABNORMAL LOW (ref 3.5–5.0)
CO2: 26 mEq/L (ref 22–29)
Calcium: 8.8 mg/dL (ref 8.4–10.4)
Chloride: 107 mEq/L (ref 98–107)
Glucose: 90 mg/dl (ref 70–99)
Potassium: 3.2 mEq/L — ABNORMAL LOW (ref 3.5–5.1)
Sodium: 142 mEq/L (ref 136–145)
Total Protein: 6.8 g/dL (ref 6.4–8.3)

## 2012-07-11 LAB — CBC WITH DIFFERENTIAL/PLATELET
Eosinophils Absolute: 0 10*3/uL (ref 0.0–0.5)
LYMPH%: 20.2 % (ref 14.0–49.7)
MONO#: 0.4 10*3/uL (ref 0.1–0.9)
NEUT#: 4 10*3/uL (ref 1.5–6.5)
Platelets: 261 10*3/uL (ref 145–400)
RBC: 3.3 10*6/uL — ABNORMAL LOW (ref 3.70–5.45)
RDW: 13.3 % (ref 11.2–14.5)
WBC: 5.5 10*3/uL (ref 3.9–10.3)

## 2012-07-11 NOTE — Progress Notes (Signed)
This encounter was created in error - please disregard.

## 2012-07-13 LAB — SPEP & IFE WITH QIG
Beta 2: 6.1 % (ref 3.2–6.5)
Beta Globulin: 5.6 % (ref 4.7–7.2)
IgA: 227 mg/dL (ref 69–380)
IgG (Immunoglobin G), Serum: 1010 mg/dL (ref 690–1700)

## 2012-07-17 ENCOUNTER — Telehealth: Payer: Self-pay | Admitting: Oncology

## 2012-07-17 ENCOUNTER — Ambulatory Visit (HOSPITAL_BASED_OUTPATIENT_CLINIC_OR_DEPARTMENT_OTHER): Payer: Medicare Other | Admitting: Oncology

## 2012-07-17 ENCOUNTER — Encounter: Payer: Self-pay | Admitting: Oncology

## 2012-07-17 VITALS — BP 168/75 | HR 66 | Temp 98.0°F | Resp 18 | Ht 65.0 in | Wt 127.0 lb

## 2012-07-17 DIAGNOSIS — E876 Hypokalemia: Secondary | ICD-10-CM

## 2012-07-17 DIAGNOSIS — C9000 Multiple myeloma not having achieved remission: Secondary | ICD-10-CM | POA: Diagnosis not present

## 2012-07-17 MED ORDER — POTASSIUM CHLORIDE CRYS ER 20 MEQ PO TBCR: 20.0000 meq | EXTENDED_RELEASE_TABLET | Freq: Every day | ORAL | Status: DC

## 2012-07-17 NOTE — Telephone Encounter (Signed)
Gave pt appt for lab and MD for March 2014 °

## 2012-07-17 NOTE — Progress Notes (Signed)
Hematology and Oncology Follow Up Visit  Kayla Brooks [id number] 02/21/1930 79 y.o. 07/17/2012 8:16 PM  CC: Kayla Showers, MD  Kayla Sans. Daleen Squibb, MD, [facility]  Radene Gunning, M.D., Ph.D.    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  She had a light chain multiple myeloma with M spike of about 0.35 gm/dL.  Prior Therapy: 1. Status post prophylactic fixation impending pathological fracture of the right femur done in August 2009. 2. External beam radiation concluded in 2010 at the right femur. 3. Revlimid 25 mg daily as maintenance therapy for 2 years ending 03/2012  Current therapy:  Watchful observation.  Interim History:  [name] presents today for a followup visit. She is  an 79 year old female, as mentioned, who had initially presented with plasmacytoma and found to have evidence of light chain myeloma involving the bone marrow. She received Revlimid with reasonable control of her disease. This was stopped in October 2013 due to diarrhea. She continues to have diarrhea, although this is less frequent since stopping Revlimid. She had not had any nausea, not had any abdominal distension.  She does report some pruritus at times, but really no rash or desquamation.  Reports that she has a numbness and prickly sensation to her hands and feet. For the most part, performance status and activity level continue to be at baseline.  She had not had any back pain.  Had not had any pathological fractures.  Had not had any falls at this time. Her appetite is still good and she is gaining back her lost weight.    Medications: I have reviewed the patient's current medications. Current outpatient prescriptions:alendronate (FOSAMAX) 35 MG tablet, take 1 tablet by mouth every week TAKE IN THE MORNING WITH A FULL GLASS OF WATER, ON A EMPTY STOMACH AND DONT TAKE  ANYTHING ELSE BY MOUTH OR, Disp: 4 tablet, Rfl: 11;  ALPRAZolam (XANAX) 0.25 MG tablet, Take 1 tablet (0.25 mg total) by mouth 2 (two) times daily as needed for anxiety. Or throat tightness, Disp: 30 tablet, Rfl: 3 amLODipine (NORVASC) 5 MG tablet, Take 1 tablet (5 mg total) by mouth daily., Disp: 31 tablet, Rfl: 11;  aspirin 325 MG tablet, Take 1 tablet (325 mg total) by mouth 2 (two) times daily., Disp: 31 tablet, Rfl: 11;  Calcium Carbonate-Vitamin D (CALCIUM 600 + D PO), Take 1 tablet by mouth every morning. , Disp: , Rfl: ;  lisinopril (PRINIVIL,ZESTRIL) 40 MG tablet, take 1 tablet by mouth once daily, Disp: 31 tablet, Rfl: 11 Loperamide-Simethicone (IMODIUM ADVANCED) 2-125 MG TABS, Take 1 tablet 2 - 3 times per day as needed for diarrhea , Disp: , Rfl: ;  nitroGLYCERIN (NITROSTAT) 0.4 MG SL tablet, Place 1 tablet (0.4 mg total) under the tongue every 5 (five) minutes as needed for chest pain. Place 1 tablet under tongue for chest pain;  for every  5 minutes for a maximum of 3 times. Call doctor if pain persists., Disp: 90 tablet, Rfl: 11 omeprazole (PRILOSEC) 20 MG capsule, take 1 capsule by mouth twice a day, Disp: 62 capsule, Rfl: 11;  potassium chloride SA (K-DUR,KLOR-CON) 20 MEQ tablet, Take 1 tablet (20 mEq total) by mouth daily. Take 1 tablet daily for 7 days, Disp: 7 tablet, Rfl: 0;  sertraline (ZOLOFT) 25 MG tablet, Take 1 tablet (25 mg total) by mouth daily., Disp: 30 tablet, Rfl:  3 simvastatin (ZOCOR) 20 MG tablet, Take 1 tablet (20 mg total) by mouth at bedtime., Disp: 31 tablet, Rfl: 11  Allergies: No Known Allergies  Past Medical History, Surgical history, Social history, and Family History were reviewed and updated.  Review of Systems: Constitutional:  Negative for fever, chills, night sweats, anorexia, weight loss, pain. Cardiovascular: no chest pain or dyspnea on exertion Respiratory: no cough, shortness of breath, or wheezing Neurological: no TIA or stroke  symptoms Dermatological: negative ENT: negative Skin: Negative. Gastrointestinal: no abdominal pain, change in bowel habits, or black or bloody stools Genito-Urinary: no dysuria, trouble voiding, or hematuria Hematological and Lymphatic: negative Breast: negative Musculoskeletal: negative Remaining ROS negative.  Physical Exam: Blood pressure 168/75, pulse 66, temperature 98 F (36.7 C), temperature source Oral, resp. rate 18, height 5\' 5"  (1.651 m), weight 127 lb (57.607 kg). ECOG: 1 General appearance: alert Head: Normocephalic, without obvious abnormality, atraumatic Neck: no adenopathy, no carotid bruit, no JVD, supple, symmetrical, trachea midline and thyroid not enlarged, symmetric, no tenderness/mass/nodules Lymph nodes: Cervical, supraclavicular, and axillary nodes normal. Heart:regular rate and rhythm, S1, S2 normal, no murmur, click, rub or gallop Lung:chest clear, no wheezing, rales, normal symmetric air entry Abdomen: soft, non-tender, without masses or organomegaly EXT:no evidence of joint effusion   Lab Results: Lab Results  Component Value Date   WBC 5.5 07/11/2012   HGB 10.5* 07/11/2012   HCT 31.4* 07/11/2012   MCV 95.2 07/11/2012   PLT 261 07/11/2012    Impression and Plan:  A 79 year old female with the following issues. 1. History of a plasmacytoma of the right femur.  She is status post surgical fixation followed by external beam radiation, doing very well at this time.  No evidence of any changes. 2. 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  She has been off Revlimid for about 3 months. Her kappa free light chain is under reasonable control. Skeletal survey reviewed and show no new lesions. Her symptoms are better now. We plan to keep her Revlimid and restart it with porgression 3. Hypokalemia.  Patient is unsure if she is taking K+ supplementation. I have prescribed KDur 20 MEq daily for 7 days.  Recommend follow-up with PCP for further management. 4. Bony disease.  Continue to be on calcium and vitamin D supplements.  I will hold off on bisphosphonate for the time being. 5. Follow up in 2 months.     [place], [place] 1/27/20148:16 PM

## 2012-07-17 NOTE — Patient Instructions (Addendum)
Take Potassium tablet once a day for 7 days only, then stop.  No Revlimid for now. We will see you back in about 2 months to see how you are doing.

## 2012-09-08 ENCOUNTER — Ambulatory Visit (INDEPENDENT_AMBULATORY_CARE_PROVIDER_SITE_OTHER): Payer: Medicare Other | Admitting: Internal Medicine

## 2012-09-08 ENCOUNTER — Encounter: Payer: Self-pay | Admitting: Internal Medicine

## 2012-09-08 VITALS — BP 142/74 | HR 62 | Temp 99.2°F | Wt 127.2 lb

## 2012-09-08 DIAGNOSIS — I10 Essential (primary) hypertension: Secondary | ICD-10-CM

## 2012-09-08 DIAGNOSIS — F329 Major depressive disorder, single episode, unspecified: Secondary | ICD-10-CM

## 2012-09-08 DIAGNOSIS — M2011 Hallux valgus (acquired), right foot: Secondary | ICD-10-CM

## 2012-09-08 DIAGNOSIS — Z23 Encounter for immunization: Secondary | ICD-10-CM | POA: Diagnosis not present

## 2012-09-08 DIAGNOSIS — M2010 Hallux valgus (acquired), unspecified foot: Secondary | ICD-10-CM

## 2012-09-08 DIAGNOSIS — M21619 Bunion of unspecified foot: Secondary | ICD-10-CM

## 2012-09-08 MED ORDER — HYDROCHLOROTHIAZIDE 12.5 MG PO CAPS: 12.5000 mg | ORAL_CAPSULE | Freq: Every day | ORAL | Status: DC

## 2012-09-08 MED ORDER — LISINOPRIL 40 MG PO TABS: 40.0000 mg | ORAL_TABLET | Freq: Every day | ORAL | Status: DC

## 2012-09-08 MED ORDER — ASPIRIN 81 MG PO TABS: 81.0000 mg | ORAL_TABLET | Freq: Every day | ORAL | Status: DC

## 2012-09-08 MED ORDER — SERTRALINE HCL 25 MG PO TABS: 25.0000 mg | ORAL_TABLET | Freq: Every day | ORAL | Status: DC

## 2012-09-08 NOTE — Progress Notes (Signed)
Subjective:   Patient ID: Kayla Brooks female   DOB: June 01, 1930 79 y.o.   MRN: [id number]  HPI: [name] is a 79 y.o. woman who presents to the clinic today for follow up on her chronic medical conditions including hyeprtension and depression. See Problem focused Assessment and Plan for full details of her chronic medical conditions.   She states that she has had pain in her right foot.  She states that the pain is between the 1st and 2nd toe on the right.  It hurts more when she walks and especially if she wears close toed shoes.  She denies trauma to the foot and states that this has been building over the last few years.    Past Medical History  Diagnosis Date  . Multiple myeloma without mention of remission   . Monoclonal paraproteinemia    Current Outpatient Prescriptions  Medication Sig Dispense Refill  . alendronate (FOSAMAX) 35 MG tablet take 1 tablet by mouth every week TAKE IN THE MORNING WITH A FULL GLASS OF WATER, ON A EMPTY STOMACH AND DONT TAKE ANYTHING ELSE BY MOUTH OR  4 tablet  11  . ALPRAZolam (XANAX) 0.25 MG tablet Take 1 tablet (0.25 mg total) by mouth 2 (two) times daily as needed for anxiety. Or throat tightness  30 tablet  3  . amLODipine (NORVASC) 5 MG tablet Take 1 tablet (5 mg total) by mouth daily.  31 tablet  11  . aspirin 325 MG tablet Take 1 tablet (325 mg total) by mouth 2 (two) times daily.  31 tablet  11  . Calcium Carbonate-Vitamin D (CALCIUM 600 + D PO) Take 1 tablet by mouth every morning.       Marland Kitchen lisinopril (PRINIVIL,ZESTRIL) 40 MG tablet take 1 tablet by mouth once daily  31 tablet  11  . Loperamide-Simethicone (IMODIUM ADVANCED) 2-125 MG TABS Take 1 tablet 2 - 3 times per day as needed for diarrhea       . nitroGLYCERIN (NITROSTAT) 0.4 MG SL tablet Place 1 tablet (0.4 mg total) under the tongue every 5 (five) minutes as needed for chest pain. Place 1 tablet under tongue for chest pain;  for every  5 minutes for a maximum of 3 times. Call  doctor if pain persists.  90 tablet  11  . omeprazole (PRILOSEC) 20 MG capsule take 1 capsule by mouth twice a day  62 capsule  11  . potassium chloride SA (K-DUR,KLOR-CON) 20 MEQ tablet Take 1 tablet (20 mEq total) by mouth daily. Take 1 tablet daily for 7 days  7 tablet  0  . sertraline (ZOLOFT) 25 MG tablet Take 1 tablet (25 mg total) by mouth daily.  30 tablet  3  . simvastatin (ZOCOR) 20 MG tablet Take 1 tablet (20 mg total) by mouth at bedtime.  31 tablet  11   No current facility-administered medications for this visit.   No family history on file. History   Social History  . Marital Status: Widowed    Spouse Name: N/A    Number of Children: N/A  . Years of Education: N/A   Social History Main Topics  . Smoking status: Former Games developer  . Smokeless tobacco: None  . Alcohol Use: No  . Drug Use: No  . Sexually Active: None   Other Topics Concern  . None   Social History Narrative  . None   Review of Systems: A full 12 system ROS is negative except as noted in the  HPI and A&P.   Objective:  Physical Exam: Filed Vitals:   09/08/12 1557  BP: 142/74  Pulse: 62  Temp: 99.2 F (37.3 C)  TempSrc: Oral  Weight: 127 lb 3.2 oz (57.698 kg)  SpO2: 100%   Constitutional: Vital signs reviewed.  Patient is a well-developed and well-nourished woman in no acute distress and cooperative with exam. Alert and oriented x3.  Head: Normocephalic and atraumatic Ear: TM normal bilaterally Mouth: no erythema or exudates, MMM Eyes: PERRL, EOMI, conjunctivae normal, No scleral icterus.  Neck: Supple, Trachea midline normal ROM, No JVD, mass, thyromegaly, or carotid bruit present.  Cardiovascular: RRR, S1 normal, S2 normal, no MRG, pulses symmetric and intact bilaterally Pulmonary/Chest: CTAB, no wheezes, rales, or rhonchi Abdominal: Soft. Non-tender, non-distended, bowel sounds are normal, no masses, organomegaly, or guarding present.  GU: no CVA tenderness Musculoskeletal: there is  bilateral hallux valgus deformity and bunions bilaterally.  There is point tenderness to the right foot between the 1st and 2nd MTP.  There is a corn between the toes with the pain right over the corn.  No joint deformities, erythema, or stiffness, ROM full and no nontender Hematology: no cervical, inginal, or axillary adenopathy.  Neurological: A&O x3, Strength is normal and symmetric bilaterally, cranial nerve II-XII are grossly intact, no focal motor deficit, sensory intact to light touch bilaterally.  Skin: Warm, dry and intact. No rash, cyanosis, or clubbing.  Psychiatric: depressed mood and flat affect. speech and behavior is normal. Judgment and thought content normal. Cognition and memory are normal.   Assessment & Plan:

## 2012-09-08 NOTE — Patient Instructions (Signed)
1.  Start HCTZ 12.5 mg tablets.  Take 1 tablet daily for your blood pressure.  2.  Keep the spot on your big toe padded to prevent the corn from hurting.  We will work to get you over to the foot doctor.  3.  Continue your other medications as prescribed.  4.  Follow up in 2 weeks to recheck your blood pressure.

## 2012-09-12 ENCOUNTER — Telehealth: Payer: Self-pay | Admitting: Licensed Clinical Social Worker

## 2012-09-12 NOTE — Telephone Encounter (Signed)
CSW returned voice mail from Ms. [name].  Pt states she has paperwork from [facility] and has questions regarding which policy to enroll.  Ms. [name] concern and in need of advice for choosing a Part D plan.  Pt states she did not want to make a toll-free call and not have enough time to resolve all concerns.  CSW referred Ms. [name] to [employer] [facility] as they have a Geophysical data processor member to discuss and help with [employer].  CSW provided pt with contact information.  Pt denies add'l needs at this time.  CSW will sign off.

## 2012-09-14 ENCOUNTER — Telehealth: Payer: Self-pay | Admitting: Oncology

## 2012-09-14 ENCOUNTER — Ambulatory Visit (HOSPITAL_BASED_OUTPATIENT_CLINIC_OR_DEPARTMENT_OTHER): Payer: Medicare Other | Admitting: Oncology

## 2012-09-14 ENCOUNTER — Other Ambulatory Visit (HOSPITAL_BASED_OUTPATIENT_CLINIC_OR_DEPARTMENT_OTHER): Payer: Medicare Other

## 2012-09-14 VITALS — BP 157/80 | HR 54 | Temp 97.2°F | Resp 20 | Wt 124.2 lb

## 2012-09-14 DIAGNOSIS — C9000 Multiple myeloma not having achieved remission: Secondary | ICD-10-CM

## 2012-09-14 LAB — CBC WITH DIFFERENTIAL/PLATELET
Basophils Absolute: 0 10*3/uL (ref 0.0–0.1)
Eosinophils Absolute: 0 10*3/uL (ref 0.0–0.5)
HCT: 37 % (ref 34.8–46.6)
HGB: 12.2 g/dL (ref 11.6–15.9)
LYMPH%: 17.9 % (ref 14.0–49.7)
MCH: 32.1 pg (ref 25.1–34.0)
MCV: 97.4 fL (ref 79.5–101.0)
MONO%: 5.8 % (ref 0.0–14.0)
NEUT#: 4.4 10*3/uL (ref 1.5–6.5)
NEUT%: 76 % (ref 38.4–76.8)
Platelets: 216 10*3/uL (ref 145–400)
RDW: 12.2 % (ref 11.2–14.5)

## 2012-09-14 LAB — COMPREHENSIVE METABOLIC PANEL (CC13)
Albumin: 3.7 g/dL (ref 3.5–5.0)
BUN: 20.7 mg/dL (ref 7.0–26.0)
CO2: 28 mEq/L (ref 22–29)
Glucose: 98 mg/dl (ref 70–99)
Sodium: 141 mEq/L (ref 136–145)
Total Bilirubin: 0.63 mg/dL (ref 0.20–1.20)
Total Protein: 7.3 g/dL (ref 6.4–8.3)

## 2012-09-14 NOTE — Telephone Encounter (Signed)
Gave pt appt for lab,ML and MD on May and July2014

## 2012-09-14 NOTE — Progress Notes (Signed)
Hematology and Oncology Follow Up Visit  Kayla Brooks [id number] 04/06/1930 79 y.o. 09/14/2012 3:29 PM  CC: Kayla Showers, MD  Kayla Brooks. Kayla Squibb, MD, [facility]  Kayla Brooks, M.D., Ph.D.    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  She had a light chain multiple myeloma with M spike of about 0.35 gm/dL.  Prior Therapy: 1. Status post prophylactic fixation impending pathological fracture of the right femur done in August 2009. 2. External beam radiation concluded in 2010 at the right femur. 3. Revlimid 25 mg daily as maintenance therapy for 2 years ending 03/2012  Current therapy:  Watchful observation.  Interim History:  [name] presents today for a followup visit. She is  an 79 year old female, as mentioned, who had initially presented with plasmacytoma and found to have evidence of light chain myeloma involving the bone marrow. She received Revlimid with reasonable control of her disease. This was stopped in October 2013 due to diarrhea. She continues to have diarrhea, although this is less frequent since stopping Revlimid. She had not had any nausea, not had any abdominal distension.  She does report some pruritus at times, but really no rash or desquamation.  Reports that she has a numbness and prickly sensation to her hands and feet that also improved since stopping Revlimid. For the most part, performance status and activity level continue to be at baseline.  She had not had any back pain.  Had not had any pathological fractures.  Had not had any falls at this time. Her appetite is still good.    Medications: I have reviewed the patient's current medications. Current outpatient prescriptions:amLODipine (NORVASC) 5 MG tablet, Take 1 tablet (5 mg total) by mouth daily., Disp: 31 tablet, Rfl: 11;  aspirin 81 MG tablet, Take 1 tablet (81 mg total)  by mouth daily., Disp: 31 tablet, Rfl: 11;  Calcium Carbonate-Vitamin D (CALCIUM 600 + D PO), Take 1 tablet by mouth every morning. , Disp: , Rfl:  hydrochlorothiazide (MICROZIDE) 12.5 MG capsule, Take 1 capsule (12.5 mg total) by mouth daily., Disp: 30 capsule, Rfl: 3;  lisinopril (PRINIVIL,ZESTRIL) 40 MG tablet, Take 1 tablet (40 mg total) by mouth daily., Disp: 90 tablet, Rfl: 3;  Loperamide-Simethicone (IMODIUM ADVANCED) 2-125 MG TABS, Take 1 tablet 2 - 3 times per day as needed for diarrhea , Disp: , Rfl:  nitroGLYCERIN (NITROSTAT) 0.4 MG SL tablet, Place 1 tablet (0.4 mg total) under the tongue every 5 (five) minutes as needed for chest pain. Place 1 tablet under tongue for chest pain;  for every  5 minutes for a maximum of 3 times. Call doctor if pain persists., Disp: 90 tablet, Rfl: 11;  potassium chloride SA (K-DUR,KLOR-CON) 20 MEQ tablet, Take 1 tablet (20 mEq total) by mouth daily. Take 1 tablet daily for 7 days, Disp: 7 tablet, Rfl: 0 sertraline (ZOLOFT) 25 MG tablet, Take 1 tablet (25 mg total) by mouth daily., Disp: 30 tablet, Rfl: 3;  simvastatin (ZOCOR) 20 MG tablet, Take 1 tablet (20 mg total) by mouth at bedtime., Disp: 31 tablet, Rfl: 11  Allergies: No Known Allergies  Past Medical History, Surgical history, Social history, and Family History were reviewed and updated.  Review of Systems: Constitutional:  Negative for fever, chills, night sweats, anorexia, weight loss, pain. Cardiovascular: no chest pain or dyspnea on exertion Respiratory: no cough, shortness of breath,  or wheezing Neurological: no TIA or stroke symptoms Dermatological: negative ENT: negative Skin: Negative. Gastrointestinal: no abdominal pain, change in bowel habits, or black or bloody stools Genito-Urinary: no dysuria, trouble voiding, or hematuria Hematological and Lymphatic: negative Breast: negative Musculoskeletal: negative Remaining ROS negative.  Physical Exam: There were no vitals taken for this  visit. ECOG: 1 General appearance: alert Head: Normocephalic, without obvious abnormality, atraumatic Neck: no adenopathy, no carotid bruit, no JVD, supple, symmetrical, trachea midline and thyroid not enlarged, symmetric, no tenderness/mass/nodules Lymph nodes: Cervical, supraclavicular, and axillary nodes normal. Heart:regular rate and rhythm, S1, S2 normal, no murmur, click, rub or gallop Lung:chest clear, no wheezing, rales, normal symmetric air entry Abdomen: soft, non-tender, without masses or organomegaly EXT:no evidence of joint effusion   Lab Results: Lab Results  Component Value Date   WBC 5.9 09/14/2012   HGB 12.2 09/14/2012   HCT 37.0 09/14/2012   MCV 97.4 09/14/2012   PLT 216 09/14/2012    Impression and Plan:  A 79 year old female with the following issues. 1. History of a plasmacytoma of the right femur.  She is status post surgical fixation followed by external beam radiation, doing very well at this time.  No evidence of any changes. 2. 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  She has been off Revlimid for about 5 months. Her kappa free light chain is under reasonable control. Skeletal survey will be repeated in 12/2012. Her symptoms are better now. We plan to keep her Revlimid and restart it with porgression 3. Hypokalemia.  K levels are pending.  4. Bony disease.  Continue to be on calcium and vitamin D supplements.  I will hold off on bisphosphonate for the time being. 5. Follow up in 2 months.     [facility] 3/27/20143:29 PM

## 2012-09-18 LAB — PROTEIN ELECTROPHORESIS, SERUM
Albumin ELP: 57.1 % (ref 55.8–66.1)
Alpha-1-Globulin: 4.8 % (ref 2.9–4.9)
Beta Globulin: 6.2 % (ref 4.7–7.2)
Total Protein, Serum Electrophoresis: 7 g/dL (ref 6.0–8.3)

## 2012-09-18 LAB — KAPPA/LAMBDA LIGHT CHAINS
Kappa:Lambda Ratio: 9.69 — ABNORMAL HIGH (ref 0.26–1.65)
Lambda Free Lght Chn: 1.3 mg/dL (ref 0.57–2.63)

## 2012-09-26 ENCOUNTER — Encounter: Payer: Self-pay | Admitting: Internal Medicine

## 2012-09-26 DIAGNOSIS — R269 Unspecified abnormalities of gait and mobility: Secondary | ICD-10-CM | POA: Insufficient documentation

## 2012-10-02 ENCOUNTER — Encounter: Payer: Self-pay | Admitting: Radiation Oncology

## 2012-10-02 ENCOUNTER — Ambulatory Visit (INDEPENDENT_AMBULATORY_CARE_PROVIDER_SITE_OTHER): Payer: Medicare Other | Admitting: Radiation Oncology

## 2012-10-02 VITALS — BP 144/67 | HR 77 | Temp 97.7°F | Ht 65.0 in | Wt 127.8 lb

## 2012-10-02 DIAGNOSIS — H9319 Tinnitus, unspecified ear: Secondary | ICD-10-CM

## 2012-10-02 DIAGNOSIS — I10 Essential (primary) hypertension: Secondary | ICD-10-CM

## 2012-10-02 DIAGNOSIS — H9313 Tinnitus, bilateral: Secondary | ICD-10-CM

## 2012-10-02 MED ORDER — NITROGLYCERIN 0.4 MG SL SUBL: 0.4000 mg | SUBLINGUAL_TABLET | SUBLINGUAL | Status: DC | PRN

## 2012-10-02 MED ORDER — AMLODIPINE BESYLATE 5 MG PO TABS: 5.0000 mg | ORAL_TABLET | Freq: Every day | ORAL | Status: DC

## 2012-10-02 MED ORDER — SIMVASTATIN 20 MG PO TABS: 20.0000 mg | ORAL_TABLET | Freq: Every day | ORAL | Status: DC

## 2012-10-02 NOTE — Assessment & Plan Note (Signed)
BP Readings from Last 3 Encounters:  10/02/12 144/67  09/14/12 157/80  09/08/12 142/74    Lab Results  Component Value Date   NA 141 09/14/2012   K 3.9 09/14/2012   CREATININE 1.0 09/14/2012    Assessment: Blood pressure control: mildly elevated Progress toward BP goal:  unchanged Comments: BP slightly elevated today, likely 2/2 to pt running out of norvasc a few days ago.    Plan: Medications:  continue current medications Educational resources provided: handout Self management tools provided: home blood pressure logbook Other plans: patient was given a refill of her norvasc, and instructed to continue her norvasc, lisinopril and HCTZ with no changes.

## 2012-10-02 NOTE — Patient Instructions (Addendum)
Please continue to take all of your medications as prescribed. We will have you return to clinic for a follow-up visit with your PCP in approximately 1 month. Have a great day.

## 2012-10-02 NOTE — Assessment & Plan Note (Signed)
Complains of hearing ringing in bilateral ears, R>L. Patient's tinnitus is likely secondary to one or more ototoxic medications. Revlimid is the most likely cause, however ACEis, CCBs, and ASA all can cause/exacerbate tinnitus as well. Pt has been taking 325mg  ASA daily, although only prescribed 81mg  ASA daily. She was instructed to begin taking the 81mg  ASA dose, as this may be contributing to her symptoms.

## 2012-10-02 NOTE — Progress Notes (Signed)
Subjective:    Patient ID: Kayla Brooks, female    DOB: 1930/04/27, 79 y.o.   MRN: [id number]  HPI Patient is a 79 year old woman with PMH significant for multiple myeloma, hypertension, who presents to clinic today for followup on her hypertension. On the patient's previous visit on 09/08/2012, her blood pressure was mildly elevated at 142/74. She was started on HCTZ 12.5 mg daily at that time and was instructed to otherwise continue her lisinopril and norvasc with no change in doses. She states she has been taking all of these medications with the exception of norvasc, as she ran out of this medication several days ago.   She has no new complaints today, but admits to dizziness and a ringing sound in the ears, which has been present for at least several months.   Review of Systems  All other systems reviewed and are negative.    Current Outpatient Medications: Current Outpatient Prescriptions  Medication Sig Dispense Refill  . amLODipine (NORVASC) 5 MG tablet Take 1 tablet (5 mg total) by mouth daily.  31 tablet  11  . aspirin 81 MG tablet Take 1 tablet (81 mg total) by mouth daily.  31 tablet  11  . Calcium Carbonate-Vitamin D (CALCIUM 600 + D PO) Take 1 tablet by mouth every morning.       . hydrochlorothiazide (MICROZIDE) 12.5 MG capsule Take 1 capsule (12.5 mg total) by mouth daily.  30 capsule  3  . lisinopril (PRINIVIL,ZESTRIL) 40 MG tablet Take 1 tablet (40 mg total) by mouth daily.  90 tablet  3  . Loperamide-Simethicone (IMODIUM ADVANCED) 2-125 MG TABS Take 1 tablet 2 - 3 times per day as needed for diarrhea       . nitroGLYCERIN (NITROSTAT) 0.4 MG SL tablet Place 1 tablet (0.4 mg total) under the tongue every 5 (five) minutes as needed for chest pain. Place 1 tablet under tongue for chest pain;  for every  5 minutes for a maximum of 3 times. Call doctor if pain persists.  90 tablet  11  . potassium chloride SA (K-DUR,KLOR-CON) 20 MEQ tablet Take 1 tablet (20 mEq total) by mouth  daily. Take 1 tablet daily for 7 days  7 tablet  0  . sertraline (ZOLOFT) 25 MG tablet Take 1 tablet (25 mg total) by mouth daily.  30 tablet  3  . simvastatin (ZOCOR) 20 MG tablet Take 1 tablet (20 mg total) by mouth at bedtime.  31 tablet  11   No current facility-administered medications for this visit.    Allergies: No Known Allergies   Past Medical History: Past Medical History  Diagnosis Date  . Multiple myeloma without mention of remission   . Monoclonal paraproteinemia     Past Surgical History: Past Surgical History  Procedure Laterality Date  . Multiple mye      Family History: No family history on file.  Social History: History   Social History  . Marital Status: Widowed    Spouse Name: N/A    Number of Children: N/A  . Years of Education: N/A   Occupational History  . Not on file.   Social History Main Topics  . Smoking status: Former Games developer  . Smokeless tobacco: Not on file  . Alcohol Use: No  . Drug Use: No  . Sexually Active: Not on file   Other Topics Concern  . Not on file   Social History Narrative  . No narrative on file     Vital  Signs: There were no vitals taken for this visit.      Objective:   Physical Exam  Constitutional: She is oriented to person, place, and time. She appears well-developed and well-nourished. No distress.  HENT:  Head: Normocephalic and atraumatic.  Eyes: Conjunctivae are normal. Pupils are equal, round, and reactive to light. No scleral icterus.  Neck: Normal range of motion. Neck supple. No tracheal deviation present.  Cardiovascular: Normal rate and regular rhythm.   No murmur heard. Pulmonary/Chest: Effort normal. She has no wheezes. She has no rales.  Abdominal: Soft. Bowel sounds are normal. She exhibits no distension. There is no tenderness.  Musculoskeletal: Normal range of motion. She exhibits no edema.  Neurological: She is alert and oriented to person, place, and time. No cranial nerve  deficit.  Skin: Skin is warm and dry. No erythema.  Psychiatric: She has a normal mood and affect. Her behavior is normal.          Assessment & Plan:

## 2012-10-09 ENCOUNTER — Encounter: Payer: Self-pay | Admitting: *Deleted

## 2012-10-23 NOTE — Progress Notes (Signed)
INTERNAL MEDICINE TEACHING ATTENDING ADDENDUM - Inez Catalina, MD: I reviewed with the resident Dr. Lavena Bullion, Ms. Croker's  medical history, physical examination, diagnosis and results of tests and treatment and I agree with the patient's care as documented.

## 2012-11-10 ENCOUNTER — Ambulatory Visit (INDEPENDENT_AMBULATORY_CARE_PROVIDER_SITE_OTHER): Payer: Medicare Other | Admitting: Internal Medicine

## 2012-11-10 ENCOUNTER — Encounter: Payer: Self-pay | Admitting: Internal Medicine

## 2012-11-10 VITALS — BP 122/58 | HR 53 | Temp 97.3°F | Ht 65.5 in | Wt 126.4 lb

## 2012-11-10 DIAGNOSIS — G4700 Insomnia, unspecified: Secondary | ICD-10-CM

## 2012-11-10 DIAGNOSIS — F329 Major depressive disorder, single episode, unspecified: Secondary | ICD-10-CM

## 2012-11-10 DIAGNOSIS — I10 Essential (primary) hypertension: Secondary | ICD-10-CM

## 2012-11-10 MED ORDER — TRAZODONE HCL 50 MG PO TABS: 25.0000 mg | ORAL_TABLET | Freq: Every day | ORAL | Status: DC

## 2012-11-10 NOTE — Patient Instructions (Addendum)
1.  It was a pleasure seeing you today.  You are doing well!  2.  Start Trazodone 50 mg tablets.  Take 1/2 to 1 tablet at bedtime to help you rest  3.  Keep your follow up with Dr. Clelia Croft for the x-rays in July.  4.  If you get a persistent new pain please make a follow up with Dr. Clelia Croft.  5.  Follow up in August or September to meet your new primary care doctor.  6.  It has been an absolute pleasure caring for your over the last few years.  I wish you all the best in the future.

## 2012-11-10 NOTE — Progress Notes (Signed)
Subjective:   Patient ID: Kayla Brooks female   DOB: 1929-08-23 79 y.o.   MRN: [id number]  HPI: [name] is a 79 y.o. woman with a PMH significant for hypertension.  She states that she is taking her medications and is feeling well.  See Problem focused Assessment and Plan for full details of her chronic medical conditions.  She also has a history of multiple myeloma and is followed by Dr. Clelia [facility] at the [facility].  She reports that she has been doing well and is off medications with periodic monitoring for now.    She states that recently she has had problems with her sleep.  She states that she tries to go to bed around 9-10 pm and will sometimes take several hours to fall asleep.  When she is able to drift off to sleep she then will wake up 1-2 times each night and have a hard time falling back to sleep at that time.  She usually gets out of bed for the day around 7-8 am.  She feels tired when she wakes up and usually ends up napping during the day.  She states that her mood has been "down" over the last few months while the sleeping problem has gotten worse.  She denies SI, HI, or feelings of hopelessness.  She states that she is more irritable recently as well.  She has been taking her Zoloft 25 mg occasionally when she feels like she is very irritable.    Past Medical History  Diagnosis Date  . Multiple myeloma without mention of remission   . Monoclonal paraproteinemia    Current Outpatient Prescriptions  Medication Sig Dispense Refill  . amLODipine (NORVASC) 5 MG tablet Take 1 tablet (5 mg total) by mouth daily.  30 tablet  5  . aspirin 81 MG tablet Take 1 tablet (81 mg total) by mouth daily.  31 tablet  11  . Calcium Carbonate-Vitamin D (CALCIUM 600 + D PO) Take 1 tablet by mouth every morning.       . hydrochlorothiazide (MICROZIDE) 12.5 MG capsule Take 1 capsule (12.5 mg total) by mouth daily.  30 capsule  3  . lisinopril (PRINIVIL,ZESTRIL) 40 MG tablet Take 1 tablet (40 mg  total) by mouth daily.  90 tablet  3  . Loperamide-Simethicone (IMODIUM ADVANCED) 2-125 MG TABS Take 1 tablet 2 - 3 times per day as needed for diarrhea       . nitroGLYCERIN (NITROSTAT) 0.4 MG SL tablet Place 1 tablet (0.4 mg total) under the tongue every 5 (five) minutes as needed for chest pain.  90 tablet  5  . potassium chloride SA (K-DUR,KLOR-CON) 20 MEQ tablet Take 1 tablet (20 mEq total) by mouth daily. Take 1 tablet daily for 7 days  7 tablet  0  . sertraline (ZOLOFT) 25 MG tablet Take 1 tablet (25 mg total) by mouth daily.  30 tablet  3  . simvastatin (ZOCOR) 20 MG tablet Take 1 tablet (20 mg total) by mouth at bedtime.  31 tablet  5   No current facility-administered medications for this visit.   No family history on file. History   Social History  . Marital Status: Widowed    Spouse Name: N/A    Number of Children: N/A  . Years of Education: N/A   Social History Main Topics  . Smoking status: Former Games developer  . Smokeless tobacco: None  . Alcohol Use: No  . Drug Use: No  . Sexually Active: None  Other Topics Concern  . None   Social History Narrative  . None   Review of Systems: A full 12 system ROS is negative except as noted in the HPI and A&P.   Objective:  Physical Exam: Filed Vitals:   11/10/12 1534  BP: 122/58  Pulse: 53  Temp: 97.3 F (36.3 C)  TempSrc: Oral  Height: 5' 5.5" (1.664 m)  Weight: 126 lb 6.4 oz (57.335 kg)  SpO2: 100%   Constitutional: Vital signs reviewed.  Patient is a well-developed and well-nourished woman in no acute distress and cooperative with exam. Alert and oriented x3.  Head: Normocephalic and atraumatic Ear: TM normal bilaterally Mouth: no erythema or exudates, MMM Eyes: PERRL, EOMI, conjunctivae normal, No scleral icterus.  Neck: Supple, Trachea midline normal ROM, No JVD, mass, thyromegaly, or carotid bruit present.  Cardiovascular: RRR, S1 normal, S2 normal, no MRG, pulses symmetric and intact  bilaterally Pulmonary/Chest: CTAB, no wheezes, rales, or rhonchi Abdominal: Soft. Non-tender, non-distended, bowel sounds are normal, no masses, organomegaly, or guarding present.  GU: no CVA tenderness Musculoskeletal: No joint deformities, erythema, or stiffness, ROM full and no nontender Hematology: no cervical, inginal, or axillary adenopathy.  Neurological: A&O x3, Strength is normal and symmetric bilaterally, cranial nerve II-XII are grossly intact, no focal motor deficit, sensory intact to light touch bilaterally.  Skin: Warm, dry and intact. No rash, cyanosis, or clubbing.  Psychiatric: Normal mood and flat affect. speech is goal directed and behavior is normal. Judgment, insight, and thought content normal. Cognition and memory are normal.   Assessment & Plan:

## 2012-11-14 ENCOUNTER — Encounter: Payer: Self-pay | Admitting: Oncology

## 2012-11-14 ENCOUNTER — Other Ambulatory Visit (HOSPITAL_BASED_OUTPATIENT_CLINIC_OR_DEPARTMENT_OTHER): Payer: Medicare Other

## 2012-11-14 ENCOUNTER — Ambulatory Visit (HOSPITAL_BASED_OUTPATIENT_CLINIC_OR_DEPARTMENT_OTHER): Payer: Medicare Other | Admitting: Oncology

## 2012-11-14 VITALS — BP 118/56 | HR 72 | Temp 98.7°F | Resp 18 | Ht 65.5 in | Wt 127.7 lb

## 2012-11-14 DIAGNOSIS — C9000 Multiple myeloma not having achieved remission: Secondary | ICD-10-CM

## 2012-11-14 NOTE — Progress Notes (Signed)
Hematology and Oncology Follow Up Visit  Kayla Brooks [id number] [birth year] 79 y.o. 11/14/2012 3:04 PM  CC: Kayla Showers, MD  Kayla Sans. Daleen Squibb, MD, [facility]  Radene Gunning, M.D., Ph.D.    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  She had a light chain multiple myeloma with M spike of about 0.35 gm/dL.  Prior Therapy: 1. Status post prophylactic fixation impending pathological fracture of the right femur done in August 2009. 2. External beam radiation concluded in 2010 at the right femur. 3. Revlimid 25 mg daily as maintenance therapy for 2 years ending 03/2012  Current therapy:  Watchful observation.  Interim History:  [name] presents today for a followup visit. She is  an 79 year old female, as mentioned, who had initially presented with plasmacytoma and found to have evidence of light chain myeloma involving the bone marrow. She received Revlimid with reasonable control of her disease. This was stopped in October 2013 due to diarrhea. She continues to have diarrhea, although this is less frequent since stopping Revlimid. She had not had any nausea, not had any abdominal distension.  She does report some pruritus at times, but really no rash or desquamation.  Reports that she has a numbness and prickly sensation to her hands and feet that also improved since stopping Revlimid. For the most part, performance status and activity level continue to be at baseline.  She had not had any back pain.  Had not had any pathological fractures.  Had not had any falls at this time. Her appetite is still good.    Medications: I have reviewed the patient's current medications. Current outpatient prescriptions:amLODipine (NORVASC) 5 MG tablet, Take 1 tablet (5 mg total) by mouth daily., Disp: 30 tablet, Rfl: 5;  aspirin 81 MG tablet, Take 1 tablet (81 mg total)  by mouth daily., Disp: 31 tablet, Rfl: 11;  Calcium Carbonate-Vitamin D (CALCIUM 600 + D PO), Take 1 tablet by mouth every morning. , Disp: , Rfl:  hydrochlorothiazide (MICROZIDE) 12.5 MG capsule, Take 1 capsule (12.5 mg total) by mouth daily., Disp: 30 capsule, Rfl: 3;  lisinopril (PRINIVIL,ZESTRIL) 40 MG tablet, Take 1 tablet (40 mg total) by mouth daily., Disp: 90 tablet, Rfl: 3;  Loperamide-Simethicone (IMODIUM ADVANCED) 2-125 MG TABS, Take 1 tablet 2 - 3 times per day as needed for diarrhea , Disp: , Rfl:  nitroGLYCERIN (NITROSTAT) 0.4 MG SL tablet, Place 1 tablet (0.4 mg total) under the tongue every 5 (five) minutes as needed for chest pain., Disp: 90 tablet, Rfl: 5;  sertraline (ZOLOFT) 25 MG tablet, Take 1 tablet (25 mg total) by mouth daily., Disp: 30 tablet, Rfl: 3;  simvastatin (ZOCOR) 20 MG tablet, Take 1 tablet (20 mg total) by mouth at bedtime., Disp: 31 tablet, Rfl: 5 traZODone (DESYREL) 50 MG tablet, Take 0.5-1 tablets (25-50 mg total) by mouth at bedtime., Disp: 30 tablet, Rfl: 1  Allergies: No Known Allergies  Past Medical History, Surgical history, Social history, and Family History were reviewed and updated.  Review of Systems: Constitutional:  Negative for fever, chills, night sweats, anorexia, weight loss, pain. Cardiovascular: no chest pain or dyspnea on exertion Respiratory: no cough, shortness of breath, or wheezing Neurological: no TIA or stroke symptoms Dermatological: negative ENT: negative Skin: Negative. Gastrointestinal: no abdominal pain, change in bowel habits, or black or bloody stools Genito-Urinary: no dysuria, trouble voiding, or  hematuria Hematological and Lymphatic: negative Breast: negative Musculoskeletal: negative Remaining ROS negative.  Physical Exam: Blood pressure 118/56, pulse 72, temperature 98.7 F (37.1 C), temperature source Oral, resp. rate 18, height 5' 5.5" (1.664 m), weight 127 lb 11.2 oz (57.924 kg). ECOG: 1 General appearance:  alert Head: Normocephalic, without obvious abnormality, atraumatic Neck: no adenopathy, no carotid bruit, no JVD, supple, symmetrical, trachea midline and thyroid not enlarged, symmetric, no tenderness/mass/nodules Lymph nodes: Cervical, supraclavicular, and axillary nodes normal. Heart:regular rate and rhythm, S1, S2 normal, no murmur, click, rub or gallop Lung:chest clear, no wheezing, rales, normal symmetric air entry Abdomen: soft, non-tender, without masses or organomegaly EXT:no evidence of joint effusion   Lab Results: Lab Results  Component Value Date   WBC 5.9 09/14/2012   HGB 12.2 09/14/2012   HCT 37.0 09/14/2012   MCV 97.4 09/14/2012   PLT 216 09/14/2012    Impression and Plan:  A 79 year old female with the following issues. 1. History of a plasmacytoma of the right femur.  She is status post surgical fixation followed by external beam radiation, doing very well at this time.  No evidence of any changes. 2. 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  She has been off Revlimid for about 7 months. Her kappa free light chain is under reasonable control. Skeletal survey will be repeated in 12/2012. Her symptoms are better now. We plan to keep her off Revlimid and restart it with progression.  3. Bony disease.  Continue to be on calcium and vitamin D supplements.  I will hold off on bisphosphonate for the time being. 4. Follow up in 2 months.     Clenton Pare 5/27/20143:04 PM

## 2012-11-17 LAB — SPEP & IFE WITH QIG
Albumin ELP: 54.1 % — ABNORMAL LOW (ref 55.8–66.1)
Alpha-1-Globulin: 5.3 % — ABNORMAL HIGH (ref 2.9–4.9)
Alpha-2-Globulin: 11.8 % (ref 7.1–11.8)
Beta 2: 5.6 % (ref 3.2–6.5)
Beta Globulin: 6.8 % (ref 4.7–7.2)
Gamma Globulin: 16.4 % (ref 11.1–18.8)

## 2012-11-17 LAB — KAPPA/LAMBDA LIGHT CHAINS
Kappa free light chain: 25.7 mg/dL — ABNORMAL HIGH (ref 0.33–1.94)
Kappa:Lambda Ratio: 17.97 — ABNORMAL HIGH (ref 0.26–1.65)
Lambda Free Lght Chn: 1.43 mg/dL (ref 0.57–2.63)

## 2012-11-22 NOTE — Assessment & Plan Note (Signed)
She has a mixed insomnia picture with problems with sleep initiation as well as problems with sleep maintenance.  We discussed several options for her sleep and she would like to start with Trazodone.  We will start at a lower dose and have her increase if she is still having problems.  We discussed good sleep hygiene as well.  She will follow up in 4 weeks to see how she is doing and if needed we can increase her trazodone at that time.

## 2012-11-22 NOTE — Assessment & Plan Note (Signed)
BP Readings from Last 3 Encounters:  11/10/12 122/58  10/02/12 144/67  09/14/12 157/80   Lab Results  Component Value Date   NA 141 09/14/2012   K 3.9 09/14/2012   CREATININE 1.0 09/14/2012    Assessment: Blood pressure control: controlled Progress toward BP goal:  at goal Comments: She states that she is taking her medications and denies any side effects including ankle swelling, dizziness on standing.  She also denies signs of hypertension including chest pain, SOB, headache, or blurry vision.   Plan: Medications:  continue current medications, including amlodipine 5 mg, HCTZ 12.5 mg and Lisinopril 40 mg daily.   Educational resources provided: Education officer, environmental tools provided:   Other plans:

## 2012-11-22 NOTE — Assessment & Plan Note (Signed)
She has a history of depression and has been on a baby dose of Zoloft for sometime and has only been taking the medication every few days.  We discussed that with her mood and irritability that she should take it every day and if it helps a little but not completely that we have lots of room to increase the dose to see if it helps her more.

## 2012-12-06 ENCOUNTER — Telehealth: Payer: Self-pay | Admitting: Oncology

## 2012-12-18 NOTE — Assessment & Plan Note (Signed)
She states that she has been taking her amlodipine and lisinopril.  She denies problems with her blood pressure.  BP Readings from Last 3 Encounters:  09/08/12 142/74  07/17/12 168/75  05/16/12 151/70   Blood pressure is better today but still mildly above her goal.  We will add a small amount of HCTZ 12.5 mg daily for to get her to goal.

## 2012-12-18 NOTE — Assessment & Plan Note (Signed)
She has a corn which is the site of the pain.  The corn is from the toes rubbing due to her hallux valgus deformity and bunions.  We padded the area and showed her how to do it so she can keep it padded.  She was cautioned against trying to remove the corn her self.  We will refer her over to podiatry to see if they have bracing or if she needs surgery to correct the deformity and ease her pain.

## 2012-12-18 NOTE — Assessment & Plan Note (Signed)
Received Tdap today.  

## 2012-12-18 NOTE — Assessment & Plan Note (Signed)
Mood is good today and she states that she has been taking the Zoloft daily.  We will continue to monitor.

## 2013-01-09 ENCOUNTER — Other Ambulatory Visit (HOSPITAL_BASED_OUTPATIENT_CLINIC_OR_DEPARTMENT_OTHER): Payer: Medicare Other | Admitting: Lab

## 2013-01-09 ENCOUNTER — Ambulatory Visit (HOSPITAL_COMMUNITY)
Admission: RE | Admit: 2013-01-09 | Discharge: 2013-01-09 | Disposition: A | Discharge status: Home / Self Care | Payer: Medicare Other | Source: Ambulatory Visit | Attending: Oncology | Admitting: Oncology

## 2013-01-09 DIAGNOSIS — Q762 Congenital spondylolisthesis: Secondary | ICD-10-CM | POA: Diagnosis not present

## 2013-01-09 DIAGNOSIS — C9000 Multiple myeloma not having achieved remission: Secondary | ICD-10-CM

## 2013-01-09 DIAGNOSIS — I517 Cardiomegaly: Secondary | ICD-10-CM | POA: Insufficient documentation

## 2013-01-09 DIAGNOSIS — M4180 Other forms of scoliosis, site unspecified: Secondary | ICD-10-CM | POA: Insufficient documentation

## 2013-01-09 DIAGNOSIS — M949 Disorder of cartilage, unspecified: Secondary | ICD-10-CM | POA: Insufficient documentation

## 2013-01-09 DIAGNOSIS — I700 Atherosclerosis of aorta: Secondary | ICD-10-CM | POA: Diagnosis not present

## 2013-01-09 DIAGNOSIS — M899 Disorder of bone, unspecified: Secondary | ICD-10-CM | POA: Insufficient documentation

## 2013-01-09 DIAGNOSIS — M5030 Other cervical disc degeneration, unspecified cervical region: Secondary | ICD-10-CM | POA: Diagnosis not present

## 2013-01-09 LAB — CBC WITH DIFFERENTIAL/PLATELET
Basophils Absolute: 0 10*3/uL (ref 0.0–0.1)
EOS%: 0.8 % (ref 0.0–7.0)
HCT: 32.7 % — ABNORMAL LOW (ref 34.8–46.6)
HGB: 10.9 g/dL — ABNORMAL LOW (ref 11.6–15.9)
LYMPH%: 22.5 % (ref 14.0–49.7)
MCH: 31.8 pg (ref 25.1–34.0)
MCV: 95.4 fL (ref 79.5–101.0)
NEUT%: 69.7 % (ref 38.4–76.8)
Platelets: 186 10*3/uL (ref 145–400)
lymph#: 1.4 10*3/uL (ref 0.9–3.3)

## 2013-01-09 LAB — COMPREHENSIVE METABOLIC PANEL (CC13)
AST: 23 U/L (ref 5–34)
BUN: 16.6 mg/dL (ref 7.0–26.0)
Calcium: 9.3 mg/dL (ref 8.4–10.4)
Chloride: 107 mEq/L (ref 98–109)
Creatinine: 0.9 mg/dL (ref 0.6–1.1)
Total Bilirubin: 0.87 mg/dL (ref 0.20–1.20)

## 2013-01-17 ENCOUNTER — Telehealth: Payer: Self-pay | Admitting: Oncology

## 2013-01-17 ENCOUNTER — Ambulatory Visit (HOSPITAL_BASED_OUTPATIENT_CLINIC_OR_DEPARTMENT_OTHER): Payer: Medicare Other | Admitting: Oncology

## 2013-01-17 ENCOUNTER — Ambulatory Visit: Payer: Medicare Other | Admitting: Oncology

## 2013-01-17 VITALS — BP 165/73 | HR 63 | Temp 97.4°F | Resp 18 | Ht 65.5 in | Wt 129.2 lb

## 2013-01-17 DIAGNOSIS — C9000 Multiple myeloma not having achieved remission: Secondary | ICD-10-CM | POA: Diagnosis not present

## 2013-01-17 MED ORDER — HYDROCORTISONE 1 % EX CREA: TOPICAL_CREAM | Freq: Two times a day (BID) | CUTANEOUS | Status: DC

## 2013-01-17 NOTE — Progress Notes (Signed)
Hematology and Oncology Follow Up Visit  Kayla Brooks [id number] Jan 19, 1930 79 y.o. 01/17/2013 9:37 AM  CC: Elby Showers, MD  Jesse Sans. Daleen Squibb, MD, [facility]  Radene Gunning, M.D., Ph.D.    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  She had a light chain multiple myeloma with M spike of about 0.35 gm/dL.  Prior Therapy: 1. Status post prophylactic fixation impending pathological fracture of the right femur done in August 2009. 2. External beam radiation concluded in 2010 at the right femur. 3. Revlimid 25 mg daily as maintenance therapy for 2 years ending 03/2012  Current therapy:  Watchful observation.  Interim History:  [name] presents today for a followup visit. She is  an 79 year old female, as mentioned, who had initially presented with plasmacytoma and found to have evidence of light chain myeloma involving the bone marrow. She received Revlimid with reasonable control of her disease. This was stopped in October 2013 due to diarrhea. She continues to have diarrhea, although this is less frequent since stopping Revlimid. She had not had any nausea, not had any abdominal distension.  She does report some pruritus at times, but really no rash or desquamation.  Reports that she has a numbness and prickly sensation to her hands and feet that also improved since stopping Revlimid but not completely resolved. For the most part, performance status and activity level continue to be at baseline.  She had not had any back pain.  Had not had any pathological fractures.  Had not had any falls at this time. Her appetite is still good.    Medications: I have reviewed the patient's current medications.  Current Outpatient Prescriptions  Medication Sig Dispense Refill  . amLODipine (NORVASC) 5 MG tablet Take 1 tablet (5 mg total) by mouth daily.  30 tablet   5  . aspirin 81 MG tablet Take 1 tablet (81 mg total) by mouth daily.  31 tablet  11  . Calcium Carbonate-Vitamin D (CALCIUM 600 + D PO) Take 1 tablet by mouth every morning.       . hydrochlorothiazide (MICROZIDE) 12.5 MG capsule Take 1 capsule (12.5 mg total) by mouth daily.  30 capsule  3  . lisinopril (PRINIVIL,ZESTRIL) 40 MG tablet Take 1 tablet (40 mg total) by mouth daily.  90 tablet  3  . Loperamide-Simethicone (IMODIUM ADVANCED) 2-125 MG TABS Take 1 tablet 2 - 3 times per day as needed for diarrhea       . nitroGLYCERIN (NITROSTAT) 0.4 MG SL tablet Place 1 tablet (0.4 mg total) under the tongue every 5 (five) minutes as needed for chest pain.  90 tablet  5  . sertraline (ZOLOFT) 25 MG tablet Take 1 tablet (25 mg total) by mouth daily.  30 tablet  3  . simvastatin (ZOCOR) 20 MG tablet Take 1 tablet (20 mg total) by mouth at bedtime.  31 tablet  5  . traZODone (DESYREL) 50 MG tablet Take 0.5-1 tablets (25-50 mg total) by mouth at bedtime.  30 tablet  1  . hydrocortisone cream 1 % Apply topically 2 (two) times daily.  30 g  0   No current facility-administered medications for this visit.  , Allergies: No Known Allergies  Past Medical History, Surgical history, Social history, and Family History were reviewed and updated.  Review of Systems:  Remaining ROS negative.  Physical Exam:  Blood pressure 165/73, pulse 63, temperature 97.4 F (36.3 C), temperature source Oral, resp. rate 18, height 5' 5.5" (1.664 m), weight 129 lb 3.2 oz (58.605 kg). ECOG: 1 General appearance: alert Head: Normocephalic, without obvious abnormality, atraumatic Neck: no adenopathy, no carotid bruit, no JVD, supple, symmetrical, trachea midline and thyroid not enlarged, symmetric, no tenderness/mass/nodules Lymph nodes: Cervical, supraclavicular, and axillary nodes normal. Heart:regular rate and rhythm, S1, S2 normal, no murmur, click, rub or gallop Lung:chest clear, no wheezing, rales, normal symmetric air  entry Abdomen: soft, non-tender, without masses or organomegaly EXT:no evidence of joint effusion Skin: No rash or desquamation.   Lab Results: Lab Results  Component Value Date   WBC 6.1 01/09/2013   HGB 10.9* 01/09/2013   HCT 32.7* 01/09/2013   MCV 95.4 01/09/2013   PLT 186 01/09/2013   METASTATIC BONE SURVEY  Comparison: Bone survey 07/18/2013and 02/10/2011  Findings: Skull: Anteriorly, there is a 3-4 mm lucent lesion in  that is not appreciated on prior studies.  Cervical spine: Multilevel degenerative disc disease, most  prominent at C3-C4 is noted. No focal lytic lesions are  identified.  Thoracic spine: Stable mild convex left scoliosis of the lower  thoracic spine. Vertebral bodies are normal in height and  alignment. No focal osseous lesion is identified.  Spine: Bones appear osteopenic. The lumbar spine vertebral bodies  are normal in height. Grade 1 anterolisthesis of L5 on S1. There  is facet joint degenerative changes of the lower lumbar spine. No  focal lytic lesion or fracture is identified.  Pelvis: Postoperative changes of gamma nail fixation of the  proximal right femur. Prompt amount of stool in the colon noted  and projects over the iliac bones bilaterally. No fracture or  discrete lytic lesion is identified. Stable mild degenerative  changes of both hips.  Right femur: Gamma nail on the right femur is stable with  surrounding heterotopic ossification. No fracture focal lytic  lesions. Osteopenia.  Left femur: No focal lytic lesion is identified.  Right shoulder/humerus: No fracture or focal lytic lesion.  Left shoulder/humerus: No fracture or focal lytic lesion.  Chest: Mild cardiomegaly and atherosclerotic calcification of the  thoracic aortic arch. The lungs are clear. No focal osseous  lesion of the ribs identified. Right nipple shadow noted.  IMPRESSION:  A single 3-4 mm lucency in the calvarium appears new. This could  be secondary to multiple myeloma.  Otherwise, the skeletal survey  is stable. No additional potential lytic lesions are seen.  Impression and Plan:  A 79 year old female with the following issues. 1. History of a plasmacytoma of the right femur.  She is status post surgical fixation followed by external beam radiation, doing very well at this time.  No evidence of any changes. 2. 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  She has been off Revlimid since 03/2012. Her kappa free light chain is under reasonable control. Skeletal survey repeated in 12/2012 and shows no major changes. Her symptoms are better now. We plan to keep her off Revlimid and restart it with progression.  3. Bony disease.  Continue to be on calcium and vitamin D supplements.  I will hold off on bisphosphonate for the time being. 4. Follow up in 3 months 5. Hand irritation: no rash noted. I recommended steroid or antihistamine cream to help with that.     Glory Graefe 7/30/20149:37 AM

## 2013-01-17 NOTE — Telephone Encounter (Signed)
gv pt appt schedule for October.  °

## 2013-03-29 DIAGNOSIS — C9030 Solitary plasmacytoma not having achieved remission: Secondary | ICD-10-CM | POA: Diagnosis not present

## 2013-03-29 DIAGNOSIS — M25559 Pain in unspecified hip: Secondary | ICD-10-CM | POA: Diagnosis not present

## 2013-04-06 ENCOUNTER — Emergency Department (HOSPITAL_COMMUNITY)
Admission: EM | Admit: 2013-04-06 | Discharge: 2013-04-06 | Disposition: A | Discharge status: Home / Self Care | Payer: Medicare Other | Attending: Emergency Medicine | Admitting: Emergency Medicine

## 2013-04-06 ENCOUNTER — Telehealth: Payer: Self-pay | Admitting: *Deleted

## 2013-04-06 ENCOUNTER — Encounter (HOSPITAL_COMMUNITY): Payer: Self-pay | Admitting: Emergency Medicine

## 2013-04-06 ENCOUNTER — Emergency Department (HOSPITAL_COMMUNITY): Payer: Medicare Other

## 2013-04-06 ENCOUNTER — Ambulatory Visit (INDEPENDENT_AMBULATORY_CARE_PROVIDER_SITE_OTHER): Payer: Medicare Other | Admitting: Internal Medicine

## 2013-04-06 VITALS — BP 132/71 | HR 57 | Temp 97.7°F | Ht 65.0 in | Wt 132.3 lb

## 2013-04-06 DIAGNOSIS — C9000 Multiple myeloma not having achieved remission: Secondary | ICD-10-CM | POA: Diagnosis not present

## 2013-04-06 DIAGNOSIS — Z23 Encounter for immunization: Secondary | ICD-10-CM | POA: Diagnosis not present

## 2013-04-06 DIAGNOSIS — Z885 Allergy status to narcotic agent status: Secondary | ICD-10-CM | POA: Insufficient documentation

## 2013-04-06 DIAGNOSIS — I10 Essential (primary) hypertension: Secondary | ICD-10-CM | POA: Diagnosis not present

## 2013-04-06 DIAGNOSIS — Z79899 Other long term (current) drug therapy: Secondary | ICD-10-CM | POA: Diagnosis not present

## 2013-04-06 DIAGNOSIS — M542 Cervicalgia: Secondary | ICD-10-CM | POA: Diagnosis not present

## 2013-04-06 DIAGNOSIS — Z87891 Personal history of nicotine dependence: Secondary | ICD-10-CM | POA: Insufficient documentation

## 2013-04-06 DIAGNOSIS — Z7982 Long term (current) use of aspirin: Secondary | ICD-10-CM | POA: Insufficient documentation

## 2013-04-06 DIAGNOSIS — R51 Headache: Secondary | ICD-10-CM | POA: Diagnosis not present

## 2013-04-06 DIAGNOSIS — M6281 Muscle weakness (generalized): Secondary | ICD-10-CM | POA: Diagnosis not present

## 2013-04-06 DIAGNOSIS — M79609 Pain in unspecified limb: Secondary | ICD-10-CM | POA: Insufficient documentation

## 2013-04-06 HISTORY — DX: Essential (primary) hypertension: I10

## 2013-04-06 LAB — CBC WITH DIFFERENTIAL/PLATELET
Eosinophils Absolute: 0 10*3/uL (ref 0.0–0.7)
Eosinophils Relative: 1 % (ref 0–5)
HCT: 34.9 % — ABNORMAL LOW (ref 36.0–46.0)
Hemoglobin: 11.7 g/dL — ABNORMAL LOW (ref 12.0–15.0)
Lymphs Abs: 1.3 10*3/uL (ref 0.7–4.0)
MCH: 30.9 pg (ref 26.0–34.0)
MCV: 92.1 fL (ref 78.0–100.0)
Monocytes Relative: 5 % (ref 3–12)
RBC: 3.79 MIL/uL — ABNORMAL LOW (ref 3.87–5.11)

## 2013-04-06 LAB — CBC
HCT: 37.1 % (ref 36.0–46.0)
Hemoglobin: 12.5 g/dL (ref 12.0–15.0)
MCH: 31.3 pg (ref 26.0–34.0)
MCHC: 33.7 g/dL (ref 30.0–36.0)
MCV: 92.8 fL (ref 78.0–100.0)
Platelets: 154 10*3/uL (ref 150–400)
RDW: 12.2 % (ref 11.5–15.5)

## 2013-04-06 LAB — BASIC METABOLIC PANEL
BUN: 19 mg/dL (ref 6–23)
Calcium: 9.5 mg/dL (ref 8.4–10.5)
Creatinine, Ser: 0.82 mg/dL (ref 0.50–1.10)
GFR calc Af Amer: 76 mL/min — ABNORMAL LOW (ref >90)
GFR calc non Af Amer: 65 mL/min — ABNORMAL LOW (ref >90)
Glucose, Bld: 96 mg/dL (ref 70–99)
Potassium: 4.2 mEq/L (ref 3.5–5.1)

## 2013-04-06 MED ORDER — CALCIUM CARBONATE-VITAMIN D 600-400 MG-UNIT PO TABS: 1.0000 | ORAL_TABLET | Freq: Every day | ORAL | Status: DC

## 2013-04-06 MED ORDER — AMLODIPINE BESYLATE 5 MG PO TABS: 5.0000 mg | ORAL_TABLET | Freq: Every day | ORAL | Status: DC

## 2013-04-06 MED ORDER — IOHEXOL 300 MG/ML  SOLN
100.0000 mL | Freq: Once | INTRAMUSCULAR | Status: AC | PRN
  Administered 2013-04-06: 100 mL via INTRAVENOUS

## 2013-04-06 NOTE — ED Notes (Signed)
Phlebotomy at bedside.

## 2013-04-06 NOTE — Patient Instructions (Addendum)
Dear Ms Tennison,   We are sending you to the ER for a CT scan of the head, to find a reason for your headache. We are also getting blood work done today. It is important that you meet with your oncologist for your headache given your underlying disease of multiple myeloma.  Your appointment with your oncologist is on 04/18/13. We are trying to get you an earlier appointment and will notify you about it.  We will let you know about the results of the CT scan when we get them.   To manage your headaches, a medication will be prescribed after the CT scan. If you feel worsening headaches, or have symptoms of chest pain, palpitations, weakness of arms and legs, slurred speech please go to the ER.  The medications you requested for refill (Amlodipine and Calcium-Vitamin D) have both been sent to your usual pharmacy. Your flu shot will be administered today.  Take care,  [facility] [phone]

## 2013-04-06 NOTE — Progress Notes (Signed)
Subjective:     Patient ID: Kayla Brooks, female   DOB: 1929/09/11, 79 y.o.   MRN: [id number]  HPI This is an 79 yo F w/ hx of HTN and multiple myeloma presenting today w/ new onset headache and need for medication refills.  Headache Began late Sunday (10/12) as sharp pain located on left side over frontal, temporal, ear and neck areas. When it began on Sunday it was constant, and on this day it was the worst it has ever been. After this day it became intermittent, brought on sporadically, and only lasting seconds. Her last headache was at 430 this AM.  HA are not assoc w/ general movements but do worsen if she moves during episode of HA. She further states that HAs will appear when she goes to lie down and occur "right when I hit my pillow." Rest makes the HA better, Goody powder provided some relief as well, but not complete. Tylenol failed to improve symptoms. In addition to her HA, pt experienced throbbing arm pain that went from her upper left back and radiated down her arm. She tried applying muscle relief cream, but her pain did not respond. Her arm stopped bothering her a couple days ago and she states that it no longer gives her trouble. She denies any head trauma and her last fall was 1 yr ago.  She denies dizziness (room spinning), tongue pain, pain with chewing, changes in smell, seeing auras prior to HAs. She endorses occasional light-headedness and nausea on standing that sometimes occurs w/ HA and other times does not, and some replication of left-sided neck pain when she swallows.  She has not had any headaches like this in the past. Denies hx of stroke, though she says she "has a slow heart beat." Similar HA does not appear to run in family to her knowledge.  No new meds, OTCs, herbals. Only allergy is to morphine where pt receives pain in stomach after admin.  Refills Needs refills on amlodipine and Calcium carbonate-Vitamin D.   Review of Systems  Constitutional: Negative  for fever and unexpected weight change.  Eyes: Positive for visual disturbance.       Only has trouble seeing at night and due to light from oncoming cars.  Respiratory: Negative for shortness of breath.   Cardiovascular: Negative for chest pain.   Rest of ROS as per HPI    Objective:   Physical Exam  Constitutional: She is oriented to person, place, and time. She appears well-developed and well-nourished. No distress.  HENT:  Head: Normocephalic and atraumatic.  Right Ear: External ear normal.  Left Ear: External ear normal.  Mouth/Throat: Oropharynx is clear and moist. No oropharyngeal exudate.  No TMJ tenderness. No tenderness to palpation of temporal bone, cervical/upper thoracic spine.   Eyes: Conjunctivae and EOM are normal. Pupils are equal, round, and reactive to light. No scleral icterus.  Pupils appear cloudy w/ light  Neck: Normal range of motion. Neck supple. No tracheal deviation present. No thyromegaly present.  Cardiovascular: Normal rate, regular rhythm and normal heart sounds.  Exam reveals no gallop and no friction rub.   No murmur heard. 2+ radial  Pulmonary/Chest: Effort normal and breath sounds normal. No stridor. No respiratory distress. She has no wheezes. She has no rales.  Musculoskeletal: Normal range of motion. She exhibits no edema and no tenderness.  Strength 4/5 in all extremities bilaterally. No tenderness to palpation of right shoulder, arm, or upper back.  Lymphadenopathy:    She  has no cervical adenopathy.  Neurological: She is alert and oriented to person, place, and time. No cranial nerve deficit. She exhibits normal muscle tone. Coordination normal.  Finger to nose normal, though slow. Heel to shin normal. No focal deficits noted  Skin: Skin is warm and dry. No rash noted. She is not diaphoretic. No erythema.  Psychiatric: She has a normal mood and affect. Her behavior is normal. Judgment and thought content normal.   Filed Vitals:   04/06/13  1459 04/06/13 1625 04/06/13 1626  BP: 125/69 134/72 132/71  Pulse: 68 54 57  Temp: 97.7 F (36.5 C)    TempSrc: Oral    Height: 5\' 5"  (1.651 m)    Weight: 132 lb 4.8 oz (60.011 kg)    SpO2: 100%           _______seperate arms______         Assessment:     This is a well-appearing 79 yo F w/ hx of HTN and multiple myeloma presenting today w/ new onset headache of unknown etiology currently in stable condition and being sent to ER tonight for CT imaging with current plans to manage further as outpatient.   Problem List: 1. Multiple Myeloma hx 2. New onset HA 3. Trouble seeing at night 4. Health maintenance 5. Med refills    Plan:     1. Multiple myeloma hx and new onset HA - Differential dx for new onset HA in 79 yo w/ hx of HTN an MM is broad, including Lytic lesion from MM vs. Giant cell arteritis vs. Migraine vs. SAH vs. Hyperviscosity syndrome. Bone met survey from 01/09/13 demonstrated "A single 3-4 mm lucency in the calvarium appears new." Lytic lesion may cause HA, particularly if located in same region of symptoms, however, pt has no tenderness to palpation of calvarium. Temporal distribution w/ concurrent arm symptoms may indicate arm claudication and therefore suspicious of temporal arteritis, however, pt not having jaw claudication, fever, wt loss, or other symptoms suspicious of temporal arteritis. Additionally, no BP differential in arms today in clinic. Lack of trauma makes SAH unlikely, though cannot r/o this or other brain bleed at this time. Pt has no focal deficits on exam which is reassuring and makes occurrence of a stroke unlikely. BP is normal today making HTN as cause of headache highly unlikely. Hyperviscosity may also cause HA, although pt has not had problems with this in past. Given her age and new onset of HA, migraine would be unusual, although this is most common cause of HA. The fact that the HA is unilateral favors this dx, but lack of aura, non-assoc w/  nausea and presenting age point away from this dx. Additionally, more life-threatening causes of HA must be r/o before simply treating this as migraine. The fact that her HAs keep her up at night and her hx of malignancy are cause for concern.  - Due to pt's hx of malignancy, new onset HA, and the fact that it occurs at night and not necessarily during waking hours will send pt for CT scan today in ER to evaluate for any head pathology that may be the cause of her new onset HA - e.g. Brain bleed, lytic lesion, metastases, etc.  - No medications for HA at this time until CT results received.   - Stop aspirin until results of CT received. - CBC w/ diff to help r/o viscosity syndrome - ESR and CRP to help r/o temporal arteritis - Prompt follow up with oncologist  after CT results received for further evaluation of the possibility that MM is cause of HA.   2. Trouble seeing at night - W/ physical exam finding of cloudy pupils, likely development of cataracts, esp given age. - No further work up or management necessary at this time for this issue.  3. Health maintenance - due for flu vaccination -Received in office today.  4. Med refills - Refills of amiodarone and Calcium carbonate-vitamin D sent to pharmacy.

## 2013-04-06 NOTE — Telephone Encounter (Signed)
Rivka Barbara, nurse with dr Dalphine Handing, calling to ask if patient could be seen sooner, d/t headaches. States she is going to the  E.[facility] Today for a ct scan of the head. Per dr Clelia Croft, will wait for results of ct scan. Glenda notified.

## 2013-04-06 NOTE — ED Notes (Signed)
Pt ambulated to restroom with slow, steady gait. 

## 2013-04-06 NOTE — ED Notes (Addendum)
Pt reports headaches and pain that radiates down into her left arm since Sunday. States that she was [facility] today and brought here for further evaluation. Pt reports intermittent SOB, and weakness. Reports some blurred vision with the headache.

## 2013-04-06 NOTE — Telephone Encounter (Signed)
Pt walked into clinic with c/o headache to left side of head, neck and shoulder.  Soreness to throat when she swallows.  Onset 4 days ago. No change in 4 days.  She has tried tylenol and goodies powders, helped pain for a few hours.   Pain is constant day and night.  Relief only with goodie powders. Rates pain 3/10 now but does increase. No other c/o.  Will see today at 3:00

## 2013-04-06 NOTE — ED Notes (Signed)
Pt ambulated to restroom with slow, steady gait. NAD.

## 2013-04-06 NOTE — ED Notes (Signed)
Pt reports she had blood work done at the clinic. Will hold lab draw at this time.

## 2013-04-06 NOTE — Progress Notes (Signed)
Subjective:     Patient ID: Kayla Brooks, female   DOB: 05-09-1930, 79 y.o.   MRN: [id number]  Headache  Associated symptoms include dizziness and ear pain. Pertinent negatives include no back pain, coughing, eye redness, fever, nausea, neck pain, numbness, photophobia, rhinorrhea, seizures, sore throat, tinnitus, vomiting or weakness.    Detailed history of present illness.  79 year old lady with history of multiple myeloma, cervical disc degeneration comes in with new onset headache, which started Sunday along with left arm pain. Her arm pain subsided but her headache kept bothering her.   The headache was antero-laterally located in the left temporal region, intermittent and sharp in nature, associated with lightheadedness and feeling of pre-syncope on occassions, and also present at night. The headache is not brought on by any specific activity but seems to start positionally when the patient tries to lie down and rest her head against a pillow. It is relieved somewhat by sitting still. The patient has not been able to sleep because of the headache for many nights because she is sitting up in bed all night long. Her last bout of headache was at 4:30 am this morning. At this moment, the patient does not have the headache.   The headache is not associated with jaw claudication, new vision changes (the patient has chronic visual problems due to possibly developing cataracts in her eyes), and no scalp pain.  There is no history of recent trauma. She had her last fall 1 year ago. She have been no medication changes recently. She admits to ear pain with the headache, but denies any ear discharge. She denies recent infection/viral URI. She denies any fever, chills, neck stiffness, photophobia, back pain or neck pain, no nosebleeds. The patient denies any slurring of speech or confusion. She has not experienced any extremity weakness. The patient has not experienced this kind of headache ever in her life.    Review of Systems  Constitutional: Negative for fever, chills, diaphoresis, activity change, appetite change, fatigue and unexpected weight change.  HENT: Positive for ear pain. Negative for dental problem, ear discharge, nosebleeds, rhinorrhea, sore throat, tinnitus, trouble swallowing and voice change.   Eyes: Positive for visual disturbance (chronic). Negative for photophobia and redness.  Respiratory: Negative for cough, chest tightness and shortness of breath.   Cardiovascular: Negative for chest pain and palpitations.  Gastrointestinal: Negative for nausea and vomiting.  Musculoskeletal: Negative for back pain, gait problem, neck pain and neck stiffness.  Skin: Negative for rash.  Allergic/Immunologic: Negative.   Neurological: Positive for dizziness and headaches. Negative for tremors, seizures, syncope, facial asymmetry, speech difficulty, weakness, light-headedness and numbness.  Hematological: Negative for adenopathy.  Psychiatric/Behavioral: Negative.        Objective:   Physical Exam  Constitutional: She is oriented to person, place, and time. No distress.  Thin built african american lady  HENT:  Head: Normocephalic and atraumatic.  Right Ear: External ear normal.  Left Ear: External ear normal.  Nose: Nose normal.  Mouth/Throat: Oropharynx is clear and moist. No oropharyngeal exudate.  No scalp tenderness.   Eyes: Conjunctivae and EOM are normal. Pupils are equal, round, and reactive to light. Right eye exhibits no discharge. Left eye exhibits no discharge.  Lenses hazy bilaterally  Neck: Normal range of motion. Neck supple.  Cardiovascular: Normal rate, regular rhythm, normal heart sounds and intact distal pulses.   No murmur heard. Pulmonary/Chest: Effort normal and breath sounds normal. No respiratory distress. She has no wheezes. She  has no rales. She exhibits no tenderness.  Abdominal: Soft. Bowel sounds are normal.  Lymphadenopathy:    She has no cervical  adenopathy.  Neurological: She is alert and oriented to person, place, and time. She has normal reflexes. She displays normal reflexes. No cranial nerve deficit. She exhibits normal muscle tone. Coordination normal.  Skin: Skin is warm. She is not diaphoretic.  Psychiatric: She has a normal mood and affect. Her behavior is normal. Judgment and thought content normal.       Assessment:     Headache in the setting of multiple myeloma -  This headache has a couple of red flag symptoms: New sudden onset on severe headache never experienced by the patient before, positional nature of the headache, underlying multiple myeloma, the presence of a recent lytic lesion in skull at the same area as the headache (Bone Survey 01/09/13), association with dizziness and lightheadedness. The differential diagnosis could be very broad, and include intracranial neoplasm, headache related to lytic lesion in the skull, hyperviscosity syndrome in multiple myeloma (she does not have any other symptoms related to it), migraine (unlikely given her age), temporal arteritis (no typical symptoms), paroxysmal hemicranias, cluster headache (no autonomic symptoms), hypertension (normal blood pressure today). However, I would first rule out any intracranial involvement before I start evaluating her for other reasons of headache. At this time, because it is 5 pm and Friday, I will send her to the ER to get the CT scan of the head with contrast (no CKD). I will refrain from prescribing NSAIDs and ask her to hold on Aspirin until she gets the results of the CT scan. We will also do CBC, CRP and ESR to rule out hyperviscosity and temporal arteritis. If the CT scan is negative, the patient can resume Aspirin and try NSAIDs for her headache. We also contacted Dr Alver Fisher office and they would look at the CT scan and schedule an early appointment for her accordingly. Her current appointment is on 04/18/13.   If the CT scan is negative and the  patient's headaches continue, she might need neurology evaluation.

## 2013-04-06 NOTE — ED Provider Notes (Signed)
CSN: [id number]     Arrival date & time 04/06/13  1705 History   First MD Initiated Contact with Patient 04/06/13 1843     Chief Complaint  Patient presents with  . Headache  . Arm Pain  . Weakness   (Consider location/radiation/quality/duration/timing/severity/associated sxs/prior Treatment) Patient is a 79 y.o. female presenting with headaches, arm pain, and weakness.  Headache Arm Pain Associated symptoms include headaches and weakness.  Weakness Associated symptoms include headaches and weakness.    Patient is an 79 yo female with a history of multiple myeloma and HTN who presents from her PCP office for evaluation of headache. States started on Sunday. Located in the left temporal region and left ear. Was constant to start with, but became intermittent 2 days ago. Onset was following left arm pain. She now describes pain as sharp sudden onset left temporal pain. Not currently in any pain. States has never had a headache like this. She denies jaw pain, vision changes, scalp pain, fevers, neck stiffness, slurred speech, extremity weakness, chest pain, and shortness of breath.  Past Medical History  Diagnosis Date  . Multiple myeloma without mention of remission   . Monoclonal paraproteinemia   . Hypertension    Past Surgical History  Procedure Laterality Date  . Multiple mye     History reviewed. No pertinent family history. History  Substance Use Topics  . Smoking status: Former Games developer  . Smokeless tobacco: Not on file  . Alcohol Use: No   OB History   Grav Para Term Preterm Abortions TAB SAB Ect Mult Living                 Review of Systems  Neurological: Positive for weakness and headaches.  See HPI.  Allergies  Morphine and related  Home Medications   Current Outpatient Rx  Name  Route  Sig  Dispense  Refill  . amLODipine (NORVASC) 5 MG tablet   Oral   Take 1 tablet (5 mg total) by mouth daily.   30 tablet   5   . aspirin 81 MG tablet   Oral   Take  1 tablet (81 mg total) by mouth daily.   31 tablet   11   . Calcium Carbonate-Vitamin D (CALCIUM 600+D) 600-400 MG-UNIT per tablet   Oral   Take 1 tablet by mouth daily.   30 tablet   3   . hydrochlorothiazide (MICROZIDE) 12.5 MG capsule   Oral   Take 1 capsule (12.5 mg total) by mouth daily.   30 capsule   3   . hydrocortisone cream 1 %   Topical   Apply topically 2 (two) times daily.   30 g   0   . lisinopril (PRINIVIL,ZESTRIL) 40 MG tablet   Oral   Take 1 tablet (40 mg total) by mouth daily.   90 tablet   3   . Loperamide-Simethicone (IMODIUM ADVANCED) 2-125 MG TABS      Take 1 tablet 2 - 3 times per day as needed for diarrhea          . sertraline (ZOLOFT) 25 MG tablet   Oral   Take 1 tablet (25 mg total) by mouth daily.   30 tablet   3   . simvastatin (ZOCOR) 20 MG tablet   Oral   Take 1 tablet (20 mg total) by mouth at bedtime.   31 tablet   5   . nitroGLYCERIN (NITROSTAT) 0.4 MG SL tablet   Sublingual  Place 1 tablet (0.4 mg total) under the tongue every 5 (five) minutes as needed for chest pain.   90 tablet   5    BP 118/70  Pulse 69  Temp(Src) 97.9 F (36.6 C) (Oral)  Resp 16  Wt 132 lb (59.875 kg)  BMI 21.97 kg/m2  SpO2 100% Physical Exam  Constitutional: She appears well-developed.  Thin appearing  HENT:  Head: Normocephalic and atraumatic.  Mouth/Throat: Oropharynx is clear and moist.  Eyes: Pupils are equal, round, and reactive to light.  Neck: Neck supple.  Mild tenderness to palpation over left lateral neck  Cardiovascular: Normal rate, regular rhythm and normal heart sounds.   Pulmonary/Chest: Effort normal and breath sounds normal.  Abdominal: Soft. Bowel sounds are normal. She exhibits no distension. There is no tenderness.  Musculoskeletal: She exhibits no edema.  Neurological: She is alert.  5/5 strength in bilateral upper and lower extremities, sensation to light touch intact in bilateral upper and lower extremities,  patellar reflexes 2+, visual fields intact, EOMI, CN 5, 7-12 intact    ED Course  Procedures (including critical care time) Labs Review Labs Reviewed  BASIC METABOLIC PANEL - Abnormal; Notable for the following:    GFR calc non Af Amer 65 (*)    GFR calc Af Amer 76 (*)    All other components within normal limits  CBC  POCT I-STAT TROPONIN I   Imaging Review Ct Head W Wo Contrast  04/06/2013   CLINICAL DATA:  Headache, mild pain radiates into the left arm.  EXAM: CT HEAD WITHOUT AND WITH CONTRAST  TECHNIQUE: Contiguous axial images were obtained from the base of the skull through the vertex without and with intravenous contrast  CONTRAST:  OMNIPAQUE IOHEXOL 300 MG/ML  SOLN  COMPARISON:  Head CT 01/25/2009  FINDINGS: Non IV contrast images demonstrate no intracranial hemorrhage. No CT evidence of cortical infarction. No midline shift or mass effect. No hydrocephalus. Minimal cortical atrophy or patient's age. Minimal white matter disease.  Contrast enhanced imaging demonstrates no abnormal enhancing lesion within the brain parenchyma. No abnormal enhancement within the ventricles or meningeal surfaces. The basilar circulation is grossly normal.  Paranasal sinuses and mastoid air cells are clear.  IMPRESSION: 1. No acute intracranial findings on noncontrast and contrast-enhanced CT.  2.  No change from prior.   Electronically Signed   By: Genevive Bi M.D.   On: 04/06/2013 22:54    EKG Interpretation   None       MDM   1. Headache    7:10 pm: patient seen and examined. Patient with acute onset left sided headache on Sunday. States is the worst headache she has ever had. Noted to be sharp and was constant initially and now is intermittent. Notes was unable to lay on left side of face. Differential includes trigeminal neuralgia, temporal arteritis, headache related to multiple myeloma lytic lesion, metastasis, subarachnoid hemorrhage, dissection. Patient without neurological  symptoms making dissection unlikely, and making SAH and metastasis less likely. Will obtain CT head with/without contrast. Patient not in pain at this time. Will treat pain if this recurs. Will check CBC, BMET, and istat troponin.  8:30 pm: patient continues to do well. No headache at this time. Awaiting CT head.  11:00 pm: CT head resulted with no intracranial abnormalities. Discussed this with the patient. Patient without headache at this time. Headache potentially related to a trigeminal neuralgia vs temporal arteritis. No evidence of metastasis or lytic lesions or bleed noted on CT  head read. Will have patient follow-up with PCP for further management of headache. Given return precautions.  This patient was discussed with and seen by my attending Dr Patria Mane.  Marikay Alar, MD [facility] PGY-2 04/06/13 11:18 pm  Glori Luis, MD 04/07/13 [phone]

## 2013-04-06 NOTE — ED Notes (Signed)
Dr. Tomma Rakers at bedside.

## 2013-04-07 NOTE — ED Provider Notes (Signed)
I saw and evaluated the patient, reviewed the resident's note and I agree with the findings and plan.  Ct head normal. No HA at this time  Lyanne Co, MD 04/07/13 [phone]

## 2013-04-18 ENCOUNTER — Other Ambulatory Visit (HOSPITAL_BASED_OUTPATIENT_CLINIC_OR_DEPARTMENT_OTHER): Payer: Medicare Other | Admitting: Lab

## 2013-04-18 ENCOUNTER — Telehealth: Payer: Self-pay | Admitting: *Deleted

## 2013-04-18 ENCOUNTER — Ambulatory Visit (HOSPITAL_BASED_OUTPATIENT_CLINIC_OR_DEPARTMENT_OTHER): Payer: Medicare Other | Admitting: Oncology

## 2013-04-18 VITALS — BP 126/68 | HR 56 | Temp 97.2°F | Resp 18 | Wt 128.4 lb

## 2013-04-18 DIAGNOSIS — C9000 Multiple myeloma not having achieved remission: Secondary | ICD-10-CM

## 2013-04-18 LAB — CBC WITH DIFFERENTIAL/PLATELET
BASO%: 0.4 % (ref 0.0–2.0)
Basophils Absolute: 0 10*3/uL (ref 0.0–0.1)
HCT: 35.4 % (ref 34.8–46.6)
LYMPH%: 15.2 % (ref 14.0–49.7)
MCH: 31.6 pg (ref 25.1–34.0)
MCHC: 33.3 g/dL (ref 31.5–36.0)
MCV: 94.8 fL (ref 79.5–101.0)
MONO#: 0.5 10*3/uL (ref 0.1–0.9)
MONO%: 5.5 % (ref 0.0–14.0)
NEUT%: 78 % — ABNORMAL HIGH (ref 38.4–76.8)
Platelets: 211 10*3/uL (ref 145–400)
RBC: 3.74 10*6/uL (ref 3.70–5.45)
WBC: 8.6 10*3/uL (ref 3.9–10.3)

## 2013-04-18 LAB — COMPREHENSIVE METABOLIC PANEL (CC13)
ALT: 11 U/L (ref 0–55)
Albumin: 3.5 g/dL (ref 3.5–5.0)
Alkaline Phosphatase: 89 U/L (ref 40–150)
Anion Gap: 11 mEq/L (ref 3–11)
CO2: 21 mEq/L — ABNORMAL LOW (ref 22–29)
Calcium: 9.3 mg/dL (ref 8.4–10.4)
Creatinine: 1.6 mg/dL — ABNORMAL HIGH (ref 0.6–1.1)
Potassium: 4.2 mEq/L (ref 3.5–5.1)
Sodium: 140 mEq/L (ref 136–145)
Total Bilirubin: 0.63 mg/dL (ref 0.20–1.20)
Total Protein: 7 g/dL (ref 6.4–8.3)

## 2013-04-18 NOTE — Progress Notes (Signed)
Hematology and Oncology Follow Up Visit  Kayla Brooks [id number] 1929/07/27 79 y.o. 04/18/2013 3:45 PM  CC: Kayla Showers, MD  Kayla Sans. Daleen Squibb, MD, [facility]  Radene Gunning, M.D., Ph.D.    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  She had a light chain multiple myeloma with M spike of about 0.35 gm/dL.  Prior Therapy: 1. Status post prophylactic fixation impending pathological fracture of the right femur done in August 2009. 2. External beam radiation concluded in 2010 at the right femur. 3. Revlimid 25 mg daily as maintenance therapy for 2 years ending 03/2012  Current therapy:  Watchful observation.  Interim History:  [name] presents today for a followup visit. She is  an 79 year old female, as mentioned, who had initially presented with plasmacytoma and found to have evidence of light chain myeloma involving the bone marrow. She received Revlimid with reasonable control of her disease. This was stopped in October 2013 due to diarrhea. She had not had any nausea, not had any abdominal distension.  For the most part, performance status and activity level continue to be at baseline.  She had not had any back pain.  Had not had any pathological fractures.  Had not had any falls at this time. Her appetite is still good.  She did have an episode of headaches that have since resolved. Her workup has been unrevealing including a CT scan of the brain.  Medications: I have reviewed the patient's current medications.  Current Outpatient Prescriptions  Medication Sig Dispense Refill  . amLODipine (NORVASC) 5 MG tablet Take 1 tablet (5 mg total) by mouth daily.  30 tablet  5  . aspirin 81 MG tablet Take 1 tablet (81 mg total) by mouth daily.  31 tablet  11  . Calcium Carbonate-Vitamin D (CALCIUM 600+D) 600-400 MG-UNIT per tablet Take 1 tablet by mouth  daily.  30 tablet  3  . hydrochlorothiazide (MICROZIDE) 12.5 MG capsule Take 1 capsule (12.5 mg total) by mouth daily.  30 capsule  3  . hydrocortisone cream 1 % Apply topically 2 (two) times daily.  30 g  0  . lisinopril (PRINIVIL,ZESTRIL) 40 MG tablet Take 1 tablet (40 mg total) by mouth daily.  90 tablet  3  . Loperamide-Simethicone (IMODIUM ADVANCED) 2-125 MG TABS Take 1 tablet 2 - 3 times per day as needed for diarrhea       . nitroGLYCERIN (NITROSTAT) 0.4 MG SL tablet Place 1 tablet (0.4 mg total) under the tongue every 5 (five) minutes as needed for chest pain.  90 tablet  5  . simvastatin (ZOCOR) 20 MG tablet Take 1 tablet (20 mg total) by mouth at bedtime.  31 tablet  5   No current facility-administered medications for this visit.  , Allergies:  Allergies  Allergen Reactions  . Morphine And Related     Abdominal Pain    Past Medical History, Surgical history, Social history, and Family History were reviewed and updated.  Review of Systems:  Remaining ROS negative.  Physical Exam: There were no vitals taken for this visit. ECOG: 1 General appearance: alert Head: Normocephalic, without obvious abnormality, atraumatic Neck: no adenopathy, no carotid bruit, no JVD, supple, symmetrical, trachea midline and thyroid not enlarged, symmetric, no tenderness/mass/nodules Lymph nodes: Cervical, supraclavicular, and axillary nodes normal. Heart:regular rate and rhythm, S1, S2 normal, no murmur, click, rub  or gallop Lung:chest clear, no wheezing, rales, normal symmetric air entry Abdomen: soft, non-tender, without masses or organomegaly EXT:no evidence of joint effusion Skin: No rash or desquamation.   Lab Results: Lab Results  Component Value Date   WBC 8.6 04/18/2013   HGB 11.8 04/18/2013   HCT 35.4 04/18/2013   MCV 94.8 04/18/2013   PLT 211 04/18/2013   Impression and Plan:  A 79 year old female with the following issues. 1. History of a plasmacytoma of the right femur.   She is status post surgical fixation followed by external beam radiation, doing very well at this time.  No evidence of any changes. 2. 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  She has been off Revlimid since 03/2012. Her kappa free light chain is under reasonable control. Skeletal survey repeated in 12/2012 and shows no major changes. Her symptoms are better now. We plan to keep her off Revlimid and restart it with progression.  3. Bony disease.  Continue to be on calcium and vitamin D supplements.  I will hold off on bisphosphonate for the time being. 4. Follow up in 3 months     SHADAD,FIRAS 10/29/20143:45 PM

## 2013-04-18 NOTE — Telephone Encounter (Signed)
appts made and printed...td 

## 2013-04-19 LAB — KAPPA/LAMBDA LIGHT CHAINS
Kappa free light chain: 47.2 mg/dL — ABNORMAL HIGH (ref 0.33–1.94)
Kappa:Lambda Ratio: 28.43 — ABNORMAL HIGH (ref 0.26–1.65)

## 2013-07-19 ENCOUNTER — Encounter: Payer: Self-pay | Admitting: Oncology

## 2013-07-19 ENCOUNTER — Telehealth: Payer: Self-pay | Admitting: Oncology

## 2013-07-19 ENCOUNTER — Ambulatory Visit (HOSPITAL_BASED_OUTPATIENT_CLINIC_OR_DEPARTMENT_OTHER): Payer: Medicare Other | Admitting: Oncology

## 2013-07-19 ENCOUNTER — Other Ambulatory Visit (HOSPITAL_BASED_OUTPATIENT_CLINIC_OR_DEPARTMENT_OTHER): Payer: Medicare Other

## 2013-07-19 VITALS — BP 153/72 | HR 61 | Temp 98.0°F | Resp 18 | Ht 65.0 in | Wt 131.7 lb

## 2013-07-19 DIAGNOSIS — M899 Disorder of bone, unspecified: Secondary | ICD-10-CM

## 2013-07-19 DIAGNOSIS — C9000 Multiple myeloma not having achieved remission: Secondary | ICD-10-CM

## 2013-07-19 DIAGNOSIS — M949 Disorder of cartilage, unspecified: Secondary | ICD-10-CM | POA: Diagnosis not present

## 2013-07-19 LAB — CBC WITH DIFFERENTIAL/PLATELET
BASO%: 0.5 % (ref 0.0–2.0)
Basophils Absolute: 0 10*3/uL (ref 0.0–0.1)
EOS%: 0.8 % (ref 0.0–7.0)
Eosinophils Absolute: 0.1 10*3/uL (ref 0.0–0.5)
HEMATOCRIT: 35.9 % (ref 34.8–46.6)
HGB: 11.8 g/dL (ref 11.6–15.9)
LYMPH%: 25.6 % (ref 14.0–49.7)
MCH: 31.3 pg (ref 25.1–34.0)
MCHC: 32.8 g/dL (ref 31.5–36.0)
MCV: 95.4 fL (ref 79.5–101.0)
MONO#: 0.4 10*3/uL (ref 0.1–0.9)
MONO%: 7.3 % (ref 0.0–14.0)
NEUT#: 4 10*3/uL (ref 1.5–6.5)
NEUT%: 65.8 % (ref 38.4–76.8)
Platelets: 191 10*3/uL (ref 145–400)
RBC: 3.76 10*6/uL (ref 3.70–5.45)
RDW: 12.7 % (ref 11.2–14.5)
WBC: 6 10*3/uL (ref 3.9–10.3)
lymph#: 1.5 10*3/uL (ref 0.9–3.3)

## 2013-07-19 LAB — COMPREHENSIVE METABOLIC PANEL (CC13)
ALK PHOS: 110 U/L (ref 40–150)
ALT: 14 U/L (ref 0–55)
AST: 22 U/L (ref 5–34)
Albumin: 3.7 g/dL (ref 3.5–5.0)
Anion Gap: 11 mEq/L (ref 3–11)
BILIRUBIN TOTAL: 0.76 mg/dL (ref 0.20–1.20)
BUN: 17.5 mg/dL (ref 7.0–26.0)
CO2: 26 mEq/L (ref 22–29)
CREATININE: 0.8 mg/dL (ref 0.6–1.1)
Calcium: 9.2 mg/dL (ref 8.4–10.4)
Chloride: 105 mEq/L (ref 98–109)
Glucose: 98 mg/dl (ref 70–140)
Potassium: 3.5 mEq/L (ref 3.5–5.1)
Sodium: 143 mEq/L (ref 136–145)
Total Protein: 6.8 g/dL (ref 6.4–8.3)

## 2013-07-19 NOTE — Progress Notes (Signed)
Hematology and Oncology Follow Up Visit  Kayla Brooks [id number] 06-02-1930 80 y.o. 07/19/2013 3:41 PM  CC: Kayla Ser, MD  Kayla Brooks. Kayla Blalock, MD, [facility]  Kayla Brooks, M.D., Ph.D.    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  She had a light chain multiple myeloma with M spike of about 0.35 gm/dL.  Prior Therapy: 1. Status post prophylactic fixation impending pathological fracture of the right femur done in August 2009. 2. External beam radiation concluded in 2010 at the right femur. 3. Revlimid 25 mg daily as maintenance therapy for 2 years ending 03/2012  Current therapy:  Watchful observation.  Interim History:  [name] presents today for a followup visit. She is  an 80 year old female, as mentioned, who had initially presented with plasmacytoma and found to have evidence of light chain myeloma involving the bone marrow. She received Revlimid with reasonable control of her disease. This was stopped in October 2013 due to diarrhea. She had not had any nausea, not had any abdominal distension.  For the most part, performance status and activity level continue to be at baseline.  She had not had any back pain.  Had not had any pathological fractures.  Had not had any falls at this time. Her appetite is still good. She reports no recent hospitalizations or illnesses. Has not reported any recurrent sinopulmonary infections.   Medications: I have reviewed the patient's current medications.  Current Outpatient Prescriptions  Medication Sig Dispense Refill  . amLODipine (NORVASC) 5 MG tablet Take 1 tablet (5 mg total) by mouth daily.  30 tablet  5  . aspirin 81 MG tablet Take 1 tablet (81 mg total) by mouth daily.  31 tablet  11  . Calcium Carbonate-Vitamin D (CALCIUM 600+D) 600-400 MG-UNIT per tablet Take 1 tablet by mouth daily.  30 tablet   3  . hydrochlorothiazide (MICROZIDE) 12.5 MG capsule Take 1 capsule (12.5 mg total) by mouth daily.  30 capsule  3  . hydrocortisone cream 1 % Apply topically 2 (two) times daily.  30 g  0  . lisinopril (PRINIVIL,ZESTRIL) 40 MG tablet Take 1 tablet (40 mg total) by mouth daily.  90 tablet  3  . Loperamide-Simethicone (IMODIUM ADVANCED) 2-125 MG TABS Take 1 tablet 2 - 3 times per day as needed for diarrhea       . nitroGLYCERIN (NITROSTAT) 0.4 MG SL tablet Place 1 tablet (0.4 mg total) under the tongue every 5 (five) minutes as needed for chest pain.  90 tablet  5  . simvastatin (ZOCOR) 20 MG tablet Take 1 tablet (20 mg total) by mouth at bedtime.  31 tablet  5   No current facility-administered medications for this visit.  , Allergies:  Allergies  Allergen Reactions  . Morphine And Related     Abdominal Pain    Past Medical History, Surgical history, Social history, and Family History were reviewed and updated.  Review of Systems:  Remaining ROS negative.  Physical Exam: Blood pressure 153/72, pulse 61, temperature 98 F (36.7 C), temperature source Oral, resp. rate 18, height _0  (1.651 m), weight 131 lb 11.2 oz (59.739 kg), SpO2 98.00%. ECOG: 1 General appearance: alert Head: Normocephalic, without obvious abnormality, atraumatic Neck: no adenopathy, no carotid bruit, no JVD, supple, symmetrical, trachea midline and thyroid not enlarged, symmetric, no tenderness/mass/nodules Lymph nodes: Cervical, supraclavicular, and axillary  nodes normal. Heart:regular rate and rhythm, S1, S2 normal, no murmur, click, rub or gallop Lung:chest clear, no wheezing, rales, normal symmetric air entry Abdomen: soft, non-tender, without masses or organomegaly EXT:no evidence of joint effusion Skin: No rash or desquamation.   Lab Results: Lab Results  Component Value Date   WBC 6.0 07/19/2013   HGB 11.8 07/19/2013   HCT 35.9 07/19/2013   MCV 95.4 07/19/2013   PLT 191 07/19/2013   Impression and  Plan:  A 80 year old female with the following issues. 1. History of a plasmacytoma of the right femur.  She is status post surgical fixation followed by external beam radiation, doing very well at this time.  No evidence of any changes. 2. 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  She has been off Revlimid since 03/2012. Her kappa free light chain is under reasonable control. Skeletal survey repeated in 12/2012 and shows no major changes. Her symptoms are better now. We plan to keep her off Revlimid and restart it with progression. I will repeat her workup in 3 months including a skeletal survey. 3. Bony disease.  Continue to be on calcium and vitamin D supplements.  I will hold off on bisphosphonate for the time being. 4. Follow up in 3 months     Kayla Brooks 1/29/20153:41 PM

## 2013-07-19 NOTE — Telephone Encounter (Signed)
gv adn printed appt scehd and avs for pt for April

## 2013-07-20 LAB — KAPPA/LAMBDA LIGHT CHAINS
KAPPA FREE LGHT CHN: 38.6 mg/dL — AB (ref 0.33–1.94)
Kappa:Lambda Ratio: 31.38 — ABNORMAL HIGH (ref 0.26–1.65)
LAMBDA FREE LGHT CHN: 1.23 mg/dL (ref 0.57–2.63)

## 2013-07-31 DIAGNOSIS — M25559 Pain in unspecified hip: Secondary | ICD-10-CM | POA: Diagnosis not present

## 2013-07-31 DIAGNOSIS — M1610 Unilateral primary osteoarthritis, unspecified hip: Secondary | ICD-10-CM | POA: Diagnosis not present

## 2013-07-31 DIAGNOSIS — C9030 Solitary plasmacytoma not having achieved remission: Secondary | ICD-10-CM | POA: Diagnosis not present

## 2013-07-31 DIAGNOSIS — M169 Osteoarthritis of hip, unspecified: Secondary | ICD-10-CM | POA: Diagnosis not present

## 2013-09-20 ENCOUNTER — Other Ambulatory Visit: Payer: Self-pay | Admitting: *Deleted

## 2013-09-20 DIAGNOSIS — I10 Essential (primary) hypertension: Secondary | ICD-10-CM

## 2013-09-20 MED ORDER — LISINOPRIL 40 MG PO TABS: 40.0000 mg | ORAL_TABLET | Freq: Every day | ORAL | Status: DC

## 2013-09-20 NOTE — Telephone Encounter (Signed)
Pt is out of meds, please refill 

## 2013-10-09 ENCOUNTER — Other Ambulatory Visit: Payer: Self-pay | Admitting: *Deleted

## 2013-10-09 DIAGNOSIS — I10 Essential (primary) hypertension: Secondary | ICD-10-CM

## 2013-10-10 MED ORDER — AMLODIPINE BESYLATE 5 MG PO TABS: 5.0000 mg | ORAL_TABLET | Freq: Every day | ORAL | Status: DC

## 2013-10-10 MED ORDER — SIMVASTATIN 20 MG PO TABS: 20.0000 mg | ORAL_TABLET | Freq: Every day | ORAL | Status: DC

## 2013-10-10 NOTE — Telephone Encounter (Signed)
Patient needs to schedule an appointment with [place].

## 2013-10-16 ENCOUNTER — Other Ambulatory Visit: Payer: Medicare Other

## 2013-10-16 ENCOUNTER — Ambulatory Visit (HOSPITAL_COMMUNITY): Admission: RE | Admit: 2013-10-16 | Payer: Medicare Other | Source: Ambulatory Visit

## 2013-10-18 ENCOUNTER — Ambulatory Visit (HOSPITAL_BASED_OUTPATIENT_CLINIC_OR_DEPARTMENT_OTHER): Payer: Medicare Other | Admitting: Oncology

## 2013-10-18 ENCOUNTER — Ambulatory Visit (HOSPITAL_COMMUNITY)
Admission: RE | Admit: 2013-10-18 | Discharge: 2013-10-18 | Disposition: A | Discharge status: Home / Self Care | Payer: Medicare Other | Source: Ambulatory Visit | Attending: Oncology | Admitting: Oncology

## 2013-10-18 ENCOUNTER — Encounter: Payer: Self-pay | Admitting: Oncology

## 2013-10-18 ENCOUNTER — Telehealth: Payer: Self-pay | Admitting: Oncology

## 2013-10-18 ENCOUNTER — Ambulatory Visit (HOSPITAL_BASED_OUTPATIENT_CLINIC_OR_DEPARTMENT_OTHER): Payer: Medicare Other

## 2013-10-18 VITALS — BP 141/60 | HR 64 | Temp 98.2°F | Resp 18 | Ht 65.0 in | Wt 129.9 lb

## 2013-10-18 DIAGNOSIS — C9000 Multiple myeloma not having achieved remission: Secondary | ICD-10-CM

## 2013-10-18 DIAGNOSIS — M949 Disorder of cartilage, unspecified: Secondary | ICD-10-CM

## 2013-10-18 DIAGNOSIS — M899 Disorder of bone, unspecified: Secondary | ICD-10-CM

## 2013-10-18 DIAGNOSIS — R109 Unspecified abdominal pain: Secondary | ICD-10-CM | POA: Diagnosis not present

## 2013-10-18 LAB — COMPREHENSIVE METABOLIC PANEL (CC13)
ALT: 8 U/L (ref 0–55)
AST: 17 U/L (ref 5–34)
Albumin: 3.6 g/dL (ref 3.5–5.0)
Alkaline Phosphatase: 96 U/L (ref 40–150)
Anion Gap: 10 mEq/L (ref 3–11)
BUN: 21.4 mg/dL (ref 7.0–26.0)
CALCIUM: 9.5 mg/dL (ref 8.4–10.4)
CHLORIDE: 110 meq/L — AB (ref 98–109)
CO2: 25 mEq/L (ref 22–29)
Creatinine: 0.9 mg/dL (ref 0.6–1.1)
GLUCOSE: 96 mg/dL (ref 70–140)
Potassium: 3.4 mEq/L — ABNORMAL LOW (ref 3.5–5.1)
SODIUM: 145 meq/L (ref 136–145)
Total Bilirubin: 0.98 mg/dL (ref 0.20–1.20)
Total Protein: 6.7 g/dL (ref 6.4–8.3)

## 2013-10-18 LAB — CBC WITH DIFFERENTIAL/PLATELET
BASO%: 0.1 % (ref 0.0–2.0)
Basophils Absolute: 0 10*3/uL (ref 0.0–0.1)
EOS ABS: 0.1 10*3/uL (ref 0.0–0.5)
EOS%: 1.2 % (ref 0.0–7.0)
HCT: 35.5 % (ref 34.8–46.6)
HEMOGLOBIN: 11.5 g/dL — AB (ref 11.6–15.9)
LYMPH#: 1.4 10*3/uL (ref 0.9–3.3)
LYMPH%: 26.4 % (ref 14.0–49.7)
MCH: 30.9 pg (ref 25.1–34.0)
MCHC: 32.5 g/dL (ref 31.5–36.0)
MCV: 95.1 fL (ref 79.5–101.0)
MONO#: 0.4 10*3/uL (ref 0.1–0.9)
MONO%: 7.1 % (ref 0.0–14.0)
NEUT%: 65.2 % (ref 38.4–76.8)
NEUTROS ABS: 3.4 10*3/uL (ref 1.5–6.5)
Platelets: 184 10*3/uL (ref 145–400)
RBC: 3.73 10*6/uL (ref 3.70–5.45)
RDW: 13.2 % (ref 11.2–14.5)
WBC: 5.3 10*3/uL (ref 3.9–10.3)

## 2013-10-18 NOTE — Progress Notes (Signed)
Hematology and Oncology Follow Up Visit  Kayla Brooks [id number] 05/30/1930 80 y.o. 10/18/2013 3:46 PM  CC: Kayla Ser, MD  Kayla Brooks. Kayla Blalock, MD, [name]  Kayla Brooks, M.D., Ph.D.    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  She had a light chain multiple myeloma with M spike of about 0.35 gm/dL.  Prior Therapy: 1. Status post prophylactic fixation impending pathological fracture of the right femur done in August 2009. 2. External beam radiation concluded in 2010 at the right femur. 3. Revlimid 25 mg daily as maintenance therapy for 2 years ending 03/2012  Current therapy:  Watchful observation.  Interim History:  Kayla Brooks presents today for a followup visit. She is  an 80 year old female, as mentioned, who had initially presented with plasmacytoma and found to have evidence of light chain myeloma involving the bone marrow. She received Revlimid with reasonable control of her disease. This was stopped in October 2013 due to diarrhea. Since her last visit, she has been doing very well She had not had any nausea, not had any abdominal distension.  For the most part, performance status and activity level continue to be at baseline.  She had not had any back pain.  Had not had any pathological fractures.  Had not had any falls at this time. Her appetite is still good. She reports no recent hospitalizations or illnesses. Has not reported any recurrent sinopulmonary infections.  She has reported that occasion right hip pain and right leg pain. She has not had any falls or fractures.   Medications: I have reviewed the patient's current medications.  Current Outpatient Prescriptions  Medication Sig Dispense Refill  . amLODipine (NORVASC) 5 MG tablet Take 1 tablet (5 mg total) by mouth daily.  30 tablet  5  . aspirin 81 MG tablet Take 1 tablet (81  mg total) by mouth daily.  31 tablet  11  . Calcium Carbonate-Vitamin D (CALCIUM 600+D) 600-400 MG-UNIT per tablet Take 1 tablet by mouth daily.  30 tablet  3  . hydrochlorothiazide (MICROZIDE) 12.5 MG capsule Take 1 capsule (12.5 mg total) by mouth daily.  30 capsule  3  . hydrocortisone cream 1 % Apply topically 2 (two) times daily.  30 g  0  . lisinopril (PRINIVIL,ZESTRIL) 40 MG tablet Take 1 tablet (40 mg total) by mouth daily.  90 tablet  3  . Loperamide-Simethicone (IMODIUM ADVANCED) 2-125 MG TABS Take 1 tablet 2 - 3 times per day as needed for diarrhea       . nitroGLYCERIN (NITROSTAT) 0.4 MG SL tablet Place 1 tablet (0.4 mg total) under the tongue every 5 (five) minutes as needed for chest pain.  90 tablet  5  . simvastatin (ZOCOR) 20 MG tablet Take 1 tablet (20 mg total) by mouth at bedtime.  31 tablet  5   No current facility-administered medications for this visit.  , Allergies:  Allergies  Allergen Reactions  . Morphine And Related     Abdominal Pain    Past Medical History, Surgical history, Social history, and Family History were reviewed and updated.  Review of Systems:  Remaining ROS negative.  Physical Exam: Blood pressure 141/60, pulse 64, temperature 98.2 F (36.8 C), temperature source Oral, resp. rate 18, height '5\' 5"'  (1.651 m), weight 129 lb 14.4 oz (58.922 kg). ECOG: 1 General appearance: alert Head: Normocephalic,  without obvious abnormality, atraumatic Neck: no adenopathy, no carotid bruit, no JVD, supple, symmetrical, trachea midline and thyroid not enlarged, symmetric, no tenderness/mass/nodules Lymph nodes: Cervical, supraclavicular, and axillary nodes normal. Heart:regular rate and rhythm, S1, S2 normal, no murmur, click, rub or gallop Lung:chest clear, no wheezing, rales, normal symmetric air entry Abdomen: soft, non-tender, without masses or organomegaly EXT:no evidence of joint effusion Skin: No rash or desquamation.   Lab Results: Lab Results   Component Value Date   WBC 6.0 07/19/2013   HGB 11.8 07/19/2013   HCT 35.9 07/19/2013   MCV 95.4 07/19/2013   PLT 191 07/19/2013   Impression and Plan:  A 80 year old female with the following issues. 1. History of a plasmacytoma of the right femur.  She is status post surgical fixation followed by external beam radiation, doing very well at this time.  She missed her appointment for laboratory testing and plain film x-rays. I plan on obtaining the studies today and I will discuss that with her over the phone in the next week if there is any abnormalities. 2. 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  She has been off Revlimid since 03/2012. Her kappa free light chain is under reasonable control. Skeletal survey repeated in 12/2012 and shows no major changes. Her symptoms are better now.She missed her appointment for laboratory testing and plain film x-rays. I plan on obtaining the studies today and I will discuss that with her over the phone in the next week if there is any abnormalities.  3. Bony disease.  Continue to be on calcium and vitamin D supplements.  I will hold off on bisphosphonate for the time being. 4. Follow up in 3 months     Kayla Brooks 4/30/20153:46 PM

## 2013-10-18 NOTE — Telephone Encounter (Signed)
gv pt appt schedule may/june including port placement for 5/4. pt aware to arrive @ 12pm.

## 2013-10-19 ENCOUNTER — Telehealth: Payer: Self-pay | Admitting: Medical Oncology

## 2013-10-19 NOTE — Telephone Encounter (Signed)
Message copied by Maxwell Johnnay on Fri Oct 19, 2013 11:50 AM ------      Message from: Wyatt Portela      Created: Fri Oct 19, 2013  8:15 AM       Please call her xray results. All good no cancer. ------

## 2013-10-19 NOTE — Telephone Encounter (Signed)
Per MD, informed patient of xray results. Patient expressed thanks and relief. Denies questions.

## 2013-10-23 LAB — KAPPA/LAMBDA LIGHT CHAINS
Kappa free light chain: 50.9 mg/dL — ABNORMAL HIGH (ref 0.33–1.94)
Kappa:Lambda Ratio: 38.56 — ABNORMAL HIGH (ref 0.26–1.65)
LAMBDA FREE LGHT CHN: 1.32 mg/dL (ref 0.57–2.63)

## 2013-10-23 LAB — SPEP & IFE WITH QIG
Albumin ELP: 57.2 % (ref 55.8–66.1)
Alpha-1-Globulin: 6.2 % — ABNORMAL HIGH (ref 2.9–4.9)
Alpha-2-Globulin: 10.6 % (ref 7.1–11.8)
BETA 2: 3.4 % (ref 3.2–6.5)
BETA GLOBULIN: 5.9 % (ref 4.7–7.2)
Gamma Globulin: 16.7 % (ref 11.1–18.8)
IGA: 108 mg/dL (ref 69–380)
IgG (Immunoglobin G), Serum: 793 mg/dL (ref 690–1700)
IgM, Serum: 14 mg/dL — ABNORMAL LOW (ref 52–322)
M-Spike, %: 0.43 g/dL
Total Protein, Serum Electrophoresis: 6.3 g/dL (ref 6.0–8.3)

## 2013-10-30 ENCOUNTER — Encounter: Payer: Self-pay | Admitting: Internal Medicine

## 2013-10-30 ENCOUNTER — Ambulatory Visit (INDEPENDENT_AMBULATORY_CARE_PROVIDER_SITE_OTHER): Payer: Medicare Other | Admitting: Internal Medicine

## 2013-10-30 VITALS — BP 125/64 | HR 58 | Temp 97.1°F | Wt 135.3 lb

## 2013-10-30 DIAGNOSIS — H538 Other visual disturbances: Secondary | ICD-10-CM | POA: Insufficient documentation

## 2013-10-30 DIAGNOSIS — M2010 Hallux valgus (acquired), unspecified foot: Secondary | ICD-10-CM | POA: Diagnosis not present

## 2013-10-30 DIAGNOSIS — M21619 Bunion of unspecified foot: Secondary | ICD-10-CM | POA: Diagnosis not present

## 2013-10-30 DIAGNOSIS — I10 Essential (primary) hypertension: Secondary | ICD-10-CM

## 2013-10-30 DIAGNOSIS — Z23 Encounter for immunization: Secondary | ICD-10-CM | POA: Diagnosis not present

## 2013-10-30 DIAGNOSIS — F3289 Other specified depressive episodes: Secondary | ICD-10-CM | POA: Diagnosis not present

## 2013-10-30 DIAGNOSIS — F329 Major depressive disorder, single episode, unspecified: Secondary | ICD-10-CM | POA: Diagnosis not present

## 2013-10-30 NOTE — Patient Instructions (Signed)
-  Try Cetaphil cream for Dry Skin  -We are referring you to opthalmology today!  Please be sure to bring all of your medications with you to every visit.  Should you have any new or worsening symptoms, please be sure to call the clinic at [phone].

## 2013-10-30 NOTE — Assessment & Plan Note (Signed)
Reports she has been told she has cataracts in the past.  Refer to Optho.

## 2013-10-30 NOTE — Progress Notes (Signed)
Subjective:   Patient ID: Kayla Brooks female   DOB: May 05, 1930 80 y.o.   MRN: [id number]  Chief Complaint  Patient presents with  . routine follow up    HPI: [name] is a 80 y.o. woman with h/o HTN who presents for routine follow up.  Only complaint is b/l palm skin roughness - has tried vaseline petroleum jelly - no relief.  No rash noted.    Would like to be set up with a foot doctor for bunyons.  H/o left bunyon surgery.  Now right foot bunyon more bothersome.    Thinks she has cataracts in her right eye.  +b/l blurry vision, worse in right eye.  Night vision is horrible - has missteps.  No eye pain.  +photophobia.  This has been progressive over many years.    Reports she can't hear well, she thinks right ear is worse.  People have to repeat self frequently, and she has started to feel ashamed of repeatedly asking "what'd you say"  Sometimes dizziness with standing.  Doesn't last.  Not frequently.     Review of Systems: Constitutional: Denies fever, chills, diaphoresis, appetite change and fatigue.  HEENT: Denies hearing loss, ear pain, congestion, sore throat, rhinorrhea, sneezing, mouth sores, trouble swallowing, neck pain, neck stiffness and tinnitus.  Respiratory: Denies SOB, DOE, cough, chest tightness, and wheezing.  Cardiovascular: Denies chest pain, palpitations and leg swelling.  Gastrointestinal: Denies nausea, vomiting, diarrhea, constipation,blood in stool and abdominal distention. + occ stomach discomfort prior to BM - on going for years Genitourinary: Denies dysuria, urgency, frequency, hematuria, flank pain and difficulty urinating.  Musculoskeletal: +muscle aches occasionally, responds well to muscle rubs, no joint pains Skin: Denies pallor, rash and wound.  Neurological: Denies dizziness, seizures, syncope, weakness, lightheadedness, and headaches.  Hematological: No s/s of bleeding     Past Medical History  Diagnosis Date  . Multiple  myeloma without mention of remission   . Monoclonal paraproteinemia   . Hypertension    Current Outpatient Prescriptions  Medication Sig Dispense Refill  . amLODipine (NORVASC) 5 MG tablet Take 1 tablet (5 mg total) by mouth daily.  30 tablet  5  . aspirin 81 MG tablet Take 1 tablet (81 mg total) by mouth daily.  31 tablet  11  . Calcium Carbonate-Vitamin D (CALCIUM 600+D) 600-400 MG-UNIT per tablet Take 1 tablet by mouth daily.  30 tablet  3  . hydrochlorothiazide (MICROZIDE) 12.5 MG capsule Take 1 capsule (12.5 mg total) by mouth daily.  30 capsule  3  . hydrocortisone cream 1 % Apply topically 2 (two) times daily.  30 g  0  . lisinopril (PRINIVIL,ZESTRIL) 40 MG tablet Take 1 tablet (40 mg total) by mouth daily.  90 tablet  3  . Loperamide-Simethicone (IMODIUM ADVANCED) 2-125 MG TABS Take 1 tablet 2 - 3 times per day as needed for diarrhea       . nitroGLYCERIN (NITROSTAT) 0.4 MG SL tablet Place 1 tablet (0.4 mg total) under the tongue every 5 (five) minutes as needed for chest pain.  90 tablet  5  . simvastatin (ZOCOR) 20 MG tablet Take 1 tablet (20 mg total) by mouth at bedtime.  31 tablet  5   No current facility-administered medications for this visit.   No family history on file. History   Social History  . Marital Status: Widowed    Spouse Name: N/A    Number of Children: N/A  . Years of Education: N/A  Social History Main Topics  . Smoking status: Former Research scientist (life sciences)  . Smokeless tobacco: None  . Alcohol Use: No  . Drug Use: No  . Sexual Activity: None   Other Topics Concern  . None   Social History Narrative  . None    Objective:  Physical Exam: Filed Vitals:   10/30/13 1549  BP: 125/64  Pulse: 58  Temp: 97.1 F (36.2 C)  TempSrc: Oral  Weight: 135 lb 4.8 oz (61.372 kg)  SpO2: 100%   General: pleasant, appears younger than stated age 80: right pupil less reactive than left, EOMI, no scleral icterus Cardiac: RRR, no rubs, murmurs or gallops Pulm: clear  to auscultation bilaterally, moving normal volumes of air Abd: soft, nontender, nondistended, BS normoactive Ext: warm and well perfused,  Bilateral pretibial trace pitting edema Neuro: alert and oriented X3, cranial nerves II-XII grossly intact  Assessment & Plan:   Case and care discussed with Dr. Eppie Gibson.  Please see problem oriented charting for further details. Patient to return in 3 months for blood pressure follow up.

## 2013-10-30 NOTE — Assessment & Plan Note (Signed)
BP Readings from Last 3 Encounters:  10/30/13 125/64  10/18/13 141/60  07/19/13 153/72    Lab Results  Component Value Date   NA 145 10/18/2013   K 3.4* 10/18/2013   CREATININE 0.9 10/18/2013    Assessment: Blood pressure control: controlled Progress toward BP goal:  at goal  Plan: Medications:  cont lisinopril 40, amlodipine 5; d/c hctz 12.5 as it seems patient wasn't taking this medication and BP is well controlled, add back if needed

## 2013-11-01 NOTE — Progress Notes (Signed)
Case discussed with Dr. Sharda at time of visit.  We reviewed the resident's history and exam and pertinent patient test results.  I agree with the assessment, diagnosis, and plan of care documented in the resident's note. 

## 2013-12-16 ENCOUNTER — Telehealth: Payer: Self-pay | Admitting: Oncology

## 2013-12-16 NOTE — Telephone Encounter (Signed)
Talked to pt and gave her appt for July r/s due to call, mailed appt

## 2013-12-27 ENCOUNTER — Ambulatory Visit: Payer: Medicare Other

## 2013-12-28 ENCOUNTER — Ambulatory Visit: Payer: Medicare Other

## 2014-01-01 ENCOUNTER — Ambulatory Visit (INDEPENDENT_AMBULATORY_CARE_PROVIDER_SITE_OTHER): Payer: Medicare Other | Admitting: Internal Medicine

## 2014-01-01 ENCOUNTER — Encounter: Payer: Self-pay | Admitting: Internal Medicine

## 2014-01-01 VITALS — BP 121/69 | HR 60 | Temp 98.1°F | Wt 127.7 lb

## 2014-01-01 DIAGNOSIS — K1370 Unspecified lesions of oral mucosa: Secondary | ICD-10-CM | POA: Diagnosis not present

## 2014-01-01 DIAGNOSIS — M2010 Hallux valgus (acquired), unspecified foot: Secondary | ICD-10-CM | POA: Diagnosis not present

## 2014-01-01 DIAGNOSIS — Z23 Encounter for immunization: Secondary | ICD-10-CM | POA: Diagnosis not present

## 2014-01-01 DIAGNOSIS — F3289 Other specified depressive episodes: Secondary | ICD-10-CM | POA: Diagnosis not present

## 2014-01-01 DIAGNOSIS — F329 Major depressive disorder, single episode, unspecified: Secondary | ICD-10-CM | POA: Diagnosis not present

## 2014-01-01 DIAGNOSIS — H538 Other visual disturbances: Secondary | ICD-10-CM | POA: Diagnosis not present

## 2014-01-01 DIAGNOSIS — M21619 Bunion of unspecified foot: Secondary | ICD-10-CM | POA: Diagnosis not present

## 2014-01-01 DIAGNOSIS — I10 Essential (primary) hypertension: Secondary | ICD-10-CM | POA: Diagnosis not present

## 2014-01-01 DIAGNOSIS — K121 Other forms of stomatitis: Secondary | ICD-10-CM

## 2014-01-01 NOTE — Assessment & Plan Note (Signed)
Patient's progressive vision loss is consistent with her reported history of cataracts.  Referral to optho has been delayed due to placing the referral on the wrong insurance plan. -Placed referral through Medicare and appointment with opthalmology scheduled for August.

## 2014-01-01 NOTE — Patient Instructions (Addendum)
Thank you for coming to clinic today [name].  General instructions: -I agree that your gum pain is likely due to your dentures. -I referred you to the dental clinic.  It may take awhile to get an appointment set up, but they should be able to get you some new dentures. -We set up an appointment with an ophthalmologist to address your cataracts. -Please make a follow up appointment to return to clinic in 6 months to see your primary doctor.  Please bring your medicines with you each time you come.   Medicines may be  Eye drops  Herbal   Vitamins  Pills  Seeing these help [place] take care of you.

## 2014-01-01 NOTE — Assessment & Plan Note (Addendum)
Patient's pain is most likely from her old dentures, which are several years old and have broken.  She has visible sores on her gums that would account for the tenderness she describes with eating and brushing her teeth.  She likely needs to be fit for a new set of dentures. -Ambulatory referral to dental clinic. -Continue benzocaine for pain relief in the interim.

## 2014-01-01 NOTE — Assessment & Plan Note (Signed)
BP Readings from Last 3 Encounters:  01/01/14 121/69  10/30/13 125/64  10/18/13 141/60    Lab Results  Component Value Date   NA 145 10/18/2013   K 3.4* 10/18/2013   CREATININE 0.9 10/18/2013    Assessment: Blood pressure control: controlled Progress toward BP goal:  at goal Comments: Good medication compliance.  Plan: Medications:  continue current medications: amlodipine 5 mg daily, lisinopril 40 mg daily. Other plans: Follow up with primary doctor in 6 months.

## 2014-01-01 NOTE — Progress Notes (Signed)
   Subjective:    Patient ID: Kayla Brooks, female    DOB: 25-Jan-1930, 80 y.o.   MRN: [id number]  HPI Comments: Kayla Brooks is a 80 year old female with a history of multiple myeloma, depression, HTN, and GERD who presents for referral to dental clinic.  She reports soreness of her gums for several years.  She thinks it may be related to her dentures, which are 78 years old and broke a few years ago.  Her upper dentures cracked, and she has been having soreness in her top gums and pain at the back of her bottom gums bilaterally.  It makes it difficult for her to brush her teeth.  The pain keeps her from being able to chew on the right side of her mouth.  She has tried benzocaine on her gums, and it temporarily helps.  Tylenol doesn't help.  She reports progressively blurry vision in her eyes greater on the right.  She has been told that she has cataracts and was referred to ophthalmology, but the referral was not completed.  Oral Pain  Pertinent negatives include no fever.      Review of Systems  Constitutional: Negative for fever and chills.  HENT: Negative for congestion.   Respiratory: Negative for cough and shortness of breath.   Cardiovascular: Negative for chest pain.  Gastrointestinal: Negative for nausea, vomiting, diarrhea and constipation.  Genitourinary: Negative for difficulty urinating.  Musculoskeletal: Negative for arthralgias and back pain.  Skin: Negative for rash.  Neurological: Positive for headaches (Temporal pain occasionally.).  Psychiatric/Behavioral: Negative.        Objective:   Physical Exam  Constitutional: She is oriented to person, place, and time. She appears well-developed and well-nourished. No distress.  HENT:  Head: Normocephalic and atraumatic.  Mouth/Throat: No oropharyngeal exudate.  Sore on right bottom alveolar ridge towards rear of mouth under dentures.  Erythema and maceration on upper anterior alveolar ridge beneath dentures.   Normal mandible range of motion without pain or popping.  Eyes: Conjunctivae and EOM are normal. Pupils are equal, round, and reactive to light. Right eye exhibits no discharge. Left eye exhibits no discharge. No scleral icterus.  Decreased vision in right>left eye.  Neck: Normal range of motion. Neck supple. No tracheal deviation present. No thyromegaly present.  Cardiovascular: Normal rate, regular rhythm, normal heart sounds and intact distal pulses.  Exam reveals no gallop and no friction rub.   No murmur heard. Pulmonary/Chest: Effort normal and breath sounds normal. No respiratory distress. She has no wheezes. She has no rales.  Abdominal: Soft. Bowel sounds are normal.  Musculoskeletal: Normal range of motion. She exhibits no edema and no tenderness.  Lymphadenopathy:    She has no cervical adenopathy.  Neurological: She is alert and oriented to person, place, and time. No cranial nerve deficit. Coordination normal.  Skin: Skin is warm and dry. She is not diaphoretic.  Psychiatric: She has a normal mood and affect.          Assessment & Plan:  Please see problem-based assessment and plan.

## 2014-01-01 NOTE — Progress Notes (Signed)
I saw and evaluated the patient.  I personally confirmed the key portions of the history and exam documented by Dr. McLean and I reviewed pertinent patient test results.  The assessment, diagnosis, and plan were formulated together and I agree with the documentation in the resident's note.    

## 2014-01-17 ENCOUNTER — Telehealth: Payer: Self-pay | Admitting: Oncology

## 2014-01-17 ENCOUNTER — Other Ambulatory Visit (HOSPITAL_BASED_OUTPATIENT_CLINIC_OR_DEPARTMENT_OTHER): Payer: Medicare Other

## 2014-01-17 ENCOUNTER — Ambulatory Visit (HOSPITAL_BASED_OUTPATIENT_CLINIC_OR_DEPARTMENT_OTHER): Payer: Medicare Other | Admitting: Oncology

## 2014-01-17 ENCOUNTER — Encounter: Payer: Self-pay | Admitting: Oncology

## 2014-01-17 VITALS — BP 141/59 | HR 54 | Temp 98.3°F | Resp 18 | Ht 65.0 in | Wt 127.8 lb

## 2014-01-17 DIAGNOSIS — C9000 Multiple myeloma not having achieved remission: Secondary | ICD-10-CM | POA: Diagnosis not present

## 2014-01-17 DIAGNOSIS — M25551 Pain in right hip: Secondary | ICD-10-CM

## 2014-01-17 DIAGNOSIS — M25559 Pain in unspecified hip: Secondary | ICD-10-CM

## 2014-01-17 LAB — CBC WITH DIFFERENTIAL/PLATELET
BASO%: 0.2 % (ref 0.0–2.0)
Basophils Absolute: 0 10*3/uL (ref 0.0–0.1)
EOS ABS: 0.1 10*3/uL (ref 0.0–0.5)
EOS%: 1.1 % (ref 0.0–7.0)
HEMATOCRIT: 33.1 % — AB (ref 34.8–46.6)
HEMOGLOBIN: 10.8 g/dL — AB (ref 11.6–15.9)
LYMPH#: 1.3 10*3/uL (ref 0.9–3.3)
LYMPH%: 25.6 % (ref 14.0–49.7)
MCH: 30.7 pg (ref 25.1–34.0)
MCHC: 32.6 g/dL (ref 31.5–36.0)
MCV: 94 fL (ref 79.5–101.0)
MONO#: 0.4 10*3/uL (ref 0.1–0.9)
MONO%: 7.1 % (ref 0.0–14.0)
NEUT%: 66 % (ref 38.4–76.8)
NEUTROS ABS: 3.5 10*3/uL (ref 1.5–6.5)
Platelets: 170 10*3/uL (ref 145–400)
RBC: 3.52 10*6/uL — ABNORMAL LOW (ref 3.70–5.45)
RDW: 12.7 % (ref 11.2–14.5)
WBC: 5.2 10*3/uL (ref 3.9–10.3)

## 2014-01-17 LAB — COMPREHENSIVE METABOLIC PANEL (CC13)
ALK PHOS: 123 U/L (ref 40–150)
ALT: 18 U/L (ref 0–55)
AST: 22 U/L (ref 5–34)
Albumin: 3.4 g/dL — ABNORMAL LOW (ref 3.5–5.0)
Anion Gap: 7 mEq/L (ref 3–11)
BUN: 23.1 mg/dL (ref 7.0–26.0)
CO2: 25 mEq/L (ref 22–29)
CREATININE: 1 mg/dL (ref 0.6–1.1)
Calcium: 9.1 mg/dL (ref 8.4–10.4)
Chloride: 111 mEq/L — ABNORMAL HIGH (ref 98–109)
Glucose: 85 mg/dl (ref 70–140)
Potassium: 4 mEq/L (ref 3.5–5.1)
Sodium: 143 mEq/L (ref 136–145)
Total Bilirubin: 0.53 mg/dL (ref 0.20–1.20)
Total Protein: 6.6 g/dL (ref 6.4–8.3)

## 2014-01-17 NOTE — Telephone Encounter (Signed)
gv and prnted appt scehd and avs for pt for OCT

## 2014-01-17 NOTE — Progress Notes (Signed)
Hematology and Oncology Follow Up Visit  Kayla Brooks [id number] 1929-10-06 80 y.o. 01/17/2014 11:15 AM  CC: [name], MD  [name], MD, [facility]  [name], M.D., Ph.D.    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  She had a light chain multiple myeloma with M spike of about 0.35 gm/dL.  Prior Therapy: 1. Status post prophylactic fixation impending pathological fracture of the right femur done in August 2009. 2. External beam radiation concluded in 2010 at the right femur. 3. Revlimid 25 mg daily as maintenance therapy for 2 years ending 03/2012  Current therapy:  Watchful observation.  Interim History:  Kayla Brooks presents today for a followup visit. Since her last visit, she has been doing well up to last few weeks. She developed increased right hip pain without any trauma. She reports no neurological symptoms or problem with ambulation. She feels constant pressure in her right hip sometimes graded 6 or 7/10. It's not radiating and not associated with any neurological symptoms. She still able to perform activities of daily living including public transportations to get to her appointments. Otherwise she is asymptomatic. She had not had any nausea, not had any abdominal distension.  For the most part, performance status and activity level continue to be at baseline. Had not had any pathological fractures.  Had not had any falls at this time. Her appetite is still good. She reports no recent hospitalizations or illnesses. Has not reported any recurrent sinopulmonary infections.   She has not had any falls or fractures. She has not reported any headaches or blurry vision. Has not reported any chest pain or shortness of breath. Is not reporting any change in her bowel habits. Rest of her review of systems unremarkable.   Medications: I  have reviewed the patient's current medications.  Current Outpatient Prescriptions  Medication Sig Dispense Refill  . amLODipine (NORVASC) 5 MG tablet Take 1 tablet (5 mg total) by mouth daily.  30 tablet  5  . aspirin 81 MG tablet Take 1 tablet (81 mg total) by mouth daily.  31 tablet  11  . Calcium Carbonate-Vitamin D (CALCIUM 600+D) 600-400 MG-UNIT per tablet Take 1 tablet by mouth daily.  30 tablet  3  . hydrocortisone cream 1 % Apply topically 2 (two) times daily.  30 g  0  . lisinopril (PRINIVIL,ZESTRIL) 40 MG tablet Take 1 tablet (40 mg total) by mouth daily.  90 tablet  3  . Loperamide-Simethicone (IMODIUM ADVANCED) 2-125 MG TABS Take 1 tablet 2 - 3 times per day as needed for diarrhea       . nitroGLYCERIN (NITROSTAT) 0.4 MG SL tablet Place 1 tablet (0.4 mg total) under the tongue every 5 (five) minutes as needed for chest pain.  90 tablet  5  . simvastatin (ZOCOR) 20 MG tablet Take 1 tablet (20 mg total) by mouth at bedtime.  31 tablet  5   No current facility-administered medications for this visit.  , Allergies:  Allergies  Allergen Reactions  . Morphine And Related     Abdominal Pain    Past Medical History, Surgical history, Social history, and Family History were reviewed and updated.   Physical Exam: Blood pressure 141/59, pulse 54, temperature 98.3 F (36.8 C), temperature source Oral, resp. rate 18, height '5\' 5"'  (1.651 m), weight 127 lb 12.8 oz (57.97  kg), SpO2 100.00%. ECOG: 1 General appearance: alert Head: Normocephalic, without obvious abnormality, atraumatic Neck: no adenopathy, no carotid bruit, no JVD, supple, symmetrical, trachea midline and thyroid not enlarged, symmetric, no tenderness/mass/nodules Lymph nodes: Cervical, supraclavicular, and axillary nodes normal. Heart:regular rate and rhythm, S1, S2 normal, no murmur, click, rub or gallop Lung:chest clear, no wheezing, rales, normal symmetric air entry Abdomen: soft, non-tender, without masses or  organomegaly EXT:no evidence of joint effusion. She has painful range of motion in her right hip especially with internal and external rotation. Skin: No rash or desquamation.   Lab Results: Lab Results  Component Value Date   WBC 5.2 01/17/2014   HGB 10.8* 01/17/2014   HCT 33.1* 01/17/2014   MCV 94.0 01/17/2014   PLT 170 01/17/2014   Results for Kayla Brooks, Kayla Brooks (MRN [id number]) as of 01/17/2014 10:59  Ref. Range 11/14/2012 14:04 04/18/2013 15:22 07/19/2013 15:15 10/18/2013 16:07  Kappa free light chain Latest Range: 0.33-1.94 mg/dL 25.70 (H) 47.20 (H) 38.60 (H) 50.90 (H)  Lambda Free Lght Chn Latest Range: 0.57-2.63 mg/dL 1.43 1.66 1.23 1.32  Kappa:Lambda Ratio Latest Range: 0.26-1.65  17.97 (H) 28.43 (H) 31.38 (H) 38.56 (H)    EXAM:  METASTATIC BONE SURVEY  COMPARISON: DG BONE SURVEY MET dated 01/09/2013; DG BONE SURVEY MET  dated 02/10/2011; DG BONE SURVEY MET dated 01/06/2012; CT ABD/PELVIS W  CM dated 02/16/2010  FINDINGS:  Calvarium: The previously questioned 3 - 4 mm lucency within the  calvarium is unchanged though now favored to represent a prominent  vascular channel. No aggressive osseous lesion lesions within the  calvarium.  Chest: Enlarged cardiac silhouette and mediastinal contours.  Atherosclerotic plaque within the thoracic aorta. The lungs remain  hyperexpanded. No focal airspace opacities.  Cervical spine: No aggressive osseous lesions. Mild to moderate  multilevel cervical spine DDD, likely worse at C3-C4 and C5-C6 with  disc space height loss, endplate irregularity and sclerosis.  Thoracic spine: No aggressive osseous lesions There is an unchanged  mild scoliotic curvature of the thoracic spine with dominant  curvature convex to the left.  Lumbar spine: No aggressive osseous lesions. Mild rotatory scoliotic  curvature of the lumbar spine.  Pelvis: No aggressive osseous lesions.  Right femur and lower leg: No progress osseous lesions. Post right  femoral intra  medullary rod fixation and dynamic screw fixation of  the right femoral neck. No evidence of hardware failure loosening.  Left femur and lower leg: No progress osseous lesions. Post surgical  change of the first MTT joint, incompletely evaluated.  Right humerus and forearm: No aggressive osseous lesions.  Left humerus and forearm: No aggressive osseous lesions,  IMPRESSION:  1. No discrete aggressive osseous lesions with special attention  paid to the right hip and pelvis.  2. The previously questioned 3-4 mm lucency within the calvarium is  unchanged though now favored to represent a prominent vascular  channel.  3. Post intra medullary rod fixation of the right femur and femoral  neck without evidence of hardware failure    Impression and Plan:  A 80 year old female with the following issues. 1. History of a plasmacytoma of the right femur.  She is status post surgical fixation followed by external beam radiation. She is experiencing increased pain in her right hip without any radiographic evidence by x-ray on 10/18/2013. I will obtain an MRI of that hip to rule out any malignant etiology. And depending on the results of the MRI we will proceed with the next up. She is following  up with orthopedic surgery at [facility] and certainly we can refer her back for an evaluation. 2. 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  She has been off Revlimid since 03/2012. Her kappa free light chain is under reasonable control. Skeletal survey repeated in 09/2013 and shows no major changes. Her symptoms are better now off Revlimid. We will follow off on any systemic therapy unless she develops systemic disease. 3. Bony disease.  Continue to be on calcium and vitamin D supplements.  I will hold off on bisphosphonate for the time being. 4. Follow up in 3 months sooner if needed to.      TKWIOX,BDZHG 7/30/201511:15 AM

## 2014-01-21 LAB — SPEP & IFE WITH QIG
ALBUMIN ELP: 56.8 % (ref 55.8–66.1)
ALPHA-1-GLOBULIN: 4.6 % (ref 2.9–4.9)
Alpha-2-Globulin: 10.1 % (ref 7.1–11.8)
Beta 2: 4.3 % (ref 3.2–6.5)
Beta Globulin: 5.7 % (ref 4.7–7.2)
Gamma Globulin: 18.5 % (ref 11.1–18.8)
IGM, SERUM: 13 mg/dL — AB (ref 52–322)
IgA: 93 mg/dL (ref 69–380)
IgG (Immunoglobin G), Serum: 788 mg/dL (ref 690–1700)
M-Spike, %: 0.61 g/dL
TOTAL PROTEIN, SERUM ELECTROPHOR: 6.4 g/dL (ref 6.0–8.3)

## 2014-01-21 LAB — KAPPA/LAMBDA LIGHT CHAINS
KAPPA LAMBDA RATIO: 46.59 — AB (ref 0.26–1.65)
Kappa free light chain: 57.3 mg/dL — ABNORMAL HIGH (ref 0.33–1.94)
LAMBDA FREE LGHT CHN: 1.23 mg/dL (ref 0.57–2.63)

## 2014-01-22 DIAGNOSIS — M25559 Pain in unspecified hip: Secondary | ICD-10-CM | POA: Diagnosis not present

## 2014-01-22 DIAGNOSIS — C9030 Solitary plasmacytoma not having achieved remission: Secondary | ICD-10-CM | POA: Diagnosis not present

## 2014-01-22 DIAGNOSIS — M169 Osteoarthritis of hip, unspecified: Secondary | ICD-10-CM | POA: Diagnosis not present

## 2014-01-22 DIAGNOSIS — M1610 Unilateral primary osteoarthritis, unspecified hip: Secondary | ICD-10-CM | POA: Diagnosis not present

## 2014-01-23 ENCOUNTER — Ambulatory Visit (HOSPITAL_COMMUNITY)
Admission: RE | Admit: 2014-01-23 | Discharge: 2014-01-23 | Disposition: A | Discharge status: Home / Self Care | Payer: Medicare Other | Source: Ambulatory Visit | Attending: Oncology | Admitting: Oncology

## 2014-01-23 DIAGNOSIS — C9000 Multiple myeloma not having achieved remission: Secondary | ICD-10-CM

## 2014-01-23 DIAGNOSIS — M25559 Pain in unspecified hip: Secondary | ICD-10-CM | POA: Diagnosis present

## 2014-01-23 DIAGNOSIS — M1610 Unilateral primary osteoarthritis, unspecified hip: Secondary | ICD-10-CM | POA: Diagnosis not present

## 2014-01-23 DIAGNOSIS — M25551 Pain in right hip: Secondary | ICD-10-CM

## 2014-01-23 DIAGNOSIS — M169 Osteoarthritis of hip, unspecified: Secondary | ICD-10-CM | POA: Diagnosis not present

## 2014-01-23 MED ORDER — GADOBENATE DIMEGLUMINE 529 MG/ML IV SOLN
15.0000 mL | Freq: Once | INTRAVENOUS | Status: AC | PRN
  Administered 2014-01-23: 12 mL via INTRAVENOUS

## 2014-02-15 DIAGNOSIS — H2510 Age-related nuclear cataract, unspecified eye: Secondary | ICD-10-CM | POA: Diagnosis not present

## 2014-02-15 DIAGNOSIS — H2589 Other age-related cataract: Secondary | ICD-10-CM | POA: Diagnosis not present

## 2014-02-15 DIAGNOSIS — H348392 Tributary (branch) retinal vein occlusion, unspecified eye, stable: Secondary | ICD-10-CM | POA: Diagnosis not present

## 2014-02-19 ENCOUNTER — Telehealth: Payer: Self-pay | Admitting: *Deleted

## 2014-02-19 NOTE — Telephone Encounter (Signed)
Spoke with patient, per dr Alen Blew, mri of hip show only arthritis, no cancer. Patient verbalizes understanding.

## 2014-03-14 DIAGNOSIS — H2510 Age-related nuclear cataract, unspecified eye: Secondary | ICD-10-CM | POA: Diagnosis not present

## 2014-03-14 DIAGNOSIS — H2589 Other age-related cataract: Secondary | ICD-10-CM | POA: Diagnosis not present

## 2014-04-05 DIAGNOSIS — M25551 Pain in right hip: Secondary | ICD-10-CM | POA: Diagnosis not present

## 2014-04-09 ENCOUNTER — Other Ambulatory Visit: Payer: Medicare Other

## 2014-04-09 ENCOUNTER — Ambulatory Visit: Payer: Medicare Other | Admitting: Oncology

## 2014-04-10 ENCOUNTER — Telehealth: Payer: Self-pay | Admitting: Oncology

## 2014-04-10 NOTE — Telephone Encounter (Signed)
Pt called to sch per 07/30 POF labs/ov confirmed by pt .Marland Kitchen... KJ

## 2014-04-12 ENCOUNTER — Encounter: Payer: Self-pay | Admitting: Oncology

## 2014-04-12 ENCOUNTER — Telehealth: Payer: Self-pay | Admitting: Oncology

## 2014-04-12 ENCOUNTER — Ambulatory Visit (HOSPITAL_BASED_OUTPATIENT_CLINIC_OR_DEPARTMENT_OTHER): Payer: Medicare Other | Admitting: Oncology

## 2014-04-12 ENCOUNTER — Other Ambulatory Visit (HOSPITAL_BASED_OUTPATIENT_CLINIC_OR_DEPARTMENT_OTHER): Payer: Medicare Other

## 2014-04-12 VITALS — BP 121/57 | HR 53 | Temp 98.3°F | Resp 18 | Ht 65.0 in | Wt 124.8 lb

## 2014-04-12 DIAGNOSIS — C9000 Multiple myeloma not having achieved remission: Secondary | ICD-10-CM

## 2014-04-12 DIAGNOSIS — Z8579 Personal history of other malignant neoplasms of lymphoid, hematopoietic and related tissues: Secondary | ICD-10-CM | POA: Diagnosis not present

## 2014-04-12 DIAGNOSIS — C9001 Multiple myeloma in remission: Secondary | ICD-10-CM | POA: Diagnosis not present

## 2014-04-12 DIAGNOSIS — M25551 Pain in right hip: Secondary | ICD-10-CM

## 2014-04-12 DIAGNOSIS — M25559 Pain in unspecified hip: Secondary | ICD-10-CM | POA: Diagnosis not present

## 2014-04-12 LAB — CBC WITH DIFFERENTIAL/PLATELET
BASO%: 1 % (ref 0.0–2.0)
Basophils Absolute: 0.1 10e3/uL (ref 0.0–0.1)
EOS%: 0.5 % (ref 0.0–7.0)
Eosinophils Absolute: 0 10e3/uL (ref 0.0–0.5)
HCT: 38.2 % (ref 34.8–46.6)
HGB: 12.3 g/dL (ref 11.6–15.9)
LYMPH%: 15.7 % (ref 14.0–49.7)
MCH: 31.3 pg (ref 25.1–34.0)
MCHC: 32.3 g/dL (ref 31.5–36.0)
MCV: 96.9 fL (ref 79.5–101.0)
MONO#: 0.5 10e3/uL (ref 0.1–0.9)
MONO%: 5.9 % (ref 0.0–14.0)
NEUT#: 6.6 10e3/uL — ABNORMAL HIGH (ref 1.5–6.5)
NEUT%: 76.9 % — ABNORMAL HIGH (ref 38.4–76.8)
Platelets: 216 10e3/uL (ref 145–400)
RBC: 3.94 10e6/uL (ref 3.70–5.45)
RDW: 13.4 % (ref 11.2–14.5)
WBC: 8.6 10e3/uL (ref 3.9–10.3)
lymph#: 1.3 10e3/uL (ref 0.9–3.3)

## 2014-04-12 LAB — COMPREHENSIVE METABOLIC PANEL (CC13)
ALT: 30 U/L (ref 0–55)
AST: 18 U/L (ref 5–34)
Albumin: 3.7 g/dL (ref 3.5–5.0)
Alkaline Phosphatase: 119 U/L (ref 40–150)
Anion Gap: 9 meq/L (ref 3–11)
BUN: 36.8 mg/dL — ABNORMAL HIGH (ref 7.0–26.0)
CO2: 20 meq/L — ABNORMAL LOW (ref 22–29)
Calcium: 9.3 mg/dL (ref 8.4–10.4)
Chloride: 111 meq/L — ABNORMAL HIGH (ref 98–109)
Creatinine: 1.2 mg/dL — ABNORMAL HIGH (ref 0.6–1.1)
Glucose: 103 mg/dL (ref 70–140)
Potassium: 4 meq/L (ref 3.5–5.1)
Sodium: 140 meq/L (ref 136–145)
Total Bilirubin: 0.54 mg/dL (ref 0.20–1.20)
Total Protein: 6.8 g/dL (ref 6.4–8.3)

## 2014-04-12 NOTE — Telephone Encounter (Signed)
Gave avs & cal for Jan. °

## 2014-04-12 NOTE — Progress Notes (Signed)
Hematology and Oncology Follow Up Visit  Kayla Brooks [id number] 1929-09-09 80 y.o. 04/12/2014 11:56 AM  CC: Kayla Ser, MD  Kayla Brooks. Kayla Blalock, MD, [facility]  Kayla Brooks, M.D., Ph.D.    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  She had a light chain multiple myeloma with M spike of about 0.35 gm/dL.  Prior Therapy: 1. Status post prophylactic fixation impending pathological fracture of the right femur done in August 2009. 2. External beam radiation concluded in 2010 at the right femur. 3. Revlimid 25 mg daily as maintenance therapy for 2 years ending 03/2012  Current therapy:  Watchful observation.  Interim History:  Kayla Brooks presents today for a followup visit. Since her last visit, she has been doing well. She has noted more right hip pain. She has been seen by Ortho and had an injection done. She is also is using an OTC cream to the area which is helping. Pain is not radiating and not associated with any neurological symptoms. She still able to perform activities of daily living including public transportations to get to her appointments. Otherwise she is asymptomatic. She had not had any nausea, not had any abdominal distension.  For the most part, performance status and activity level continue to be at baseline. Had not had any pathological fractures.  Had not had any falls at this time. Her appetite is still good. She reports no recent hospitalizations or illnesses. Has not reported any recurrent sinopulmonary infections.   She has not had any falls or fractures. She has not reported any headaches or blurry vision. Has not reported any chest pain or shortness of breath. Is not reporting any change in her bowel habits. Rest of her review of systems unremarkable.   Medications: I have reviewed the patient's current medications.  Current  Outpatient Prescriptions  Medication Sig Dispense Refill  . amLODipine (NORVASC) 5 MG tablet Take 1 tablet (5 mg total) by mouth daily.  30 tablet  5  . aspirin 81 MG tablet Take 1 tablet (81 mg total) by mouth daily.  31 tablet  11  . Calcium Carbonate-Vitamin D (CALCIUM 600+D) 600-400 MG-UNIT per tablet Take 1 tablet by mouth daily.  30 tablet  3  . lisinopril (PRINIVIL,ZESTRIL) 40 MG tablet Take 1 tablet (40 mg total) by mouth daily.  90 tablet  3  . nitroGLYCERIN (NITROSTAT) 0.4 MG SL tablet Place 1 tablet (0.4 mg total) under the tongue every 5 (five) minutes as needed for chest pain.  90 tablet  5  . simvastatin (ZOCOR) 20 MG tablet Take 1 tablet (20 mg total) by mouth at bedtime.  31 tablet  5  . hydrocortisone cream 1 % Apply topically 2 (two) times daily.  30 g  0  . Loperamide-Simethicone (IMODIUM ADVANCED) 2-125 MG TABS Take 1 tablet 2 - 3 times per day as needed for diarrhea        No current facility-administered medications for this visit.  , Allergies:  Allergies  Allergen Reactions  . Morphine And Related     Abdominal Pain    Past Medical History, Surgical history, Social history, and Family History were reviewed and updated.   Physical Exam: Blood pressure 121/57, pulse 53, temperature 98.3 F (36.8 C), temperature source Oral, resp. rate 18, height '5\' 5"'  (1.651 m), weight 124 lb 12.8 oz (56.609 kg), SpO2 100.00%.  ECOG: 1 General appearance: alert Head: Normocephalic, without obvious abnormality, atraumatic Neck: no adenopathy, no carotid bruit, no JVD, supple, symmetrical, trachea midline and thyroid not enlarged, symmetric, no tenderness/mass/nodules Lymph nodes: Cervical, supraclavicular, and axillary nodes normal. Heart:regular rate and rhythm, S1, S2 normal, no murmur, click, rub or gallop Lung:chest clear, no wheezing, rales, normal symmetric air entry Abdomen: soft, non-tender, without masses or organomegaly EXT:no evidence of joint effusion. She has painful  range of motion in her right hip especially with internal and external rotation. Skin: No rash or desquamation.   Lab Results: Lab Results  Component Value Date   WBC 8.6 04/12/2014   HGB 12.3 04/12/2014   HCT 38.2 04/12/2014   MCV 96.9 04/12/2014   PLT 216 04/12/2014   Results for [name], Brooks (MRN [id number]) as of 04/12/2014 11:58  Ref. Range 04/18/2013 15:22 07/19/2013 15:15 10/18/2013 16:07 01/17/2014 10:37  Kappa free light chain Latest Range: 0.33-1.94 mg/dL 47.20 (H) 38.60 (H) 50.90 (H) 57.30 (H)  Lambda Free Lght Chn Latest Range: 0.57-2.63 mg/dL 1.66 1.23 1.32 1.23  Kappa:Lambda Ratio Latest Range: 0.26-1.65  28.43 (H) 31.38 (H) 38.56 (H) 46.59 (H)       Impression and Plan:  A 80 year old female with the following issues. 1. History of a plasmacytoma of the right femur.  She is status post surgical fixation followed by external beam radiation. She is experiencing increased pain in her right hip without any radiographic evidence by x-ray on 10/18/2013. MRI of the hip from 01/2014 did not show any evidence of myeloma.  2. 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  She has been off Revlimid since 03/2012. Her kappa free light chain is under reasonable control. Skeletal survey repeated in 09/2013 and shows no major changes. Her symptoms are better now off Revlimid. We will follow off on any systemic therapy unless she develops systemic disease. 3. Bony disease.  Continue to be on calcium and vitamin D supplements.  I will hold off on bisphosphonate for the time being. 4. Follow up in 3 months sooner if needed to.      Kayla Brooks 10/23/201511:56 AM

## 2014-04-15 ENCOUNTER — Telehealth: Payer: Self-pay | Admitting: *Deleted

## 2014-04-15 DIAGNOSIS — I10 Essential (primary) hypertension: Secondary | ICD-10-CM

## 2014-04-15 NOTE — Telephone Encounter (Signed)
Pt wants norvasc refilled, she runs out today, i cannot put request in because the system wants a diag code for the med, could you please add a diag code and fill for a 90 day supply

## 2014-04-16 LAB — SPEP & IFE WITH QIG
ALPHA-2-GLOBULIN: 10.3 % (ref 7.1–11.8)
Albumin ELP: 57.7 % (ref 55.8–66.1)
Alpha-1-Globulin: 4.3 % (ref 2.9–4.9)
Beta 2: 3.7 % (ref 3.2–6.5)
Beta Globulin: 6.1 % (ref 4.7–7.2)
GAMMA GLOBULIN: 17.9 % (ref 11.1–18.8)
IGM, SERUM: 11 mg/dL — AB (ref 52–322)
IgA: 74 mg/dL (ref 69–380)
IgG (Immunoglobin G), Serum: 700 mg/dL (ref 690–1700)
M-Spike, %: 0.64 g/dL
Total Protein, Serum Electrophoresis: 6.8 g/dL (ref 6.0–8.3)

## 2014-04-16 LAB — KAPPA/LAMBDA LIGHT CHAINS
Kappa free light chain: 58.5 mg/dL — ABNORMAL HIGH (ref 0.33–1.94)
Kappa:Lambda Ratio: 69.64 — ABNORMAL HIGH (ref 0.26–1.65)
Lambda Free Lght Chn: 0.84 mg/dL (ref 0.57–2.63)

## 2014-04-17 MED ORDER — AMLODIPINE BESYLATE 5 MG PO TABS: 5.0000 mg | ORAL_TABLET | Freq: Every day | ORAL | Status: DC

## 2014-05-07 DIAGNOSIS — C9000 Multiple myeloma not having achieved remission: Secondary | ICD-10-CM | POA: Diagnosis not present

## 2014-05-07 DIAGNOSIS — M1990 Unspecified osteoarthritis, unspecified site: Secondary | ICD-10-CM | POA: Diagnosis not present

## 2014-05-07 DIAGNOSIS — C9030 Solitary plasmacytoma not having achieved remission: Secondary | ICD-10-CM | POA: Diagnosis not present

## 2014-05-07 DIAGNOSIS — M25551 Pain in right hip: Secondary | ICD-10-CM | POA: Diagnosis not present

## 2014-06-24 DIAGNOSIS — H34831 Tributary (branch) retinal vein occlusion, right eye: Secondary | ICD-10-CM | POA: Diagnosis not present

## 2014-06-24 DIAGNOSIS — Z961 Presence of intraocular lens: Secondary | ICD-10-CM | POA: Diagnosis not present

## 2014-06-24 DIAGNOSIS — H2512 Age-related nuclear cataract, left eye: Secondary | ICD-10-CM | POA: Diagnosis not present

## 2014-07-12 ENCOUNTER — Other Ambulatory Visit: Payer: Self-pay | Admitting: *Deleted

## 2014-07-12 ENCOUNTER — Ambulatory Visit: Payer: Medicare Other | Admitting: Oncology

## 2014-07-12 ENCOUNTER — Other Ambulatory Visit: Payer: Medicare Other

## 2014-07-12 ENCOUNTER — Telehealth: Payer: Self-pay | Admitting: Oncology

## 2014-07-12 NOTE — Telephone Encounter (Signed)
Confirm appointment reschedule from 1/22 to 1/29.

## 2014-07-19 ENCOUNTER — Other Ambulatory Visit (HOSPITAL_BASED_OUTPATIENT_CLINIC_OR_DEPARTMENT_OTHER): Payer: Medicare Other

## 2014-07-19 ENCOUNTER — Telehealth: Payer: Self-pay | Admitting: Oncology

## 2014-07-19 ENCOUNTER — Ambulatory Visit (HOSPITAL_BASED_OUTPATIENT_CLINIC_OR_DEPARTMENT_OTHER): Payer: Medicare Other | Admitting: Physician Assistant

## 2014-07-19 ENCOUNTER — Encounter: Payer: Self-pay | Admitting: Physician Assistant

## 2014-07-19 VITALS — BP 140/64 | HR 51 | Temp 98.3°F | Resp 18 | Ht 65.0 in | Wt 118.4 lb

## 2014-07-19 DIAGNOSIS — M25551 Pain in right hip: Secondary | ICD-10-CM | POA: Diagnosis not present

## 2014-07-19 DIAGNOSIS — C9000 Multiple myeloma not having achieved remission: Secondary | ICD-10-CM

## 2014-07-19 DIAGNOSIS — M899 Disorder of bone, unspecified: Secondary | ICD-10-CM

## 2014-07-19 LAB — CBC WITH DIFFERENTIAL/PLATELET
BASO%: 0.7 % (ref 0.0–2.0)
BASOS ABS: 0 10*3/uL (ref 0.0–0.1)
EOS%: 0.9 % (ref 0.0–7.0)
Eosinophils Absolute: 0.1 10*3/uL (ref 0.0–0.5)
HEMATOCRIT: 35 % (ref 34.8–46.6)
HGB: 11.2 g/dL — ABNORMAL LOW (ref 11.6–15.9)
LYMPH#: 1.5 10*3/uL (ref 0.9–3.3)
LYMPH%: 26.2 % (ref 14.0–49.7)
MCH: 30.9 pg (ref 25.1–34.0)
MCHC: 31.9 g/dL (ref 31.5–36.0)
MCV: 96.8 fL (ref 79.5–101.0)
MONO#: 0.4 10*3/uL (ref 0.1–0.9)
MONO%: 6.1 % (ref 0.0–14.0)
NEUT%: 66.1 % (ref 38.4–76.8)
NEUTROS ABS: 3.9 10*3/uL (ref 1.5–6.5)
PLATELETS: 187 10*3/uL (ref 145–400)
RBC: 3.62 10*6/uL — ABNORMAL LOW (ref 3.70–5.45)
RDW: 12.5 % (ref 11.2–14.5)
WBC: 5.9 10*3/uL (ref 3.9–10.3)

## 2014-07-19 LAB — COMPREHENSIVE METABOLIC PANEL (CC13)
ALK PHOS: 75 U/L (ref 40–150)
ALT: 12 U/L (ref 0–55)
AST: 19 U/L (ref 5–34)
Albumin: 3.7 g/dL (ref 3.5–5.0)
Anion Gap: 11 mEq/L (ref 3–11)
BUN: 17.9 mg/dL (ref 7.0–26.0)
CO2: 24 meq/L (ref 22–29)
Calcium: 8.7 mg/dL (ref 8.4–10.4)
Chloride: 108 mEq/L (ref 98–109)
Creatinine: 0.8 mg/dL (ref 0.6–1.1)
EGFR: 78 mL/min/{1.73_m2} — AB (ref >90)
Glucose: 91 mg/dl (ref 70–140)
Potassium: 3.9 mEq/L (ref 3.5–5.1)
Sodium: 143 mEq/L (ref 136–145)
TOTAL PROTEIN: 6.8 g/dL (ref 6.4–8.3)
Total Bilirubin: 0.99 mg/dL (ref 0.20–1.20)

## 2014-07-19 MED ORDER — HYDROCODONE-ACETAMINOPHEN 5-325 MG PO TABS: 1.0000 | ORAL_TABLET | Freq: Four times a day (QID) | ORAL | Status: DC | PRN

## 2014-07-19 NOTE — Progress Notes (Signed)
Hematology and Oncology Follow Up Visit  [name] [id number] 01-19-1930 81 y.o. 07/19/2014 4:16 PM  CC: Kayla Ser, MD  Kayla Brooks. [name], MD, [facility]  Kayla Brooks, M.D., Ph.D.    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  She had a light chain multiple myeloma with M spike of about 0.35 gm/dL.  Prior Therapy: 1. Status post prophylactic fixation impending pathological fracture of the right femur done in August 2009. 2. External beam radiation concluded in 2010 at the right femur. 3. Revlimid 25 mg daily as maintenance therapy for 2 years ending 03/2012  Current therapy:  Watchful observation.  Interim History:  Ms. Bonanno presents today for a followup visit. Since her last visit, she has been doing relatively well. She has noted more right hip pain. She is obtained no relief with over-the-counter pain medications or topical pain relief rubs.  Pain is not radiating and not associated with any neurological symptoms. Her ability to perform her activities of daily living is decreased secondary to the pain. She was previously operated on by Dr. Redmond Pulling, and orthopedist in [place] and she states that she saw him several months ago however her hip pain was not as bad as it is now. She reports taking a misstep and falling around 05/25/2014. She was able to get up and walk to her brothers house without difficulty. Although since then she feels that she's had increased pain and decreased ability to walk. able to perform activities of daily living including public transportations to get to her appointments. Otherwise she is asymptomatic. She had not had any nausea, not had any abdominal distension.  For the most part, performance status and activity level are decreased from baseline. Has not had any pathological fractures.   Her appetite is still  good. She reports no recent hospitalizations or illnesses. Has not reported any recurrent sinopulmonary infections.   She has not had any falls or fractures. She has not reported any headaches or blurry vision. Has not reported any chest pain or shortness of breath. Is not reporting any change in her bowel habits. Remainder of her review of systems unremarkable.   Medications: I have reviewed the patient's current medications.  Current Outpatient Prescriptions  Medication Sig Dispense Refill  . amLODipine (NORVASC) 5 MG tablet Take 1 tablet (5 mg total) by mouth daily. 30 tablet 5  . aspirin 81 MG tablet Take 1 tablet (81 mg total) by mouth daily. 31 tablet 11  . Calcium Carbonate-Vitamin D (CALCIUM 600+D) 600-400 MG-UNIT per tablet Take 1 tablet by mouth daily. 30 tablet 3  . hydrocortisone cream 1 % Apply topically 2 (two) times daily. 30 g 0  . lisinopril (PRINIVIL,ZESTRIL) 40 MG tablet Take 1 tablet (40 mg total) by mouth daily. 90 tablet 3  . Loperamide-Simethicone (IMODIUM ADVANCED) 2-125 MG TABS Take 1 tablet 2 - 3 times per day as needed for diarrhea     . simvastatin (ZOCOR) 20 MG tablet Take 1 tablet (20 mg total) by mouth at bedtime. 31 tablet 5  . HYDROcodone-acetaminophen (NORCO) 5-325 MG per tablet Take 1 tablet by mouth every 6 (six) hours as needed for moderate pain. 30 tablet 0  . nitroGLYCERIN (NITROSTAT) 0.4 MG SL tablet Place 1 tablet (0.4 mg total) under the tongue every 5 (five) minutes as needed for chest pain. (Patient not taking: Reported on  07/19/2014) 90 tablet 5   No current facility-administered medications for this visit.  , Allergies:  Allergies  Allergen Reactions  . Morphine And Related     Abdominal Pain    Past Medical History, Surgical history, Social history, and Family History were reviewed and updated.   Physical Exam: Blood pressure 140/64, pulse 51, temperature 98.3 F (36.8 C), temperature source Oral, resp. rate 18, height '5\' 5"'  (1.651 m),  weight 118 lb 6.4 oz (53.706 kg), SpO2 100 %. ECOG: 1 General appearance: alert Head: Normocephalic, without obvious abnormality, atraumatic Neck: no adenopathy, no carotid bruit, no JVD, supple, symmetrical, trachea midline and thyroid not enlarged, symmetric, no tenderness/mass/nodules Lymph nodes: Cervical, supraclavicular, and axillary nodes normal. Heart:regular rate and rhythm, S1, S2 normal, no murmur, click, rub or gallop Lung:chest clear, no wheezing, rales, normal symmetric air entry Abdomen: soft, non-tender, without masses or organomegaly EXT:no evidence of joint effusion. She has painful range of motion in her right hip especially with internal and external rotation. Skin: No rash or desquamation.   Lab Results: Lab Results  Component Value Date   WBC 5.9 07/19/2014   HGB 11.2* 07/19/2014   HCT 35.0 07/19/2014   MCV 96.8 07/19/2014   PLT 187 07/19/2014   Results for Kayla, Brooks (MRN [id number]) as of 04/12/2014 11:58  Ref. Range 04/18/2013 15:22 07/19/2013 15:15 10/18/2013 16:07 01/17/2014 10:37  Kappa free light chain Latest Range: 0.33-1.94 mg/dL 47.20 (H) 38.60 (H) 50.90 (H) 57.30 (H)  Lambda Free Lght Chn Latest Range: 0.57-2.63 mg/dL 1.66 1.23 1.32 1.23  Kappa:Lambda Ratio Latest Range: 0.26-1.65  28.43 (H) 31.38 (H) 38.56 (H) 46.59 (H)       Impression and Plan:  A 81 year old female with the following issues. 1. History of a plasmacytoma of the right femur.  She is status post surgical fixation followed by external beam radiation. She is experiencing increased pain in her right hip without any radiographic evidence by x-ray on 10/18/2013. MRI of the hip from 01/2014 did not show any evidence of myeloma.  2. 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  She has been off Revlimid since 03/2012. Her kappa free light chain is under reasonable control. Skeletal survey repeated in 09/2013 and shows no major changes.  Her symptoms are better now off Revlimid. We will follow off on any systemic therapy unless she develops systemic disease. 3. Bony disease.  Continue to be on calcium and vitamin D supplements.  We will hold off on bisphosphonate for the time being. 4. She is advised to follow-up with her orthopedist regarding her increased right hip pain and she believes she has an appointment within the next month. 5. For pain management the patient was given a prescription for Norco 5/325 mg tablets, one tablet by mouth every 6 hours as needed for pain. 6. Follow up in 6 weeks sooner if needed to.      Carlton Adam, PA-C 1/29/20164:16 PM

## 2014-07-19 NOTE — Telephone Encounter (Signed)
Lft msg for pt confirming labs/ov per 01/29 POF, mailed out sch to pt... KJ

## 2014-07-20 ENCOUNTER — Emergency Department (HOSPITAL_COMMUNITY)
Admission: EM | Admit: 2014-07-20 | Discharge: 2014-07-20 | Disposition: A | Discharge status: Home / Self Care | Payer: Medicare Other | Attending: Emergency Medicine | Admitting: Emergency Medicine

## 2014-07-20 ENCOUNTER — Emergency Department (HOSPITAL_COMMUNITY): Payer: Medicare Other

## 2014-07-20 ENCOUNTER — Encounter (HOSPITAL_COMMUNITY): Payer: Self-pay | Admitting: *Deleted

## 2014-07-20 ENCOUNTER — Other Ambulatory Visit: Payer: Self-pay

## 2014-07-20 DIAGNOSIS — Z8579 Personal history of other malignant neoplasms of lymphoid, hematopoietic and related tissues: Secondary | ICD-10-CM | POA: Diagnosis not present

## 2014-07-20 DIAGNOSIS — M1611 Unilateral primary osteoarthritis, right hip: Secondary | ICD-10-CM | POA: Diagnosis not present

## 2014-07-20 DIAGNOSIS — Z862 Personal history of diseases of the blood and blood-forming organs and certain disorders involving the immune mechanism: Secondary | ICD-10-CM | POA: Diagnosis not present

## 2014-07-20 DIAGNOSIS — T7840XA Allergy, unspecified, initial encounter: Secondary | ICD-10-CM | POA: Diagnosis not present

## 2014-07-20 DIAGNOSIS — R0602 Shortness of breath: Secondary | ICD-10-CM | POA: Diagnosis not present

## 2014-07-20 DIAGNOSIS — Z7952 Long term (current) use of systemic steroids: Secondary | ICD-10-CM | POA: Diagnosis not present

## 2014-07-20 DIAGNOSIS — R062 Wheezing: Secondary | ICD-10-CM | POA: Diagnosis not present

## 2014-07-20 DIAGNOSIS — I517 Cardiomegaly: Secondary | ICD-10-CM | POA: Diagnosis not present

## 2014-07-20 DIAGNOSIS — R0600 Dyspnea, unspecified: Secondary | ICD-10-CM | POA: Diagnosis not present

## 2014-07-20 DIAGNOSIS — I10 Essential (primary) hypertension: Secondary | ICD-10-CM | POA: Insufficient documentation

## 2014-07-20 DIAGNOSIS — Z7982 Long term (current) use of aspirin: Secondary | ICD-10-CM | POA: Diagnosis not present

## 2014-07-20 DIAGNOSIS — M25551 Pain in right hip: Secondary | ICD-10-CM | POA: Diagnosis not present

## 2014-07-20 DIAGNOSIS — Z87898 Personal history of other specified conditions: Secondary | ICD-10-CM

## 2014-07-20 DIAGNOSIS — T40605A Adverse effect of unspecified narcotics, initial encounter: Secondary | ICD-10-CM | POA: Insufficient documentation

## 2014-07-20 DIAGNOSIS — Z79899 Other long term (current) drug therapy: Secondary | ICD-10-CM | POA: Insufficient documentation

## 2014-07-20 DIAGNOSIS — T50905A Adverse effect of unspecified drugs, medicaments and biological substances, initial encounter: Secondary | ICD-10-CM

## 2014-07-20 DIAGNOSIS — Z87891 Personal history of nicotine dependence: Secondary | ICD-10-CM | POA: Diagnosis not present

## 2014-07-20 LAB — CBC WITH DIFFERENTIAL/PLATELET
BASOS PCT: 0 % (ref 0–1)
Basophils Absolute: 0 10*3/uL (ref 0.0–0.1)
EOS ABS: 0.1 10*3/uL (ref 0.0–0.7)
Eosinophils Relative: 1 % (ref 0–5)
HEMATOCRIT: 32.8 % — AB (ref 36.0–46.0)
HEMOGLOBIN: 10.7 g/dL — AB (ref 12.0–15.0)
LYMPHS ABS: 1.7 10*3/uL (ref 0.7–4.0)
Lymphocytes Relative: 24 % (ref 12–46)
MCH: 31.2 pg (ref 26.0–34.0)
MCHC: 32.6 g/dL (ref 30.0–36.0)
MCV: 95.6 fL (ref 78.0–100.0)
Monocytes Absolute: 0.4 10*3/uL (ref 0.1–1.0)
Monocytes Relative: 5 % (ref 3–12)
Neutro Abs: 4.9 10*3/uL (ref 1.7–7.7)
Neutrophils Relative %: 70 % (ref 43–77)
Platelets: 196 10*3/uL (ref 150–400)
RBC: 3.43 MIL/uL — ABNORMAL LOW (ref 3.87–5.11)
RDW: 12 % (ref 11.5–15.5)
WBC: 7 10*3/uL (ref 4.0–10.5)

## 2014-07-20 LAB — BASIC METABOLIC PANEL
Anion gap: 10 (ref 5–15)
BUN: 22 mg/dL (ref 6–23)
CALCIUM: 9.2 mg/dL (ref 8.4–10.5)
CHLORIDE: 107 mmol/L (ref 96–112)
CO2: 23 mmol/L (ref 19–32)
CREATININE: 1.6 mg/dL — AB (ref 0.50–1.10)
GFR calc Af Amer: 33 mL/min — ABNORMAL LOW (ref >90)
GFR calc non Af Amer: 29 mL/min — ABNORMAL LOW (ref >90)
Glucose, Bld: 113 mg/dL — ABNORMAL HIGH (ref 70–99)
Potassium: 3.8 mmol/L (ref 3.5–5.1)
SODIUM: 140 mmol/L (ref 135–145)

## 2014-07-20 LAB — I-STAT TROPONIN, ED: TROPONIN I, POC: 0.02 ng/mL (ref 0.00–0.08)

## 2014-07-20 LAB — BRAIN NATRIURETIC PEPTIDE: B Natriuretic Peptide: 134.7 pg/mL — ABNORMAL HIGH (ref 0.0–100.0)

## 2014-07-20 MED ORDER — TRAMADOL HCL 50 MG PO TABS: 50.0000 mg | ORAL_TABLET | Freq: Two times a day (BID) | ORAL | Status: DC | PRN

## 2014-07-20 MED ORDER — TRAMADOL HCL 50 MG PO TABS: 50.0000 mg | ORAL_TABLET | Freq: Four times a day (QID) | ORAL | Status: DC | PRN

## 2014-07-20 NOTE — ED Notes (Signed)
Patient transported to X-ray 

## 2014-07-20 NOTE — Discharge Instructions (Signed)
Please follow up with your primary care physician in 1-2 days. If you do not have one please call the [facility] number listed above. Please have your primary care doctor re-check your kidney function in one week to ensure it goes back to normal level. Please take pain medication and/or muscle relaxants as prescribed and as needed for pain. Please do not drive on narcotic pain medication or on muscle relaxants. Please read all discharge instructions and return precautions.   Shortness of Breath Shortness of breath means you have trouble breathing. It could also mean that you have a medical problem. You should get immediate medical care for shortness of breath. CAUSES   Not enough oxygen in the air such as with high altitudes or a smoke-filled room.  Certain lung diseases, infections, or problems.  Heart disease or conditions, such as angina or heart failure.  Low red blood cells (anemia).  Poor physical fitness, which can cause shortness of breath when you exercise.  Chest or back injuries or stiffness.  Being overweight.  Smoking.  Anxiety, which can make you feel like you are not getting enough air. DIAGNOSIS  Serious medical problems can often be found during your physical exam. Tests may also be done to determine why you are having shortness of breath. Tests may include:  Chest X-rays.  Lung function tests.  Blood tests.  An electrocardiogram (ECG).  An ambulatory electrocardiogram. An ambulatory ECG records your heartbeat patterns over a 24-hour period.  Exercise testing.  A transthoracic echocardiogram (TTE). During echocardiography, sound waves are used to evaluate how blood flows through your heart.  A transesophageal echocardiogram (TEE).  Imaging scans. Your health care provider may not be able to find a cause for your shortness of breath after your exam. In this case, it is important to have a follow-up exam with your health care provider as  directed.  TREATMENT  Treatment for shortness of breath depends on the cause of your symptoms and can vary greatly. HOME CARE INSTRUCTIONS   Do not smoke. Smoking is a common cause of shortness of breath. If you smoke, ask for help to quit.  Avoid being around chemicals or things that may bother your breathing, such as paint fumes and dust.  Rest as needed. Slowly resume your usual activities.  If medicines were prescribed, take them as directed for the full length of time directed. This includes oxygen and any inhaled medicines.  Keep all follow-up appointments as directed by your health care provider. SEEK MEDICAL CARE IF:   Your condition does not improve in the time expected.  You have a hard time doing your normal activities even with rest.  You have any new symptoms. SEEK IMMEDIATE MEDICAL CARE IF:   Your shortness of breath gets worse.  You feel light-headed, faint, or develop a cough not controlled with medicines.  You start coughing up blood.  You have pain with breathing.  You have chest pain or pain in your arms, shoulders, or abdomen.  You have a fever.  You are unable to walk up stairs or exercise the way you normally do. MAKE SURE YOU:  Understand these instructions.  Will watch your condition.  Will get help right away if you are not doing well or get worse. Document Released: 03/02/2001 Document Revised: 06/12/2013 Document Reviewed: 08/23/2011 [facility] Patient Information 2015 [place], [place]. This information is not intended to replace advice given to you by your health care provider. Make sure you discuss any questions  you have with your health care provider.

## 2014-07-20 NOTE — ED Notes (Addendum)
Pt in with family stating that she developed shortness of breath about an hour prior to arrival, pt speaking in full sentences and will randomly gasp for breath, no distress noted, denies pain. Pt was started yesterday on vicodin and family is concerned it isn't agreeing with her.

## 2014-07-20 NOTE — ED Notes (Addendum)
Pt here for sob that happened when she was having a conversation with family members. Pt states she had an episode like this years ago but was not seen by dr for issue. Per family pt had a hard time catching her breath. Pt has had a episode of anxiety attacks and had to go to ED for it. Per family pt has been taking vicodin and last took one 2 hrs ago and has since had the breathing issue. Pt seems to have no distress at this time. Pt talking in complete sentences. Pt denies any cp or any other sx.

## 2014-07-20 NOTE — ED Provider Notes (Signed)
CSN: [id number]     Arrival date & time 07/20/14  1857 History   First MD Initiated Contact with Patient 07/20/14 1931     Chief Complaint  Patient presents with  . Shortness of Breath     (Consider location/radiation/quality/duration/timing/severity/associated sxs/prior Treatment) HPI Comments: Patient is an 81 yo F PMHx significant for HTN, h/o MM in remission presenting to the ED for an instance of shortness of breath that occurred after taking a Norco. Patient states she had one episode yesterday after taking her first Norco for arthritis pain that resolved, and had a second episode this evening around 4PM. She states the symptoms eventually resolved, but she describes having a hard time catching her breath and felt very out of it. The patient's sons describe that she also seemed "out of it." They state she has never had Norco before and had difficulty breathing with previous use of Morphine. Patient denies any CP, cough, fevers, chills, cessation of breathing, episodes of cyanosis, syncope. She also states she was seen by her PCP a few days ago for increased R hip pain without fall or trauma and had no imaging.    Past Medical History  Diagnosis Date  . Multiple myeloma without mention of remission   . Monoclonal paraproteinemia   . Hypertension    Past Surgical History  Procedure Laterality Date  . Multiple mye     History reviewed. No pertinent family history. History  Substance Use Topics  . Smoking status: Former Research scientist (life sciences)  . Smokeless tobacco: Not on file  . Alcohol Use: No   OB History    No data available     Review of Systems  Respiratory: Positive for shortness of breath.   All other systems reviewed and are negative.     Allergies  Morphine and related  Home Medications   Prior to Admission medications   Medication Sig Start Date End Date Taking? Authorizing Provider  amLODipine (NORVASC) 5 MG tablet Take 1 tablet (5 mg total) by mouth daily. 04/17/14  Yes  Madilyn Fireman, MD  aspirin 81 MG tablet Take 1 tablet (81 mg total) by mouth daily. 09/08/12  Yes Trish Fountain, MD  lisinopril (PRINIVIL,ZESTRIL) 40 MG tablet Take 1 tablet (40 mg total) by mouth daily. 09/20/13  Yes Karren Cobble, MD  Loperamide-Simethicone (IMODIUM ADVANCED) 2-125 MG TABS Take 1 tablet 2 - 3 times per day as needed for diarrhea    Yes Historical Provider, MD  nitroGLYCERIN (NITROSTAT) 0.4 MG SL tablet Place 1 tablet (0.4 mg total) under the tongue every 5 (five) minutes as needed for chest pain. 10/02/12  Yes Emory Carollee Leitz, MD  simvastatin (ZOCOR) 20 MG tablet Take 1 tablet (20 mg total) by mouth at bedtime.   Yes Madilyn Fireman, MD  Calcium Carbonate-Vitamin D (CALCIUM 600+D) 600-400 MG-UNIT per tablet Take 1 tablet by mouth daily. Patient not taking: Reported on 07/20/2014 04/06/13   Madilyn Fireman, MD  hydrocortisone cream 1 % Apply topically 2 (two) times daily. Patient not taking: Reported on 07/20/2014 01/17/13   Wyatt Portela, MD  traMADol (ULTRAM) 50 MG tablet Take 1 tablet (50 mg total) by mouth every 12 (twelve) hours as needed for severe pain. 07/20/14   Jennifer L Piepenbrink, PA-C   BP 141/80 mmHg  Pulse 72  Temp(Src) 98.1 F (36.7 C) (Oral)  Resp 14  SpO2 100% Physical Exam  Constitutional: She is oriented to person, place, and time. She appears well-developed and well-nourished. No distress.  HENT:  Head: Normocephalic and atraumatic.  Right Ear: External ear normal.  Left Ear: External ear normal.  Nose: Nose normal.  Mouth/Throat: Oropharynx is clear and moist. No oropharyngeal exudate.  Eyes: Conjunctivae are normal.  Neck: Normal range of motion. Neck supple.  Cardiovascular: Normal rate, regular rhythm and normal heart sounds.   Pulmonary/Chest: Effort normal and breath sounds normal. No respiratory distress.  Talking in complete sentences without distress.   Abdominal: Soft. There is no tenderness.  Musculoskeletal: Normal range of  motion. She exhibits no edema.  Neurological: She is alert and oriented to person, place, and time.  Skin: Skin is warm and dry. She is not diaphoretic.  Psychiatric: She has a normal mood and affect.  Nursing note and vitals reviewed.   ED Course  Procedures (including critical care time) Medications - No data to display  Labs Review Labs Reviewed  BASIC METABOLIC PANEL - Abnormal; Notable for the following:    Glucose, Bld 113 (*)    Creatinine, Ser 1.60 (*)    GFR calc non Af Amer 29 (*)    GFR calc Af Amer 33 (*)    All other components within normal limits  CBC WITH DIFFERENTIAL/PLATELET - Abnormal; Notable for the following:    RBC 3.43 (*)    Hemoglobin 10.7 (*)    HCT 32.8 (*)    All other components within normal limits  BRAIN NATRIURETIC PEPTIDE - Abnormal; Notable for the following:    B Natriuretic Peptide 134.7 (*)    All other components within normal limits  I-STAT TROPOININ, ED    Imaging Review Dg Chest 2 View  07/20/2014   CLINICAL DATA:  Dyspnea and wheezing today.  EXAM: CHEST  2 VIEW  COMPARISON:  10/18/2013  FINDINGS: There is moderate unchanged cardiomegaly. There is unchanged pulmonary hyperinflation. There is mild benign apical pleural thickening bilaterally, unchanged. No acute infiltrate or congestive heart failure is evident. There are no pleural effusions.  IMPRESSION: Unchanged hyperinflation and cardiomegaly.  No acute findings.   Electronically Signed   By: Andreas Newport M.D.   On: 07/20/2014 21:11   Dg Hip Unilat With Pelvis 2-3 Views Right  07/20/2014   CLINICAL DATA:  Right hip pain radiating to right knee after falling down steps 2 months ago.  EXAM: DG HIP W/ PELVIS 2-3V*R*  COMPARISON:  None.  FINDINGS: There is an intra medullary right femoral nail with interlocking femoral neck fixation. The distal portion of the femoral fixation hardware is not included within the field of view of these images. The visible portions are intact. There is  no fracture or dislocation. There is moderately severe joint space narrowing with mild subchondral sclerosis about the right hip joint, consistent with arthritis. There are old calcifications superior to the greater trochanter which likely reflect sequela of prior surgery and trauma. No bone lesion or bony destruction is evident.  IMPRESSION: Negative for fracture, dislocation or hardware failure, although the distal portions of the femoral intra medullary nail are not included on this study.  Moderately severe right hip arthritis   Electronically Signed   By: Andreas Newport M.D.   On: 07/20/2014 21:14     EKG Interpretation None      MDM   Final diagnoses:  Hip pain, right  Medication reaction, initial encounter  H/O shortness of breath    Filed Vitals:   07/20/14 2200  BP: 141/80  Pulse: 72  Temp:   Resp: 14   Afebrile, NAD, non-toxic appearing, AAOx4.  I have reviewed nursing notes, vital signs, and all appropriate lab and imaging results for this patient. Patient resting comfortably. No signs of respiratory distress. Lungs clear to auscultation bilaterally. No tachycardia, tachypnea, or hypoxia. No extremity edema. Symptom free while in the ED. Labs reviewed, mild increase in Creatinine. Patient tolerating PO intake, advised re-check in one week. Symptoms likely not cardiac or pulmonary in etiology. Advised to discontinue norco as likely an adverse reaction. Will switch to Tramadol for pain control. Return precautions discussed. Patient is agreeable to plan. Patient is stable at time of discharge. Patient d/w with Dr. Tomi Bamberger, agrees with plan.    Harlow Mares, PA-C 07/20/14 2334  Dorie Rank, MD 07/24/14 [phone]

## 2014-07-22 NOTE — Patient Instructions (Signed)
See her orthopedist as soon as possible regarding her increased right hip pain Follow-up in 6 weeks

## 2014-07-24 LAB — SPEP & IFE WITH QIG
ALPHA-2-GLOBULIN: 10.6 % (ref 7.1–11.8)
Albumin ELP: 55.7 % — ABNORMAL LOW (ref 55.8–66.1)
Alpha-1-Globulin: 4.8 % (ref 2.9–4.9)
BETA GLOBULIN: 5.6 % (ref 4.7–7.2)
Beta 2: 4.2 % (ref 3.2–6.5)
Gamma Globulin: 19.1 % — ABNORMAL HIGH (ref 11.1–18.8)
IGA: 63 mg/dL — AB (ref 69–380)
IGM, SERUM: 8 mg/dL — AB (ref 52–322)
IgG (Immunoglobin G), Serum: 569 mg/dL — ABNORMAL LOW (ref 690–1700)
M-Spike, %: 0.8 g/dL
Total Protein, Serum Electrophoresis: 6.8 g/dL (ref 6.0–8.3)

## 2014-07-24 LAB — KAPPA/LAMBDA LIGHT CHAINS
KAPPA LAMBDA RATIO: 463.33 — AB (ref 0.26–1.65)
Kappa free light chain: 69.5 mg/dL — ABNORMAL HIGH (ref 0.33–1.94)
Lambda Free Lght Chn: 0.15 mg/dL — ABNORMAL LOW (ref 0.57–2.63)

## 2014-08-26 ENCOUNTER — Other Ambulatory Visit: Payer: Self-pay | Admitting: *Deleted

## 2014-08-26 DIAGNOSIS — I10 Essential (primary) hypertension: Secondary | ICD-10-CM

## 2014-08-26 MED ORDER — LISINOPRIL 40 MG PO TABS: 40.0000 mg | ORAL_TABLET | Freq: Every day | ORAL | Status: DC

## 2014-08-30 ENCOUNTER — Telehealth: Payer: Self-pay | Admitting: Oncology

## 2014-08-30 ENCOUNTER — Ambulatory Visit: Payer: Medicare Other | Admitting: Oncology

## 2014-08-30 ENCOUNTER — Other Ambulatory Visit: Payer: Medicare Other

## 2014-08-30 NOTE — Telephone Encounter (Signed)
Confirm appointment for 03/24.

## 2014-09-12 ENCOUNTER — Telehealth: Payer: Self-pay | Admitting: Oncology

## 2014-09-12 ENCOUNTER — Ambulatory Visit (HOSPITAL_BASED_OUTPATIENT_CLINIC_OR_DEPARTMENT_OTHER): Payer: Medicare Other | Admitting: Oncology

## 2014-09-12 ENCOUNTER — Other Ambulatory Visit (HOSPITAL_BASED_OUTPATIENT_CLINIC_OR_DEPARTMENT_OTHER): Payer: Medicare Other

## 2014-09-12 VITALS — BP 161/62 | HR 60 | Temp 98.0°F | Resp 18 | Ht 65.0 in | Wt 119.8 lb

## 2014-09-12 DIAGNOSIS — C9000 Multiple myeloma not having achieved remission: Secondary | ICD-10-CM

## 2014-09-12 DIAGNOSIS — M25551 Pain in right hip: Secondary | ICD-10-CM | POA: Diagnosis not present

## 2014-09-12 DIAGNOSIS — M899 Disorder of bone, unspecified: Secondary | ICD-10-CM | POA: Diagnosis not present

## 2014-09-12 LAB — CBC WITH DIFFERENTIAL/PLATELET
BASO%: 0.8 % (ref 0.0–2.0)
BASOS ABS: 0 10*3/uL (ref 0.0–0.1)
EOS%: 1.2 % (ref 0.0–7.0)
Eosinophils Absolute: 0.1 10*3/uL (ref 0.0–0.5)
HCT: 34.6 % — ABNORMAL LOW (ref 34.8–46.6)
HEMOGLOBIN: 11.1 g/dL — AB (ref 11.6–15.9)
LYMPH%: 26.9 % (ref 14.0–49.7)
MCH: 30.7 pg (ref 25.1–34.0)
MCHC: 32.1 g/dL (ref 31.5–36.0)
MCV: 95.6 fL (ref 79.5–101.0)
MONO#: 0.4 10*3/uL (ref 0.1–0.9)
MONO%: 6.4 % (ref 0.0–14.0)
NEUT%: 64.7 % (ref 38.4–76.8)
NEUTROS ABS: 3.8 10*3/uL (ref 1.5–6.5)
PLATELETS: 172 10*3/uL (ref 145–400)
RBC: 3.62 10*6/uL — AB (ref 3.70–5.45)
RDW: 12.8 % (ref 11.2–14.5)
WBC: 5.8 10*3/uL (ref 3.9–10.3)
lymph#: 1.6 10*3/uL (ref 0.9–3.3)

## 2014-09-12 LAB — COMPREHENSIVE METABOLIC PANEL (CC13)
ALK PHOS: 83 U/L (ref 40–150)
ALT: 17 U/L (ref 0–55)
ANION GAP: 11 meq/L (ref 3–11)
AST: 19 U/L (ref 5–34)
Albumin: 3.6 g/dL (ref 3.5–5.0)
BILIRUBIN TOTAL: 0.64 mg/dL (ref 0.20–1.20)
BUN: 17.1 mg/dL (ref 7.0–26.0)
CHLORIDE: 106 meq/L (ref 98–109)
CO2: 23 mEq/L (ref 22–29)
CREATININE: 0.8 mg/dL (ref 0.6–1.1)
Calcium: 9.1 mg/dL (ref 8.4–10.4)
EGFR: 81 mL/min/{1.73_m2} — AB (ref >90)
GLUCOSE: 87 mg/dL (ref 70–140)
Potassium: 4.1 mEq/L (ref 3.5–5.1)
SODIUM: 140 meq/L (ref 136–145)
Total Protein: 6.7 g/dL (ref 6.4–8.3)

## 2014-09-12 MED ORDER — OXYCODONE HCL 5 MG PO TABS: 5.0000 mg | ORAL_TABLET | ORAL | Status: DC | PRN

## 2014-09-12 NOTE — Progress Notes (Signed)
Hematology and Oncology Follow Up Visit  Kayla Brooks [id number] 1930-02-08 81 y.o. 09/12/2014 3:04 PM  CC: Kayla Ser, MD  Kayla Brooks. Kayla Blalock, MD, [facility]  Kayla Brooks, M.D., Ph.D.    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  She had a light chain multiple myeloma with M spike of about 0.35 gm/dL.  Prior Therapy: 1. Status post prophylactic fixation impending pathological fracture of the right femur done in August 2009. 2. External beam radiation concluded in 2010 at the right femur. 3. Revlimid 25 mg daily as maintenance therapy for 2 years ending 03/2012.  Current therapy:  Watchful observation.  Interim History:  Kayla Brooks presents today for a followup visit. Since her last visit, she has reported increased pain in her right hip. Her pain is wall bearing weight as well as at rest. She is able to sleep without any difficulty. Has not had any major changes in her pain quality with pain medication. Pain is not radiating and not associated with any neurological symptoms. Her ability to perform her activities of daily living is decreased secondary to the pain.  She is still able to perform activities of daily living including public transportations to get to her appointments. Otherwise she is asymptomatic. She had not had any nausea, not had any abdominal distension.  For the most part, performance status and activity level are decreased from baseline. Has not had any pathological fractures.   Her appetite is still good. She reports no recent hospitalizations or illnesses. Has not reported any recurrent sinopulmonary infections.   She has not had any falls or fractures. She has not reported any headaches or blurry vision. Has not reported any chest pain or shortness of breath. Is not reporting any change in her bowel habits. Remainder of her review  of systems unremarkable.   Medications: I have reviewed the patient's current medications.  Current Outpatient Prescriptions  Medication Sig Dispense Refill  . amLODipine (NORVASC) 5 MG tablet Take 1 tablet (5 mg total) by mouth daily. 30 tablet 5  . aspirin 81 MG tablet Take 1 tablet (81 mg total) by mouth daily. 31 tablet 11  . Calcium Carbonate-Vitamin D (CALCIUM 600+D) 600-400 MG-UNIT per tablet Take 1 tablet by mouth daily. 30 tablet 3  . hydrocortisone cream 1 % Apply topically 2 (two) times daily. 30 g 0  . lisinopril (PRINIVIL,ZESTRIL) 40 MG tablet Take 1 tablet (40 mg total) by mouth daily. 90 tablet 3  . Loperamide-Simethicone (IMODIUM ADVANCED) 2-125 MG TABS Take 1 tablet 2 - 3 times per day as needed for diarrhea     . nitroGLYCERIN (NITROSTAT) 0.4 MG SL tablet Place 1 tablet (0.4 mg total) under the tongue every 5 (five) minutes as needed for chest pain. 90 tablet 5  . simvastatin (ZOCOR) 20 MG tablet Take 1 tablet (20 mg total) by mouth at bedtime. 31 tablet 5   No current facility-administered medications for this visit.  , Allergies:  Allergies  Allergen Reactions  . Morphine And Related     Abdominal Pain    Past Medical History, Surgical history, Social history, and Family History were reviewed and updated.   Physical Exam: Blood pressure 161/62, pulse 60, temperature 98 F (36.7 C), temperature source Oral, resp. rate 18, height _0  (1.651 m), weight 119 lb 12.8 oz (54.341 kg), SpO2 99 %. ECOG: 1  General appearance: alert Head: Normocephalic, without obvious abnormality, atraumatic Neck: no adenopathy, no carotid bruit, no JVD, supple, symmetrical, trachea midline and thyroid not enlarged, symmetric, no tenderness/mass/nodules Lymph nodes: Cervical, supraclavicular, and axillary nodes normal. Heart:regular rate and rhythm, S1, S2 normal, no murmur, click, rub or gallop Lung:chest clear, no wheezing, rales, normal symmetric air entry Abdomen: soft, non-tender,  without masses or organomegaly EXT:no evidence of joint effusion. She has painful range of motion in her right hip especially with internal and external rotation. Skin: No rash or desquamation.   Lab Results: Lab Results  Component Value Date   WBC 5.8 09/12/2014   HGB 11.1* 09/12/2014   HCT 34.6* 09/12/2014   MCV 95.6 09/12/2014   PLT 172 09/12/2014      Results for Kayla, Brooks (MRN [id number]) as of 09/12/2014 14:45  Ref. Range 01/17/2014 10:37 04/12/2014 10:27 07/19/2014 13:13  Kappa free light chain Latest Range: 0.33-1.94 mg/dL 57.30 (H) 58.50 (H) 69.50 (H)  Lambda Free Lght Chn Latest Range: 0.57-2.63 mg/dL 1.23 0.84 0.15 (L)  Kappa:Lambda Ratio Latest Range: 0.26-1.65  46.59 (H) 69.64 (H) 463.33 (H)     Impression and Plan:  A 81 year old female with the following issues. 1. History of a plasmacytoma of the right femur.  She is status post surgical fixation followed by external beam radiation. She is experiencing increased pain in her right hip without any radiographic evidence by x-ray on 10/18/2013. MRI of the hip from 01/2014 did not show any evidence of myeloma.  2. 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  She has been off Revlimid since 03/2012. Her kappa free light chain has been increasing as of late and could be contributing to her hip pain for possible disease progression. The plan is to restage her with repeat laboratory testing today, skeletal survey and I discussed with her restarting systemic therapy. Therapy will be the form of either a Revlimid based her Velcade based regimens. After discussing the risks and benefits of both approaches she have preferred to proceed with oral medication and she had a reasonable response to Revlimid. I plan on restarting that in the near future if she has laboratory or radiographic evidence of disease progression. 3. Bony disease.  Continue to be on calcium and vitamin D  supplements.  We will considering starting bisphosphonates and her disease had progressed. 4. Right hip pain: I will obtain an MRI of the hip to rule out any pathological fracture or any abnormalities. She has seen Dr. Redmond Pulling I [facility] and we'll refer her there if needed to. 5. Follow up in 6 weeks sooner if needed to.      [facility], MD 3/24/20163:04 PM

## 2014-09-12 NOTE — Addendum Note (Signed)
Addended by: Wyatt Portela on: 09/12/2014 03:24 PM   Modules accepted: Orders

## 2014-09-12 NOTE — Telephone Encounter (Signed)
Gave avs & calendar for April/May. °

## 2014-09-16 LAB — SPEP & IFE WITH QIG
ABNORMAL PROTEIN BAND1: 0.9 g/dL
ALBUMIN ELP: 3.6 g/dL — AB (ref 3.8–4.8)
Alpha-1-Globulin: 0.3 g/dL (ref 0.2–0.3)
Alpha-2-Globulin: 0.7 g/dL (ref 0.5–0.9)
BETA GLOBULIN: 0.4 g/dL (ref 0.4–0.6)
Beta 2: 0.3 g/dL (ref 0.2–0.5)
GAMMA GLOBULIN: 1.2 g/dL (ref 0.8–1.7)
IGA: 51 mg/dL — AB (ref 69–380)
IGG (IMMUNOGLOBIN G), SERUM: 506 mg/dL — AB (ref 690–1700)
IgM, Serum: 7 mg/dL — ABNORMAL LOW (ref 52–322)
Total Protein, Serum Electrophoresis: 6.4 g/dL (ref 6.1–8.1)

## 2014-09-16 LAB — KAPPA/LAMBDA LIGHT CHAINS
KAPPA FREE LGHT CHN: 83 mg/dL — AB (ref 0.33–1.94)
KAPPA LAMBDA RATIO: 286.21 — AB (ref 0.26–1.65)
LAMBDA FREE LGHT CHN: 0.29 mg/dL — AB (ref 0.57–2.63)

## 2014-09-23 ENCOUNTER — Ambulatory Visit (HOSPITAL_COMMUNITY)
Admission: RE | Admit: 2014-09-23 | Discharge: 2014-09-23 | Disposition: A | Discharge status: Home / Self Care | Payer: Medicare Other | Source: Ambulatory Visit | Attending: Oncology | Admitting: Oncology

## 2014-09-23 DIAGNOSIS — Z4789 Encounter for other orthopedic aftercare: Secondary | ICD-10-CM | POA: Diagnosis not present

## 2014-09-23 DIAGNOSIS — C9000 Multiple myeloma not having achieved remission: Secondary | ICD-10-CM

## 2014-09-23 DIAGNOSIS — M25451 Effusion, right hip: Secondary | ICD-10-CM | POA: Diagnosis not present

## 2014-09-23 DIAGNOSIS — M25551 Pain in right hip: Secondary | ICD-10-CM | POA: Diagnosis not present

## 2014-09-23 DIAGNOSIS — M79651 Pain in right thigh: Secondary | ICD-10-CM | POA: Diagnosis not present

## 2014-09-23 MED ORDER — GADOBENATE DIMEGLUMINE 529 MG/ML IV SOLN
10.0000 mL | Freq: Once | INTRAVENOUS | Status: AC | PRN
  Administered 2014-09-23: 10 mL via INTRAVENOUS

## 2014-10-17 ENCOUNTER — Other Ambulatory Visit: Payer: Medicare Other

## 2014-10-17 ENCOUNTER — Ambulatory Visit: Payer: Medicare Other | Admitting: Oncology

## 2014-10-17 ENCOUNTER — Other Ambulatory Visit (HOSPITAL_BASED_OUTPATIENT_CLINIC_OR_DEPARTMENT_OTHER): Payer: Medicare Other

## 2014-10-17 DIAGNOSIS — C9000 Multiple myeloma not having achieved remission: Secondary | ICD-10-CM

## 2014-10-17 DIAGNOSIS — M25551 Pain in right hip: Secondary | ICD-10-CM | POA: Diagnosis not present

## 2014-10-17 DIAGNOSIS — M25559 Pain in unspecified hip: Secondary | ICD-10-CM | POA: Diagnosis not present

## 2014-10-17 LAB — CBC WITH DIFFERENTIAL/PLATELET
BASO%: 0.5 % (ref 0.0–2.0)
Basophils Absolute: 0 10*3/uL (ref 0.0–0.1)
EOS%: 0.8 % (ref 0.0–7.0)
Eosinophils Absolute: 0 10*3/uL (ref 0.0–0.5)
HEMATOCRIT: 34.3 % — AB (ref 34.8–46.6)
HGB: 11.1 g/dL — ABNORMAL LOW (ref 11.6–15.9)
LYMPH%: 21.1 % (ref 14.0–49.7)
MCH: 30.9 pg (ref 25.1–34.0)
MCHC: 32.3 g/dL (ref 31.5–36.0)
MCV: 95.7 fL (ref 79.5–101.0)
MONO#: 0.3 10*3/uL (ref 0.1–0.9)
MONO%: 6.1 % (ref 0.0–14.0)
NEUT#: 4 10*3/uL (ref 1.5–6.5)
NEUT%: 71.5 % (ref 38.4–76.8)
Platelets: 194 10*3/uL (ref 145–400)
RBC: 3.58 10*6/uL — ABNORMAL LOW (ref 3.70–5.45)
RDW: 12.4 % (ref 11.2–14.5)
WBC: 5.6 10*3/uL (ref 3.9–10.3)
lymph#: 1.2 10*3/uL (ref 0.9–3.3)

## 2014-10-17 LAB — COMPREHENSIVE METABOLIC PANEL (CC13)
ALT: 15 U/L (ref 0–55)
AST: 22 U/L (ref 5–34)
Albumin: 3.6 g/dL (ref 3.5–5.0)
Alkaline Phosphatase: 96 U/L (ref 40–150)
Anion Gap: 14 mEq/L — ABNORMAL HIGH (ref 3–11)
BILIRUBIN TOTAL: 0.74 mg/dL (ref 0.20–1.20)
BUN: 18.9 mg/dL (ref 7.0–26.0)
CALCIUM: 9.1 mg/dL (ref 8.4–10.4)
CHLORIDE: 109 meq/L (ref 98–109)
CO2: 20 meq/L — AB (ref 22–29)
Creatinine: 1.1 mg/dL (ref 0.6–1.1)
EGFR: 56 mL/min/{1.73_m2} — ABNORMAL LOW (ref >90)
Glucose: 95 mg/dl (ref 70–140)
Potassium: 4 mEq/L (ref 3.5–5.1)
SODIUM: 143 meq/L (ref 136–145)
TOTAL PROTEIN: 6.8 g/dL (ref 6.4–8.3)

## 2014-10-18 LAB — KAPPA/LAMBDA LIGHT CHAINS
Kappa free light chain: 95.4 mg/dL — ABNORMAL HIGH (ref 0.33–1.94)
Kappa:Lambda Ratio: 106 — ABNORMAL HIGH (ref 0.26–1.65)
Lambda Free Lght Chn: 0.9 mg/dL (ref 0.57–2.63)

## 2014-10-22 ENCOUNTER — Other Ambulatory Visit: Payer: Self-pay | Admitting: *Deleted

## 2014-10-22 DIAGNOSIS — I10 Essential (primary) hypertension: Secondary | ICD-10-CM

## 2014-10-22 DIAGNOSIS — C9000 Multiple myeloma not having achieved remission: Secondary | ICD-10-CM

## 2014-10-22 MED ORDER — AMLODIPINE BESYLATE 5 MG PO TABS: 5.0000 mg | ORAL_TABLET | Freq: Every day | ORAL | Status: DC

## 2014-10-23 ENCOUNTER — Ambulatory Visit (HOSPITAL_BASED_OUTPATIENT_CLINIC_OR_DEPARTMENT_OTHER): Payer: Medicare Other | Admitting: Oncology

## 2014-10-23 ENCOUNTER — Telehealth: Payer: Self-pay | Admitting: Oncology

## 2014-10-23 ENCOUNTER — Other Ambulatory Visit (HOSPITAL_BASED_OUTPATIENT_CLINIC_OR_DEPARTMENT_OTHER): Payer: Medicare Other

## 2014-10-23 VITALS — BP 138/62 | HR 54 | Temp 98.2°F | Resp 18 | Ht 65.0 in | Wt 119.7 lb

## 2014-10-23 DIAGNOSIS — C9000 Multiple myeloma not having achieved remission: Secondary | ICD-10-CM

## 2014-10-23 LAB — CBC WITH DIFFERENTIAL/PLATELET
BASO%: 0.3 % (ref 0.0–2.0)
Basophils Absolute: 0 10e3/uL (ref 0.0–0.1)
EOS%: 1.1 % (ref 0.0–7.0)
Eosinophils Absolute: 0.1 10e3/uL (ref 0.0–0.5)
HCT: 33.6 % — ABNORMAL LOW (ref 34.8–46.6)
HGB: 11 g/dL — ABNORMAL LOW (ref 11.6–15.9)
LYMPH%: 19 % (ref 14.0–49.7)
MCH: 31.4 pg (ref 25.1–34.0)
MCHC: 32.7 g/dL (ref 31.5–36.0)
MCV: 96.1 fL (ref 79.5–101.0)
MONO#: 0.3 10e3/uL (ref 0.1–0.9)
MONO%: 4.8 % (ref 0.0–14.0)
NEUT#: 4.8 10e3/uL (ref 1.5–6.5)
NEUT%: 74.8 % (ref 38.4–76.8)
Platelets: 157 10e3/uL (ref 145–400)
RBC: 3.49 10e6/uL — ABNORMAL LOW (ref 3.70–5.45)
RDW: 12.5 % (ref 11.2–14.5)
WBC: 6.5 10e3/uL (ref 3.9–10.3)
lymph#: 1.2 10e3/uL (ref 0.9–3.3)

## 2014-10-23 LAB — COMPREHENSIVE METABOLIC PANEL (CC13)
ALT: 14 U/L (ref 0–55)
ANION GAP: 10 meq/L (ref 3–11)
AST: 18 U/L (ref 5–34)
Albumin: 3.6 g/dL (ref 3.5–5.0)
Alkaline Phosphatase: 93 U/L (ref 40–150)
BILIRUBIN TOTAL: 0.93 mg/dL (ref 0.20–1.20)
BUN: 23.4 mg/dL (ref 7.0–26.0)
CHLORIDE: 110 meq/L — AB (ref 98–109)
CO2: 24 mEq/L (ref 22–29)
Calcium: 9.2 mg/dL (ref 8.4–10.4)
Creatinine: 0.9 mg/dL (ref 0.6–1.1)
EGFR: 71 mL/min/{1.73_m2} — AB (ref >90)
Glucose: 95 mg/dl (ref 70–140)
Potassium: 4.2 mEq/L (ref 3.5–5.1)
Sodium: 144 mEq/L (ref 136–145)
Total Protein: 6.8 g/dL (ref 6.4–8.3)

## 2014-10-23 NOTE — Telephone Encounter (Signed)
per pof to sch pt appt-gave pt copy of sch °

## 2014-10-23 NOTE — Progress Notes (Signed)
Hematology and Oncology Follow Up Visit  Kayla Brooks [id number] Jul 02, 1929 81 y.o. 10/23/2014 3:25 PM  CC: Kayla Ser, MD  Kayla Brooks. Kayla Blalock, MD, [facility]  Kayla Brooks, M.D., Ph.D.    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  She had a light chain multiple myeloma with M spike of about 0.35 gm/dL.  Prior Therapy: 1. Status post prophylactic fixation impending pathological fracture of the right femur done in August 2009. 2. External beam radiation concluded in 2010 at the right femur. 3. Revlimid 25 mg daily as maintenance therapy for 2 years ending 03/2012.  Current therapy:  Watchful observation.  Interim History:  Kayla Brooks presents today for a followup visit. Since her last visit, she continues to report intermittent right-sided hip pain. And has not increased in intensity or frequency. She takes oxycodone very infrequently at this time. She is able to ambulate without any major difficulties. She has not had any falls or instability. She still able to use public transportation to make her appointments and continues to live independently. Has not had any major changes in her pain quality.  She had not had any nausea, not had any abdominal distension. Has not had any pathological fractures.  Her appetite is still good. She reports no recent hospitalizations or illnesses. Has not reported any recurrent sinopulmonary infections. She has not reported any headaches or blurry vision. Has not reported any chest pain or shortness of breath. Is not reporting any change in her bowel habits. She has not reported any mood changes or depression. Remainder of her review of systems unremarkable.   Medications: I have reviewed the patient's current medications.  Current Outpatient Prescriptions  Medication Sig Dispense Refill  . amLODipine (NORVASC) 5 MG  tablet Take 1 tablet (5 mg total) by mouth daily. 90 tablet 3  . aspirin 81 MG tablet Take 1 tablet (81 mg total) by mouth daily. 31 tablet 11  . Calcium Carbonate-Vitamin D (CALCIUM 600+D) 600-400 MG-UNIT per tablet Take 1 tablet by mouth daily. 30 tablet 3  . hydrocortisone cream 1 % Apply topically 2 (two) times daily. 30 g 0  . lisinopril (PRINIVIL,ZESTRIL) 40 MG tablet Take 1 tablet (40 mg total) by mouth daily. 90 tablet 3  . Loperamide-Simethicone (IMODIUM ADVANCED) 2-125 MG TABS Take 1 tablet 2 - 3 times per day as needed for diarrhea     . nitroGLYCERIN (NITROSTAT) 0.4 MG SL tablet Place 1 tablet (0.4 mg total) under the tongue every 5 (five) minutes as needed for chest pain. 90 tablet 5  . oxyCODONE (OXY IR/ROXICODONE) 5 MG immediate release tablet Take 1 tablet (5 mg total) by mouth every 4 (four) hours as needed for severe pain. 30 tablet 0  . simvastatin (ZOCOR) 20 MG tablet Take 1 tablet (20 mg total) by mouth at bedtime. 31 tablet 5   No current facility-administered medications for this visit.  , Allergies:  Allergies  Allergen Reactions  . Morphine And Related Other (See Comments)    Abdominal Pain    Past Medical History, Surgical history, Social history, and Family History were reviewed and updated.   Physical Exam: Blood pressure 138/62, pulse 54, temperature 98.2 F (36.8 C), temperature source Oral, resp. rate 18, height '5\' 5"'  (1.651 m), weight 119 lb 11.2 oz (54.296 kg). ECOG: 1 General appearance: alert Head: Normocephalic, without obvious abnormality, atraumatic  Neck: no adenopathy, no carotid bruit, no JVD, supple, symmetrical, trachea midline and thyroid not enlarged, symmetric, no tenderness/mass/nodules Lymph nodes: Cervical, supraclavicular, and axillary nodes normal. Heart:regular rate and rhythm, S1, S2 normal, no murmur, click, rub or gallop Lung:chest clear, no wheezing, rales, normal symmetric air entry Abdomen: soft, non-tender, without masses or  organomegaly EXT:no evidence of joint effusion. She has a reasonable range of motion in her right hip. Skin: No rash or desquamation.   Lab Results: Lab Results  Component Value Date   WBC 6.5 10/23/2014   HGB 11.0* 10/23/2014   HCT 33.6* 10/23/2014   MCV 96.1 10/23/2014   PLT 157 10/23/2014     Results for [name], Kayla Brooks (MRN [id number]) as of 10/23/2014 15:13  Ref. Range 07/19/2014 13:13 09/12/2014 14:33 10/17/2014 13:31  Kappa free light chain Latest Ref Range: 0.33-1.94 mg/dL 69.50 (H) 83.00 (H) 95.40 (H)  Lambda Free Lght Chn Latest Ref Range: 0.57-2.63 mg/dL 0.15 (L) 0.29 (L) 0.90  Kappa:Lambda Ratio Latest Ref Range: 0.26-1.65  463.33 (H) 286.21 (H) 106.00 (H)      FINDINGS: Images reveal cervical spondylosis ; aortic atherosclerotic calcifications; thoracic spondylosis; is a calcified gallstone ; lower lumbar spondylosis and degenerative disc disease; right hip intramedullary nail with helical femoral neck screw, and severe chondral thinning in the right hip. No myeloma lesions identified.  IMPRESSION: 1. No myeloma lesions identified. 2. Various ancillary findings on the 24 distinct images obtained today, as noted above.  EXAM: MRI OF THE RIGHT HIP WITHOUT AND WITH CONTRAST  TECHNIQUE: Multiplanar, multisequence MR imaging was performed both before and after administration of intravenous contrast.  CONTRAST: 23m MULTIHANCE GADOBENATE DIMEGLUMINE 529 MG/ML IV SOLN  COMPARISON: 01/23/2014  FINDINGS: Bones: Significant artifact related to the right hip hardware. No obvious complicating features. There are slightly progressive degenerative changes involving the right hip with joint space narrowing, osteophytic spurring and areas of subchondral cystic change. There is also a moderate-sized right hip joint effusion and trochanteric bursitis. The left hip also demonstrates moderate degenerative changes and a small joint effusion.  The pubic symphysis and  SI joints are intact. Mild degenerative changes. Persistent speckled appearing marrow pattern likely due to myeloma.  The hip and pelvic musculature are grossly normal. No obvious muscle tear or myositis.  No significant intrapelvic abnormalities are demonstrated. No inguinal mass or hernia.  Articular cartilage and labrum  Articular cartilage:  Labrum:  Joint or bursal effusion  Joint effusion:  Bursae:  Muscles and tendons  Muscles and tendons:  Other findings  Miscellaneous:  IMPRESSION: 1. Stable right hip/femur hardware. No definite complicating features. Moderate artifact. 2. Progressive degenerative changes involving the right hip joint and persistent joint effusion. There is also stable trochanteric bursitis. Stable degenerative changes involving the left hip. 3. No findings for fracture or AVN. 4. Speckled marrow pattern likely due to multiple myeloma.     Impression and Plan:  A 81year old female with the following issues. 1. History of a plasmacytoma of the right femur.  She is status post surgical fixation followed by external beam radiation.  2. 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  She has been off Revlimid since 03/2012. Her kappa free light chain has been increasing slowly over the last few months. Staging imaging studies did not show any evidence of bony disease. The plan is to continue with active surveillance and consider salvage therapy if she develops symptomatic myeloma. 3. Bony disease.  Continue to be  on calcium and vitamin D supplements.  We will considering starting bisphosphonates and her disease had progressed. 4. Right hip pain: MRI reviewed today and did not show any evidence of fractures or dislocation or malignancy. Her pain seems to be under reasonable control and able to be functional. We'll continue to monitor this moving forward and consider orthopedic  referral if needed to. 5. Follow up in 6 weeks.      Zola Button, MD 5/4/20163:25 PM

## 2014-10-24 LAB — KAPPA/LAMBDA LIGHT CHAINS
KAPPA FREE LGHT CHN: 87 mg/dL — AB (ref 0.33–1.94)
Kappa:Lambda Ratio: 98.86 — ABNORMAL HIGH (ref 0.26–1.65)
Lambda Free Lght Chn: 0.88 mg/dL (ref 0.57–2.63)

## 2014-11-05 DIAGNOSIS — C9030 Solitary plasmacytoma not having achieved remission: Secondary | ICD-10-CM | POA: Diagnosis not present

## 2014-11-05 DIAGNOSIS — M89251 Other disorders of bone development and growth, right femur: Secondary | ICD-10-CM | POA: Diagnosis not present

## 2014-11-05 DIAGNOSIS — M13851 Other specified arthritis, right hip: Secondary | ICD-10-CM | POA: Diagnosis not present

## 2014-11-05 DIAGNOSIS — M1990 Unspecified osteoarthritis, unspecified site: Secondary | ICD-10-CM | POA: Diagnosis not present

## 2014-11-12 ENCOUNTER — Other Ambulatory Visit: Payer: Self-pay | Admitting: *Deleted

## 2014-11-12 MED ORDER — SIMVASTATIN 20 MG PO TABS: 20.0000 mg | ORAL_TABLET | Freq: Every day | ORAL | Status: DC

## 2014-11-12 NOTE — Telephone Encounter (Addendum)
Hyperlipidemia last addressed 2013. Will fill two months - last seen July 2015. Will send to front desk to sch appt in June.

## 2014-11-13 ENCOUNTER — Other Ambulatory Visit: Payer: Self-pay | Admitting: *Deleted

## 2014-11-13 DIAGNOSIS — I10 Essential (primary) hypertension: Secondary | ICD-10-CM

## 2014-11-13 MED ORDER — AMLODIPINE BESYLATE 5 MG PO TABS: 5.0000 mg | ORAL_TABLET | Freq: Every day | ORAL | Status: DC

## 2014-12-06 ENCOUNTER — Ambulatory Visit (HOSPITAL_BASED_OUTPATIENT_CLINIC_OR_DEPARTMENT_OTHER): Payer: Medicare Other | Admitting: Oncology

## 2014-12-06 ENCOUNTER — Telehealth: Payer: Self-pay | Admitting: Oncology

## 2014-12-06 ENCOUNTER — Other Ambulatory Visit (HOSPITAL_BASED_OUTPATIENT_CLINIC_OR_DEPARTMENT_OTHER): Payer: Medicare Other

## 2014-12-06 VITALS — BP 146/66 | HR 66 | Temp 98.1°F | Resp 18 | Ht 65.0 in | Wt 121.3 lb

## 2014-12-06 DIAGNOSIS — C9000 Multiple myeloma not having achieved remission: Secondary | ICD-10-CM

## 2014-12-06 DIAGNOSIS — M25551 Pain in right hip: Secondary | ICD-10-CM

## 2014-12-06 LAB — COMPREHENSIVE METABOLIC PANEL (CC13)
ALBUMIN: 3.6 g/dL (ref 3.5–5.0)
ALK PHOS: 114 U/L (ref 40–150)
ALT: 15 U/L (ref 0–55)
ANION GAP: 8 meq/L (ref 3–11)
AST: 18 U/L (ref 5–34)
BILIRUBIN TOTAL: 0.83 mg/dL (ref 0.20–1.20)
BUN: 26 mg/dL (ref 7.0–26.0)
CALCIUM: 9.1 mg/dL (ref 8.4–10.4)
CO2: 25 mEq/L (ref 22–29)
Chloride: 108 mEq/L (ref 98–109)
Creatinine: 1.2 mg/dL — ABNORMAL HIGH (ref 0.6–1.1)
EGFR: 49 mL/min/{1.73_m2} — AB (ref >90)
Glucose: 95 mg/dl (ref 70–140)
POTASSIUM: 4.3 meq/L (ref 3.5–5.1)
SODIUM: 141 meq/L (ref 136–145)
Total Protein: 6.6 g/dL (ref 6.4–8.3)

## 2014-12-06 LAB — CBC WITH DIFFERENTIAL/PLATELET
BASO%: 0 % (ref 0.0–2.0)
BASOS ABS: 0 10*3/uL (ref 0.0–0.1)
EOS ABS: 0.1 10*3/uL (ref 0.0–0.5)
EOS%: 1.5 % (ref 0.0–7.0)
HCT: 31.8 % — ABNORMAL LOW (ref 34.8–46.6)
HEMOGLOBIN: 10.5 g/dL — AB (ref 11.6–15.9)
LYMPH#: 1.6 10*3/uL (ref 0.9–3.3)
LYMPH%: 30 % (ref 14.0–49.7)
MCH: 31.9 pg (ref 25.1–34.0)
MCHC: 33 g/dL (ref 31.5–36.0)
MCV: 96.7 fL (ref 79.5–101.0)
MONO#: 0.3 10*3/uL (ref 0.1–0.9)
MONO%: 5.7 % (ref 0.0–14.0)
NEUT%: 62.8 % (ref 38.4–76.8)
NEUTROS ABS: 3.3 10*3/uL (ref 1.5–6.5)
Platelets: 161 10*3/uL (ref 145–400)
RBC: 3.29 10*6/uL — ABNORMAL LOW (ref 3.70–5.45)
RDW: 12.1 % (ref 11.2–14.5)
WBC: 5.3 10*3/uL (ref 3.9–10.3)

## 2014-12-06 MED ORDER — OXYCODONE HCL 5 MG PO TABS: 5.0000 mg | ORAL_TABLET | ORAL | Status: DC | PRN

## 2014-12-06 NOTE — Progress Notes (Signed)
Hematology and Oncology Follow Up Visit  Kayla Brooks [id number] [birth year] 81 y.o. 12/06/2014 12:59 PM  CC: Kayla Ser, MD  Kayla Brooks. Kayla Blalock, MD, [facility]  Kayla Brooks, M.D., Ph.D.    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  She had a light chain multiple myeloma with M spike of about 0.35 gm/dL.  Prior Therapy: 1. Status post prophylactic fixation impending pathological fracture of the right femur done in August 2009. 2. External beam radiation concluded in 2010 at the right femur. 3. Revlimid 25 mg daily as maintenance therapy for 2 years ending 03/2012.  Current therapy:  Watchful observation.  Interim History:  [name] presents today for a followup visit. Since her last visit, she continues to do relatively well. She still have periodic hip pain which has not changed in intensity. Her pain is noted that while bearing weight mostly. She is able to sleep without any difficulty. Has not had any major changes in her pain quality with pain medication. Pain is not radiating and not associated with any neurological symptoms. Her ability to perform her activities of daily living is decreased secondary to the pain. She was evaluated by orthopedic surgery and no intervention is needed.  She had not had any nausea, not had any abdominal distension. Has not had any pathological fractures. She does not report any recent opportunistic infections. Her appetite is still good. She has not reported any headaches or blurry vision. Has not reported any chest pain or shortness of breath. Is not reporting any change in her bowel habits. She does not report any urinary symptoms of frequency urgency or hesitancy. Remainder of her review of systems unremarkable.   Medications: I have reviewed the patient's current medications.  Current Outpatient  Prescriptions  Medication Sig Dispense Refill  . amLODipine (NORVASC) 5 MG tablet Take 1 tablet (5 mg total) by mouth daily. 90 tablet 3  . aspirin 81 MG tablet Take 1 tablet (81 mg total) by mouth daily. 31 tablet 11  . Calcium Carbonate-Vitamin D (CALCIUM 600+D) 600-400 MG-UNIT per tablet Take 1 tablet by mouth daily. 30 tablet 3  . hydrocortisone cream 1 % Apply topically 2 (two) times daily. 30 g 0  . lisinopril (PRINIVIL,ZESTRIL) 40 MG tablet Take 1 tablet (40 mg total) by mouth daily. 90 tablet 3  . Loperamide-Simethicone (IMODIUM ADVANCED) 2-125 MG TABS Take 1 tablet 2 - 3 times per day as needed for diarrhea     . nitroGLYCERIN (NITROSTAT) 0.4 MG SL tablet Place 1 tablet (0.4 mg total) under the tongue every 5 (five) minutes as needed for chest pain. 90 tablet 5  . oxyCODONE (OXY IR/ROXICODONE) 5 MG immediate release tablet Take 1 tablet (5 mg total) by mouth every 4 (four) hours as needed for severe pain. 30 tablet 0  . simvastatin (ZOCOR) 20 MG tablet Take 1 tablet (20 mg total) by mouth at bedtime. 31 tablet 1   No current facility-administered medications for this visit.  , Allergies:  Allergies  Allergen Reactions  . Morphine And Related Other (See Comments)    Abdominal Pain    Past Medical History, Surgical history, Social history, and Family History were reviewed and updated.   Physical Exam: Blood pressure 146/66, pulse 66, temperature 98.1 F (36.7 C), temperature source Oral, resp. rate 18, height '5\' 5"'  (1.651 m), weight 121 lb 4.8  oz (55.021 kg), SpO2 100 %. ECOG: 1 General appearance: alert, awake NAD.  Head: Normocephalic, without obvious abnormality Neck: no adenopathy Lymph nodes: Cervical, supraclavicular, and axillary nodes normal. Heart:regular rate and rhythm, S1, S2 normal, no murmur, click, rub or gallop Lung:chest clear, no wheezing, rales, normal symmetric air entry Abdomen: soft, non-tender, without masses or organomegaly EXT:no evidence of edma.  Full range of motion noted in her hips. Skin: No rash or desquamation.   Lab Results: Lab Results  Component Value Date   WBC 5.3 12/06/2014   HGB 10.5* 12/06/2014   HCT 31.8* 12/06/2014   MCV 96.7 12/06/2014   PLT 161 12/06/2014   Results for Kayla Brooks, Kayla Brooks (MRN [id number]) as of 12/06/2014 12:50  Ref. Range 09/12/2014 14:33 10/17/2014 13:31 10/23/2014 15:00  Kappa free light chain Latest Ref Range: 0.33-1.94 mg/dL 83.00 (H) 95.40 (H) 87.00 (H)  Lambda Free Lght Chn Latest Ref Range: 0.57-2.63 mg/dL 0.29 (L) 0.90 0.88  Kappa:Lambda Ratio Latest Ref Range: 0.26-1.65  286.21 (H) 106.00 (H) 98.86 (H)    EXAM: METASTATIC BONE SURVEY -24 views  COMPARISON: 10/18/2013  FINDINGS: Images reveal cervical spondylosis ; aortic atherosclerotic calcifications; thoracic spondylosis; is a calcified gallstone ; lower lumbar spondylosis and degenerative disc disease; right hip intramedullary nail with helical femoral neck screw, and severe chondral thinning in the right hip. No myeloma lesions identified.  IMPRESSION: 1. No myeloma lesions identified. 2. Various ancillary findings on the 24 distinct images obtained today, as noted above.       Impression and Plan:  A 81 year old female with the following issues. 1. History of a plasmacytoma of the right femur.  She is status post surgical fixation followed by external beam radiation. She is experiencing increased pain in her right hip without any radiographic evidence of myeloma relapse. She follows up with orthopedic surgery regarding this.  2. 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  She has been off Revlimid since 03/2012. Her kappa free light chain has been relatively stable as of late and we have opted with continued observation. If she develops progression of disease, salvage therapy will be needed. Therapy will be the form of either a Revlimid based her Velcade based  regimens. At this time she is relatively stable and would prefer to continue with the current management. 3. Bony disease.  Continue to be on calcium and vitamin D supplements.  We will considering starting bisphosphonates and her disease had progressed. 4. Right hip pain: Seems to be controlled with oxycodone which I have refilled for her today.. 5. Follow up in 8 weeks sooner if needed to.      [facility], MD 6/17/201612:59 PM

## 2014-12-06 NOTE — Telephone Encounter (Signed)
Gave and printed appt sched and avs for pt for Aug °

## 2014-12-09 LAB — KAPPA/LAMBDA LIGHT CHAINS
KAPPA FREE LGHT CHN: 135 mg/dL — AB (ref 0.33–1.94)
Kappa:Lambda Ratio: 228.81 — ABNORMAL HIGH (ref 0.26–1.65)
LAMBDA FREE LGHT CHN: 0.59 mg/dL (ref 0.57–2.63)

## 2014-12-11 ENCOUNTER — Encounter: Payer: Self-pay | Admitting: Internal Medicine

## 2014-12-11 ENCOUNTER — Ambulatory Visit (INDEPENDENT_AMBULATORY_CARE_PROVIDER_SITE_OTHER): Payer: Medicare Other | Admitting: Internal Medicine

## 2014-12-11 VITALS — BP 141/71 | HR 84 | Temp 98.0°F | Ht 65.0 in | Wt 121.3 lb

## 2014-12-11 DIAGNOSIS — K5900 Constipation, unspecified: Secondary | ICD-10-CM | POA: Insufficient documentation

## 2014-12-11 DIAGNOSIS — R351 Nocturia: Secondary | ICD-10-CM

## 2014-12-11 DIAGNOSIS — K5901 Slow transit constipation: Secondary | ICD-10-CM

## 2014-12-11 DIAGNOSIS — N3941 Urge incontinence: Secondary | ICD-10-CM | POA: Insufficient documentation

## 2014-12-11 MED ORDER — DOCUSATE SODIUM 100 MG PO CAPS
100.0000 mg | ORAL_CAPSULE | Freq: Every day | ORAL | Status: AC | PRN
Start: 2014-12-11 — End: 2015-12-11

## 2014-12-11 MED ORDER — OXYBUTYNIN CHLORIDE 5 MG PO TABS: 5.0000 mg | ORAL_TABLET | Freq: Two times a day (BID) | ORAL | Status: DC

## 2014-12-11 NOTE — Progress Notes (Signed)
Subjective:   Patient ID: Kayla Brooks female   DOB: 09/13/1929 81 y.o.   MRN: [id number]  HPI: Kayla Brooks is a 81 y.o. female w/ PMHx of HTN and h/o MM and plasmacytoma of the right femur (recieved external beam radiation and Revlemid, ended on 2013), presents to the clinic today for a follow-up visit. Patient's main complaint today is increased nocturnal urinary frequency. She states this has been going on for quite some time, says she goes up 6-7 times per night. Also notes symptoms of urgency, says she has had a hard time making it to the bathroom on many occasions. She denies symptoms of stress incontinence; no loss of urine w/ coughing, sneezing, or laughing. Kayla Brooks does note she has issues w/ constipation, states she can go 3-4 days sometimes without having a bowel movement. She denies any symptoms of dysuria, hematuria, flank pain, or abdominal pain.    Current Outpatient Prescriptions  Medication Sig Dispense Refill  . amLODipine (NORVASC) 5 MG tablet Take 1 tablet (5 mg total) by mouth daily. 90 tablet 3  . aspirin 81 MG tablet Take 1 tablet (81 mg total) by mouth daily. 31 tablet 11  . Calcium Carbonate-Vitamin D (CALCIUM 600+D) 600-400 MG-UNIT per tablet Take 1 tablet by mouth daily. 30 tablet 3  . docusate sodium (COLACE) 100 MG capsule Take 1 capsule (100 mg total) by mouth daily as needed. 30 capsule 1  . hydrocortisone cream 1 % Apply topically 2 (two) times daily. 30 g 0  . lisinopril (PRINIVIL,ZESTRIL) 40 MG tablet Take 1 tablet (40 mg total) by mouth daily. 90 tablet 3  . Loperamide-Simethicone (IMODIUM ADVANCED) 2-125 MG TABS Take 1 tablet 2 - 3 times per day as needed for diarrhea     . nitroGLYCERIN (NITROSTAT) 0.4 MG SL tablet Place 1 tablet (0.4 mg total) under the tongue every 5 (five) minutes as needed for chest pain. 90 tablet 5  . oxybutynin (DITROPAN) 5 MG tablet Take 1 tablet (5 mg total) by mouth 2 (two) times daily. 60 tablet 2  . oxyCODONE (OXY  IR/ROXICODONE) 5 MG immediate release tablet Take 1 tablet (5 mg total) by mouth every 4 (four) hours as needed for severe pain. 30 tablet 0  . simvastatin (ZOCOR) 20 MG tablet Take 1 tablet (20 mg total) by mouth at bedtime. 31 tablet 1   No current facility-administered medications for this visit.    Review of Systems  General: Denies fever, diaphoresis, appetite change, and fatigue.  Respiratory: Denies SOB, cough, and wheezing.   Cardiovascular: Denies chest pain and palpitations.  Gastrointestinal: Denies nausea, vomiting, abdominal pain, and diarrhea GU: Positive for increased nocturnal urinary frequency.  Musculoskeletal: Denies myalgias, arthralgias, back pain, and gait problem.  Neurological: Denies dizziness, syncope, weakness, lightheadedness, and headaches.  Psychiatric/Behavioral: Denies mood changes, sleep disturbance, and agitation.   Objective:   Physical Exam: Filed Vitals:   12/11/14 1323  BP: 141/71  Pulse: 84  Temp: 98 F (36.7 C)  TempSrc: Oral  Height: 5\' 5"  (1.651 m)  Weight: 121 lb 4.8 oz (55.021 kg)  SpO2: 100%    General: Thin AA female, alert, cooperative, NAD. HEENT: PERRL, EOMI. Moist mucus membranes Neck: Full range of motion without pain, supple, no lymphadenopathy or carotid bruits Lungs: Clear to ascultation bilaterally, normal work of respiration, no wheezes, rales, rhonchi Heart: RRR, no murmurs, gallops, or rubs Abdomen: Soft, non-tender, non-distended, BS + Extremities: No cyanosis, clubbing, or edema Neurologic: Alert &  oriented x3, cranial nerves II-XII intact, strength grossly intact, sensation intact to light touch   Assessment & Plan:   Please see problem based assessment and plan.

## 2014-12-11 NOTE — Patient Instructions (Signed)
1. Schedule a follow up appointment for 2 weeks.   2. Please take all medications as previously prescribed with the following changes:  Continue taking your blood pressure medications as prescribed.   Start taking Colace 100 mg daily AS NEEDED for constipation. You should have ~1 bowel movement daily.   Start using Oxybutynin (ditropan) 5 mg twice daily. If your dry mouth is too much to handle, stop taking this medicine and we can consider other treatment options.   3. If you have worsening of your symptoms or new symptoms arise, please call the clinic ([phone]), or go to the ER immediately if symptoms are severe.

## 2014-12-12 LAB — URINALYSIS, ROUTINE W REFLEX MICROSCOPIC
Bilirubin Urine: NEGATIVE
Glucose, UA: NEGATIVE mg/dL
HGB URINE DIPSTICK: NEGATIVE
KETONES UR: NEGATIVE mg/dL
LEUKOCYTES UA: NEGATIVE
NITRITE: NEGATIVE
PROTEIN: NEGATIVE mg/dL
Specific Gravity, Urine: <1.005 — ABNORMAL LOW (ref 1.005–1.030)
Urobilinogen, UA: 0.2 mg/dL (ref 0.0–1.0)
pH: 5.5 (ref 5.0–8.0)

## 2014-12-12 NOTE — Assessment & Plan Note (Signed)
Patient w/ increased urinary frequency at night, says she goes 6-7 times. Also notes symptoms of urgency, has lost her urine d/t not making it to the bathroom on time. No symptoms of stress incontinence. Feel that patient has urge incontinence most significant at night, also related to constipation. UA not significant for infection. Recent renal function relatively normal. No obvious medications contributing to overactive bladder.  -Discussed behavioral modification to decrease overactive bladder symptoms; voiding prior to bed time, decreasing fluid intake prior to sleep, time voiding throughout the day, Kegel exercises. -Will give Colace prn for constipation -Start Oxybutynin 5 mg bid for now; discussed adverse effects such as dry mouth, constipation, understands that these are possible. She will stop taking medication if adverse effects are too overwhelming.

## 2014-12-12 NOTE — Assessment & Plan Note (Signed)
Ongoing issue for her. States she can go 3-4 days without having a bowel movement.  -Colace prn -Advised OTC fiber supplementation

## 2014-12-12 NOTE — Addendum Note (Signed)
Addended byCorky Sox on: 12/12/2014 02:53 PM   Modules accepted: Level of Service

## 2014-12-13 NOTE — Progress Notes (Signed)
INTERNAL MEDICINE TEACHING ATTENDING ADDENDUM - Daysia Vandenboom, MD: I reviewed and discussed at the time of visit with the resident Dr. Jones, the patient's medical history, physical examination, diagnosis and results of pertinent tests and treatment and I agree with the patient's care as documented.  

## 2014-12-30 ENCOUNTER — Encounter: Payer: Medicare Other | Admitting: Internal Medicine

## 2015-01-02 ENCOUNTER — Encounter: Payer: Self-pay | Admitting: Internal Medicine

## 2015-01-02 ENCOUNTER — Ambulatory Visit (INDEPENDENT_AMBULATORY_CARE_PROVIDER_SITE_OTHER): Payer: Medicare Other | Admitting: Internal Medicine

## 2015-01-02 VITALS — BP 147/57 | HR 57 | Temp 98.6°F | Ht 65.0 in | Wt 119.0 lb

## 2015-01-02 DIAGNOSIS — K219 Gastro-esophageal reflux disease without esophagitis: Secondary | ICD-10-CM

## 2015-01-02 DIAGNOSIS — N3941 Urge incontinence: Secondary | ICD-10-CM | POA: Diagnosis present

## 2015-01-02 DIAGNOSIS — K5900 Constipation, unspecified: Secondary | ICD-10-CM

## 2015-01-02 DIAGNOSIS — Z0000 Encounter for general adult medical examination without abnormal findings: Secondary | ICD-10-CM

## 2015-01-02 DIAGNOSIS — K5901 Slow transit constipation: Secondary | ICD-10-CM

## 2015-01-02 DIAGNOSIS — I10 Essential (primary) hypertension: Secondary | ICD-10-CM

## 2015-01-02 MED ORDER — OXYBUTYNIN CHLORIDE 5 MG PO TABS: 5.0000 mg | ORAL_TABLET | Freq: Two times a day (BID) | ORAL | Status: DC

## 2015-01-02 MED ORDER — OMEPRAZOLE 20 MG PO CPDR: 20.0000 mg | DELAYED_RELEASE_CAPSULE | Freq: Every day | ORAL | Status: DC

## 2015-01-02 NOTE — Assessment & Plan Note (Addendum)
-  Pt not candidate for shingles vaccination in setting of immunocompromised state -Inquire about prevnar vaccination at next visit -Consider screening HIV Ab and 25-OH vitamin D level at next visit

## 2015-01-02 NOTE — Patient Instructions (Signed)
-Start taking prilosec 20 mg daily for acid reflux and read the information below -30-day supply of ditropan should be $10 at [facility], please let us know if you are not able to get this  -Please come back if your symptoms do not improve, very nice meeting you!  Gastroesophageal Reflux Disease, Adult Gastroesophageal reflux disease (GERD) happens when acid from your stomach goes into your food pipe (esophagus). The acid can cause a burning feeling in your chest. Over time, the acid can make small holes (ulcers) in your food pipe.  HOME CARE  Ask your doctor for advice about:  Losing weight.  Quitting smoking.  Alcohol use.  Avoid foods and drinks that make your problems worse. You may want to avoid:  Caffeine and alcohol.  Chocolate.  Mints.  Garlic and onions.  Spicy foods.  Citrus fruits, such as oranges, lemons, or limes.  Foods that contain tomato, such as sauce, chili, salsa, and pizza.  Fried and fatty foods.  Avoid lying down for 3 hours before you go to bed or before you take a nap.  Eat small meals often, instead of large meals.  Wear loose-fitting clothing. Do not wear anything tight around your waist.  Raise (elevate) the head of your bed 6 to 8 inches with wood blocks. Using extra pillows does not help.  Only take medicines as told by your doctor.  Do not take aspirin or ibuprofen. GET HELP RIGHT AWAY IF:   You have pain in your arms, neck, jaw, teeth, or back.  Your pain gets worse or changes.  You feel sick to your stomach (nauseous), throw up (vomit), or sweat (diaphoresis).  You feel short of breath, or you pass out (faint).  Your throw up is green, yellow, black, or looks like coffee grounds or blood.  Your poop (stool) is red, bloody, or black. MAKE SURE YOU:   Understand these instructions.  Will watch your condition.  Will get help right away if you are not doing well or get worse. Document Released: 11/24/2007 Document Revised:  08/30/2011 Document Reviewed: 12/25/2010 [facility] Patient Information 2015 [place], [place]. This information is not intended to replace advice given to you by your health care provider. Make sure you discuss any questions you have with your health care provider.  Food Choices for Gastroesophageal Reflux Disease When you have gastroesophageal reflux disease (GERD), the foods you eat and your eating habits are very important. Choosing the right foods can help ease your discomfort.  WHAT GUIDELINES DO I NEED TO FOLLOW?   Choose fruits, vegetables, whole grains, and low-fat dairy products.   Choose low-fat meat, fish, and poultry.  Limit fats such as oils, salad dressings, butter, nuts, and avocado.   Keep a food diary. This helps you identify foods that cause symptoms.   Avoid foods that cause symptoms. These may be different for everyone.   Eat small meals often instead of 3 large meals a day.   Eat your meals slowly, in a place where you are relaxed.   Limit fried foods.   Cook foods using methods other than frying.   Avoid drinking alcohol.   Avoid drinking large amounts of liquids with your meals.   Avoid bending over or lying down until 2-3 hours after eating.  WHAT FOODS ARE NOT RECOMMENDED?  These are some foods and drinks that may make your symptoms worse: Vegetables Tomatoes. Tomato juice. Tomato and spaghetti sauce. Chili peppers. Onion and garlic. Horseradish. Fruits Oranges, grapefruit, and lemon (fruit  and juice). Meats High-fat meats, fish, and poultry. This includes hot dogs, ribs, ham, sausage, salami, and bacon. Dairy Whole milk and chocolate milk. Sour cream. Cream. Butter. Ice cream. Cream cheese.  Drinks Coffee and tea. Bubbly (carbonated) drinks or energy drinks. Condiments Hot sauce. Barbecue sauce.  Sweets/Desserts Chocolate and cocoa. Donuts. Peppermint and spearmint. Fats and Oils High-fat foods. This includes [place] fries and potato  chips. Other Vinegar. Strong spices. This includes black pepper, white pepper, red pepper, cayenne, curry powder, cloves, ginger, and chili powder. The items listed above may not be a complete list of foods and drinks to avoid. Contact your dietitian for more information. Document Released: 12/07/2011 Document Revised: 06/12/2013 Document Reviewed: 04/11/2013 [facility] Patient Information 2015 [place], [place]. This information is not intended to replace advice given to you by your health care provider. Make sure you discuss any questions you have with your health care provider.   General Instructions:   Thank you for bringing your medicines today. This helps [place] keep you safe from mistakes.   Progress Toward Treatment Goals:  Treatment Goal 01/01/2014  Blood pressure at goal  Prevent falls -    Self Care Goals & Plans:  Self Care Goal 01/02/2015  Manage my medications take my medicines as prescribed; bring my medications to every visit; refill my medications on time; follow the sick day instructions if I am sick  Eat healthy foods eat more vegetables; eat fruit for snacks and desserts; eat baked foods instead of fried foods; eat foods that are low in salt; eat smaller portions  Be physically active find an activity I enjoy  Meeting treatment goals -    No flowsheet data found.   Care Management & Community Referrals:  Referral 11/10/2012  Referrals made for care management support none needed

## 2015-01-02 NOTE — Assessment & Plan Note (Signed)
Assessment: Pt with history of GERD not on medical therapy who presents with persistent symptoms without alarm symptoms.  Plan:  -Prescribe omeprazole 20 mg daily  -Pt given handout regarding behavioral modification  -Monitor for alarm symptoms warranting EGD

## 2015-01-02 NOTE — Progress Notes (Signed)
Patient ID: Kayla Brooks, female   DOB: 07/01/1929, 81 y.o.   MRN: [id number]    Subjective:   Patient ID: Kayla Brooks female   DOB: 08/03/1929 81 y.o.   MRN: [id number]  HPI:Kayla Brooks is a 81 y.o. very pleasant woman with past medical history of hypertension, hyperlipidemia, Stage 1 B multiple myeloma (2010) of right femur s/p radiation, revlimid, prophylactic fixation of right femur (2009), and urge incontinence who presents for follow-up visit of urge incontinence and constipation.  She was seen on 6/23 for urinary frequency thought to be due to urge incontinence and started on ditropan which she was unfortunately unable to afford. She continues to have urinary frequency and urgency without burning or leakage. UA at last visit was normal. She denies polydipsia, polyphagia, or history of diabetes. She was also started on colace as needed for constipation at last visit which she reports has somewhat improved. She was prescribed oxycodone on 12/06/14 for pain.  She has history of GERD not currently on medication but used to be on omeprazole. She reports occasional sensation of something stuck in her throat when she eats food, regurgitation, and possible heart burn without odynophagia, melena, or weight loss.    She is compliant with taking amlodipine and lisinopril for hypertension. She has chronic blurry vision but denies chest pain, headache, LE edema, or lightheadedness.    Past Medical History  Diagnosis Date  . Multiple myeloma without mention of remission   . Monoclonal paraproteinemia   . Hypertension    Current Outpatient Prescriptions  Medication Sig Dispense Refill  . amLODipine (NORVASC) 5 MG tablet Take 1 tablet (5 mg total) by mouth daily. 90 tablet 3  . aspirin 81 MG tablet Take 1 tablet (81 mg total) by mouth daily. 31 tablet 11  . Calcium Carbonate-Vitamin D (CALCIUM 600+D) 600-400 MG-UNIT per tablet Take 1 tablet by mouth daily. 30 tablet 3  . docusate sodium  (COLACE) 100 MG capsule Take 1 capsule (100 mg total) by mouth daily as needed. 30 capsule 1  . hydrocortisone cream 1 % Apply topically 2 (two) times daily. 30 g 0  . lisinopril (PRINIVIL,ZESTRIL) 40 MG tablet Take 1 tablet (40 mg total) by mouth daily. 90 tablet 3  . Loperamide-Simethicone (IMODIUM ADVANCED) 2-125 MG TABS Take 1 tablet 2 - 3 times per day as needed for diarrhea     . nitroGLYCERIN (NITROSTAT) 0.4 MG SL tablet Place 1 tablet (0.4 mg total) under the tongue every 5 (five) minutes as needed for chest pain. 90 tablet 5  . oxybutynin (DITROPAN) 5 MG tablet Take 1 tablet (5 mg total) by mouth 2 (two) times daily. 60 tablet 2  . oxyCODONE (OXY IR/ROXICODONE) 5 MG immediate release tablet Take 1 tablet (5 mg total) by mouth every 4 (four) hours as needed for severe pain. 30 tablet 0  . simvastatin (ZOCOR) 20 MG tablet Take 1 tablet (20 mg total) by mouth at bedtime. 31 tablet 1   No current facility-administered medications for this visit.   No family history on file. History   Social History  . Marital Status: Widowed    Spouse Name: N/A  . Number of Children: N/A  . Years of Education: N/A   Social History Main Topics  . Smoking status: Former Smoker  . Smokeless tobacco: Not on file  . Alcohol Use: No  . Drug Use: No  . Sexual Activity: Not on file   Other Topics Concern  . Not on   file   Social History Narrative   Review of Systems: Review of Systems  Constitutional: Negative for fever and chills.  Eyes: Positive for blurred vision (chronic ).  Respiratory: Negative for cough, shortness of breath and wheezing.   Cardiovascular: Negative for chest pain and leg swelling.  Gastrointestinal: Positive for heartburn. Negative for nausea, vomiting, diarrhea, constipation, blood in stool and melena.  Genitourinary: Positive for urgency and frequency. Negative for dysuria and hematuria.  Musculoskeletal: Positive for joint pain (right hip). Negative for myalgias and  falls.  Neurological: Negative for dizziness and headaches.  Endo/Heme/Allergies: Negative for polydipsia.    Objective:  Physical Exam: Filed Vitals:   01/02/15 1558  BP: 147/57  Pulse: 57  Temp: 98.6 F (37 C)  TempSrc: Oral  Height: 5' 5" (1.651 m)  Weight: 119 lb (53.978 kg)  SpO2: 100%    Physical Exam  Constitutional: She is oriented to person, place, and time. She appears well-developed and well-nourished. No distress.  HENT:  Head: Normocephalic and atraumatic.  Right Ear: External ear normal.  Left Ear: External ear normal.  Nose: Nose normal.  Mouth/Throat: Oropharynx is clear and moist. No oropharyngeal exudate.  Eyes: Conjunctivae and EOM are normal. Pupils are equal, round, and reactive to light. Right eye exhibits no discharge. Left eye exhibits no discharge. No scleral icterus.  Neck: Normal range of motion. Neck supple.  Cardiovascular: Normal rate, regular rhythm and normal heart sounds.   No murmur heard. Pulmonary/Chest: Effort normal and breath sounds normal. No respiratory distress. She has no wheezes. She has no rales.  Abdominal: Soft. Bowel sounds are normal. She exhibits no distension. There is no tenderness. There is no rebound and no guarding.  Musculoskeletal: Normal range of motion. She exhibits no edema or tenderness.  Neurological: She is alert and oriented to person, place, and time.  Skin: Skin is warm and dry. No rash noted. She is not diaphoretic. No erythema. No pallor.  Psychiatric: She has a normal mood and affect. Her behavior is normal. Judgment and thought content normal.    Assessment & Plan:   Please see problem list for problem-based assessment and plan 

## 2015-01-02 NOTE — Assessment & Plan Note (Signed)
Assessment: Pt with constipation most likely due to recent narcotic usage who presents with improved symptoms on stool softener therapy.  Plan: -Continue colace 100 mg daily PRN -Consider changing to Senokot-S if symptoms do not improve

## 2015-01-02 NOTE — Assessment & Plan Note (Signed)
Assessment: Pt with well-controlled hypertension compliant with two-class (ACEi & CCB) anti-hypertensive therapy who presents with blood pressure of 147/57.  Plan:  -BP 147/57 at goal <150/90  -Continue amlodipine 5 mg daily and lisinopril 40 mg daily  -Last CMP on 12/06/14 was normal

## 2015-01-02 NOTE — Assessment & Plan Note (Addendum)
Assessment: Pt with urge incontinence unable to afford anti-cholinergic therapy who presents with persistent symptoms.    Plan:  -Re-prescribe oxybutynin 5 mg BID, monitor mental status in setting of advanced age. With medication savings program should be $9.99 for 30-day supply at [facility].  -Continue kegel exercises and behavorial modification  -Consider bladder scan at next visit to assess for urinary retention

## 2015-01-03 NOTE — Progress Notes (Signed)
Internal Medicine Clinic Attending  Case discussed with Dr. Rabbani soon after the resident saw the patient.  We reviewed the resident's history and exam and pertinent patient test results.  I agree with the assessment, diagnosis, and plan of care documented in the resident's note.  

## 2015-01-31 ENCOUNTER — Telehealth: Payer: Self-pay | Admitting: Oncology

## 2015-01-31 ENCOUNTER — Ambulatory Visit (HOSPITAL_BASED_OUTPATIENT_CLINIC_OR_DEPARTMENT_OTHER): Payer: Medicare Other | Admitting: Oncology

## 2015-01-31 ENCOUNTER — Other Ambulatory Visit (HOSPITAL_BASED_OUTPATIENT_CLINIC_OR_DEPARTMENT_OTHER): Payer: Medicare Other

## 2015-01-31 VITALS — BP 167/65 | HR 61 | Temp 97.6°F | Resp 18 | Ht 65.0 in | Wt 117.0 lb

## 2015-01-31 DIAGNOSIS — C9000 Multiple myeloma not having achieved remission: Secondary | ICD-10-CM

## 2015-01-31 DIAGNOSIS — G8929 Other chronic pain: Secondary | ICD-10-CM | POA: Diagnosis not present

## 2015-01-31 DIAGNOSIS — M25551 Pain in right hip: Secondary | ICD-10-CM | POA: Diagnosis not present

## 2015-01-31 LAB — COMPREHENSIVE METABOLIC PANEL (CC13)
ALK PHOS: 121 U/L (ref 40–150)
ALT: 17 U/L (ref 0–55)
AST: 25 U/L (ref 5–34)
Albumin: 3.2 g/dL — ABNORMAL LOW (ref 3.5–5.0)
Anion Gap: 8 mEq/L (ref 3–11)
BILIRUBIN TOTAL: 0.48 mg/dL (ref 0.20–1.20)
BUN: 26.3 mg/dL — AB (ref 7.0–26.0)
CO2: 21 mEq/L — ABNORMAL LOW (ref 22–29)
Calcium: 9 mg/dL (ref 8.4–10.4)
Chloride: 110 mEq/L — ABNORMAL HIGH (ref 98–109)
Creatinine: 1.1 mg/dL (ref 0.6–1.1)
EGFR: 53 mL/min/{1.73_m2} — AB (ref >90)
GLUCOSE: 94 mg/dL (ref 70–140)
Potassium: 4.4 mEq/L (ref 3.5–5.1)
Sodium: 139 mEq/L (ref 136–145)
Total Protein: 7 g/dL (ref 6.4–8.3)

## 2015-01-31 LAB — CBC WITH DIFFERENTIAL/PLATELET
BASO%: 0.5 % (ref 0.0–2.0)
BASOS ABS: 0 10*3/uL (ref 0.0–0.1)
EOS%: 1.3 % (ref 0.0–7.0)
Eosinophils Absolute: 0.1 10*3/uL (ref 0.0–0.5)
HCT: 31.3 % — ABNORMAL LOW (ref 34.8–46.6)
HGB: 10.3 g/dL — ABNORMAL LOW (ref 11.6–15.9)
LYMPH%: 23.2 % (ref 14.0–49.7)
MCH: 31.5 pg (ref 25.1–34.0)
MCHC: 33 g/dL (ref 31.5–36.0)
MCV: 95.7 fL (ref 79.5–101.0)
MONO#: 0.4 10*3/uL (ref 0.1–0.9)
MONO%: 6.1 % (ref 0.0–14.0)
NEUT%: 68.9 % (ref 38.4–76.8)
NEUTROS ABS: 4 10*3/uL (ref 1.5–6.5)
PLATELETS: 210 10*3/uL (ref 145–400)
RBC: 3.27 10*6/uL — AB (ref 3.70–5.45)
RDW: 12.1 % (ref 11.2–14.5)
WBC: 5.9 10*3/uL (ref 3.9–10.3)
lymph#: 1.4 10*3/uL (ref 0.9–3.3)

## 2015-01-31 NOTE — Telephone Encounter (Signed)
per pof to sch pt appt-gave pt copy of avs-reminded pt to go to xray on 10/12 after lab

## 2015-01-31 NOTE — Progress Notes (Signed)
Hematology and Oncology Follow Up Visit  Kayla Brooks [id number] 1929/09/21 81 y.o. 01/31/2015 4:03 PM  CC: [name], MD  [name], MD, [facility]  [name], M.D., Ph.D.    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  She had a light chain multiple myeloma with M spike of about 0.35 gm/dL.  Prior Therapy: 1. Status post prophylactic fixation impending pathological fracture of the right femur done in August 2009. 2. External beam radiation concluded in 2010 at the right femur. 3. Revlimid 25 mg daily as maintenance therapy for 2 years ending 03/2012.  Current therapy:  Watchful observation.  Interim History:  [name] presents today for a followup visit. Since her last visit, she reports no new complaintsl. She continues to report periodic hip pain which has not changed in intensity. Her pain is noted that while bearing weight mostly and ambulating. Despite that, she continues to be active and ambulating without major difficulty. She is able to sleep without any difficulty. Has not had any major changes in her pain quality with pain medication. Pain is not radiating and not associated with any neurological symptoms. She continues to deny any pathological fractures or falls. She had not reported any opportunistic infections or hospitalizations.  She does not report any headaches, blurry vision or syncope. She does not report any fevers or chills or sweats. She had not had any nausea, not had any abdominal distension. Has not reported any chest pain or shortness of breath. Is not reporting any change in her bowel habits. She does not report any urinary symptoms of frequency urgency or hesitancy. Remainder of her review of systems unremarkable.   Medications: I have reviewed the patient's current medications.  Current Outpatient  Prescriptions  Medication Sig Dispense Refill  . amLODipine (NORVASC) 5 MG tablet Take 1 tablet (5 mg total) by mouth daily. 90 tablet 3  . aspirin 81 MG tablet Take 1 tablet (81 mg total) by mouth daily. 31 tablet 11  . Calcium Carbonate-Vitamin D (CALCIUM 600+D) 600-400 MG-UNIT per tablet Take 1 tablet by mouth daily. 30 tablet 3  . docusate sodium (COLACE) 100 MG capsule Take 1 capsule (100 mg total) by mouth daily as needed. 30 capsule 1  . lisinopril (PRINIVIL,ZESTRIL) 40 MG tablet Take 1 tablet (40 mg total) by mouth daily. 90 tablet 3  . nitroGLYCERIN (NITROSTAT) 0.4 MG SL tablet Place 1 tablet (0.4 mg total) under the tongue every 5 (five) minutes as needed for chest pain. 90 tablet 5  . omeprazole (PRILOSEC) 20 MG capsule Take 1 capsule (20 mg total) by mouth daily. 30 capsule 2  . oxybutynin (DITROPAN) 5 MG tablet Take 1 tablet (5 mg total) by mouth 2 (two) times daily. 60 tablet 1  . oxyCODONE (OXY IR/ROXICODONE) 5 MG immediate release tablet Take 1 tablet (5 mg total) by mouth every 4 (four) hours as needed for severe pain. 30 tablet 0  . simvastatin (ZOCOR) 20 MG tablet Take 1 tablet (20 mg total) by mouth at bedtime. 31 tablet 1   No current facility-administered medications for this visit.  , Allergies:  Allergies  Allergen Reactions  . Morphine And Related Other (See Comments)    Abdominal Pain    Past Medical History, Surgical history, Social history, and Family History were reviewed and updated.   Physical Exam: Blood pressure 167/65,  pulse 61, temperature 97.6 F (36.4 C), temperature source Oral, resp. rate 18, height _0  (1.651 m), weight 117 lb (53.071 kg), SpO2 100 %. ECOG: 1 General appearance: alert, awake woman not in any distress. Head: Normocephalic, without obvious abnormality Neck: no adenopathy Lymph nodes: Cervical, supraclavicular, and axillary nodes normal. Heart:regular rate and rhythm, S1, S2 normal, no murmur, click, rub or gallop Lung:chest  clear, no wheezing, rales, normal symmetric air entry Abdomen: soft, non-tender, without masses or organomegaly EXT:no evidence of edma. Full range of motion noted in her hips. Skin: No rash or desquamation.   Lab Results: Lab Results  Component Value Date   WBC 5.9 01/31/2015   HGB 10.3* 01/31/2015   HCT 31.3* 01/31/2015   MCV 95.7 01/31/2015   PLT 210 01/31/2015      Impression and Plan:  A 81 year old female with the following issues. 1. History of a plasmacytoma of the right femur.  She is status post surgical fixation followed by external beam radiation. She is experiencing increased pain in her right hip which is chronic in nature. She had imaging studies in April 2016 and showed no major changes. I plan on repeating skeletal survey in October 2016. 2. 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  She has been off Revlimid since 03/2012. Her kappa free light chain has been relatively stable as of late and we have opted with continued observation. I plan on restarting salvage therapy upon symptomatic progression. 3. Bony disease.  Continue to be on calcium and vitamin D supplements.  We will considering starting bisphosphonates and her disease had progressed. 4. Right hip pain: Seems to be controlled with oxycodone which she uses very rarely at this time. I gave her clear instructions how to use it more refill to the future if needed to. 5. Follow up in 8 weeks sooner if needed to.      [facility], MD 8/12/20164:03 PM

## 2015-02-03 LAB — KAPPA/LAMBDA LIGHT CHAINS
KAPPA FREE LGHT CHN: 171 mg/dL — AB (ref 0.33–1.94)
Kappa:Lambda Ratio: 438.46 — ABNORMAL HIGH (ref 0.26–1.65)
Lambda Free Lght Chn: 0.39 mg/dL — ABNORMAL LOW (ref 0.57–2.63)

## 2015-04-02 ENCOUNTER — Ambulatory Visit (HOSPITAL_COMMUNITY): Payer: Medicare Other

## 2015-04-02 ENCOUNTER — Other Ambulatory Visit: Payer: Medicare Other

## 2015-04-02 ENCOUNTER — Telehealth: Payer: Self-pay | Admitting: Oncology

## 2015-04-02 NOTE — Telephone Encounter (Signed)
Patient called today to r/s 10/12 lab/xray to 10/14. Patient given new lab appointment for 10/14 @ 1:15 pm and will do xray after. Other appointments remain the same. Spoke with Otila Kluver in radiology this morning re patient not coming for xray today.

## 2015-04-04 ENCOUNTER — Other Ambulatory Visit (HOSPITAL_BASED_OUTPATIENT_CLINIC_OR_DEPARTMENT_OTHER): Payer: Medicare Other

## 2015-04-04 ENCOUNTER — Ambulatory Visit (HOSPITAL_COMMUNITY): Payer: Medicare Other | Attending: Oncology

## 2015-04-04 DIAGNOSIS — C9000 Multiple myeloma not having achieved remission: Secondary | ICD-10-CM

## 2015-04-04 LAB — COMPREHENSIVE METABOLIC PANEL (CC13)
ALT: 14 U/L (ref 0–55)
AST: 21 U/L (ref 5–34)
Albumin: 3.7 g/dL (ref 3.5–5.0)
Alkaline Phosphatase: 124 U/L (ref 40–150)
Anion Gap: 9 mEq/L (ref 3–11)
BILIRUBIN TOTAL: 0.59 mg/dL (ref 0.20–1.20)
BUN: 29.1 mg/dL — AB (ref 7.0–26.0)
CO2: 22 meq/L (ref 22–29)
Calcium: 9.3 mg/dL (ref 8.4–10.4)
Chloride: 107 mEq/L (ref 98–109)
Creatinine: 1.2 mg/dL — ABNORMAL HIGH (ref 0.6–1.1)
EGFR: 47 mL/min/{1.73_m2} — AB (ref >90)
GLUCOSE: 95 mg/dL (ref 70–140)
Potassium: 5.1 mEq/L (ref 3.5–5.1)
Sodium: 139 mEq/L (ref 136–145)
TOTAL PROTEIN: 7.3 g/dL (ref 6.4–8.3)

## 2015-04-04 LAB — CBC WITH DIFFERENTIAL/PLATELET
BASO%: 0.5 % (ref 0.0–2.0)
Basophils Absolute: 0 10*3/uL (ref 0.0–0.1)
EOS%: 2.1 % (ref 0.0–7.0)
Eosinophils Absolute: 0.1 10*3/uL (ref 0.0–0.5)
HEMATOCRIT: 33.7 % — AB (ref 34.8–46.6)
HEMOGLOBIN: 11 g/dL — AB (ref 11.6–15.9)
LYMPH#: 1.1 10*3/uL (ref 0.9–3.3)
LYMPH%: 23.7 % (ref 14.0–49.7)
MCH: 31.5 pg (ref 25.1–34.0)
MCHC: 32.7 g/dL (ref 31.5–36.0)
MCV: 96.2 fL (ref 79.5–101.0)
MONO#: 0.3 10*3/uL (ref 0.1–0.9)
MONO%: 5.9 % (ref 0.0–14.0)
NEUT#: 3.2 10*3/uL (ref 1.5–6.5)
NEUT%: 67.8 % (ref 38.4–76.8)
PLATELETS: 170 10*3/uL (ref 145–400)
RBC: 3.5 10*6/uL — ABNORMAL LOW (ref 3.70–5.45)
RDW: 13.2 % (ref 11.2–14.5)
WBC: 4.7 10*3/uL (ref 3.9–10.3)

## 2015-04-08 LAB — SPEP & IFE WITH QIG
ALBUMIN ELP: 3.7 g/dL — AB (ref 3.8–4.8)
Abnormal Protein Band1: 1.4 g/dL
Alpha-1-Globulin: 0.3 g/dL (ref 0.2–0.3)
Alpha-2-Globulin: 0.7 g/dL (ref 0.5–0.9)
BETA 2: 0.3 g/dL (ref 0.2–0.5)
BETA GLOBULIN: 0.4 g/dL (ref 0.4–0.6)
Gamma Globulin: 1.8 g/dL — ABNORMAL HIGH (ref 0.8–1.7)
IGA: 38 mg/dL — AB (ref 69–380)
IgG (Immunoglobin G), Serum: 585 mg/dL — ABNORMAL LOW (ref 690–1700)
TOTAL PROTEIN, SERUM ELECTROPHOR: 7.1 g/dL (ref 6.1–8.1)

## 2015-04-08 LAB — KAPPA/LAMBDA LIGHT CHAINS
Kappa free light chain: 258 mg/dL — ABNORMAL HIGH (ref 0.33–1.94)
Lambda Free Lght Chn: 0.25 mg/dL — ABNORMAL LOW (ref 0.57–2.63)

## 2015-04-09 ENCOUNTER — Other Ambulatory Visit: Payer: Self-pay | Admitting: Internal Medicine

## 2015-04-10 ENCOUNTER — Ambulatory Visit (HOSPITAL_BASED_OUTPATIENT_CLINIC_OR_DEPARTMENT_OTHER): Payer: Medicare Other | Admitting: Oncology

## 2015-04-10 ENCOUNTER — Telehealth: Payer: Self-pay | Admitting: Oncology

## 2015-04-10 VITALS — BP 110/80 | HR 56 | Temp 98.8°F | Resp 18 | Ht 65.0 in | Wt 112.6 lb

## 2015-04-10 DIAGNOSIS — C9000 Multiple myeloma not having achieved remission: Secondary | ICD-10-CM

## 2015-04-10 DIAGNOSIS — M25551 Pain in right hip: Secondary | ICD-10-CM | POA: Diagnosis not present

## 2015-04-10 DIAGNOSIS — G8929 Other chronic pain: Secondary | ICD-10-CM | POA: Diagnosis not present

## 2015-04-10 DIAGNOSIS — C9002 Multiple myeloma in relapse: Secondary | ICD-10-CM

## 2015-04-10 MED ORDER — OXYCODONE HCL 5 MG PO TABS: 5.0000 mg | ORAL_TABLET | ORAL | Status: DC | PRN

## 2015-04-10 NOTE — Addendum Note (Signed)
Addended by: Randolm Idol on: 04/10/2015 03:58 PM   Modules accepted: Medications

## 2015-04-10 NOTE — Telephone Encounter (Signed)
Gave and printed appts ched and avs for pt for DEC  °

## 2015-04-10 NOTE — Progress Notes (Signed)
Hematology and Oncology Follow Up Visit  SRESHTA CRESSLER [id number] [birth year] 81 y.o. 04/10/2015 3:41 PM  CC: [name], MD  [name], MD, [facility]  [name], M.D., Ph.D.    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  She had a light chain multiple myeloma with M spike of about 0.35 gm/dL.  Prior Therapy: 1. Status post prophylactic fixation impending pathological fracture of the right femur done in August 2009. 2. External beam radiation concluded in 2010 at the right femur. 3. Revlimid 25 mg daily as maintenance therapy for 2 years ending 03/2012.  Current therapy:  Watchful observation under consideration for salvage therapy.  Interim History:  [name] presents today for a followup visit. Since her last visit, she reports doing better. She continues to report periodic hip pain which has has improved slightly. She reports that she is able to ambulate for the most part without using a cane. She is able to use public transportation without any difficulty. Her pain is noted that while bearing weight mostly and ambulating but has been less frequent. Pain is not radiating and not associated with any neurological symptoms. She continues to deny any pathological fractures or falls.   She is able to sleep without any difficulty. Has not had any major changes in her pain quality with pain medication. She had not reported any opportunistic infections or hospitalizations.  She does not report any headaches, blurry vision or syncope. She does not report any fevers or chills or sweats. She had not had any nausea, not had any abdominal distension. Has not reported any chest pain or shortness of breath. Is not reporting any change in her bowel habits. She does not report any urinary symptoms of frequency urgency or hesitancy. Remainder of her  review of systems unremarkable.   Medications: I have reviewed the patient's current medications.  Current Outpatient Prescriptions  Medication Sig Dispense Refill  . amLODipine (NORVASC) 5 MG tablet Take 1 tablet (5 mg total) by mouth daily. 90 tablet 3  . aspirin 81 MG tablet Take 1 tablet (81 mg total) by mouth daily. 31 tablet 11  . Calcium Carbonate-Vitamin D (CALCIUM 600+D) 600-400 MG-UNIT per tablet Take 1 tablet by mouth daily. 30 tablet 3  . docusate sodium (COLACE) 100 MG capsule Take 1 capsule (100 mg total) by mouth daily as needed. 30 capsule 1  . lisinopril (PRINIVIL,ZESTRIL) 40 MG tablet Take 1 tablet (40 mg total) by mouth daily. 90 tablet 3  . nitroGLYCERIN (NITROSTAT) 0.4 MG SL tablet Place 1 tablet (0.4 mg total) under the tongue every 5 (five) minutes as needed for chest pain. 90 tablet 5  . omeprazole (PRILOSEC) 20 MG capsule Take 1 capsule (20 mg total) by mouth daily. 30 capsule 2  . oxybutynin (DITROPAN) 5 MG tablet Take 1 tablet (5 mg total) by mouth 2 (two) times daily. 60 tablet 1  . oxyCODONE (OXY IR/ROXICODONE) 5 MG immediate release tablet Take 1 tablet (5 mg total) by mouth every 4 (four) hours as needed for severe pain. 30 tablet 0  . simvastatin (ZOCOR) 20 MG tablet take 1 tablet by mouth at bedtime 30 tablet 5   No current facility-administered medications for this visit.  , Allergies:  Allergies  Allergen Reactions  . Morphine And Related Other (See Comments)    Abdominal Pain  Past Medical History, Surgical history, Social history, and Family History were reviewed and updated.   Physical Exam: Blood pressure 110/80, pulse 56, temperature 98.8 F (37.1 C), temperature source Oral, resp. rate 18, height '5\' 5"'  (1.651 m), weight 112 lb 9.6 oz (51.075 kg), SpO2 100 %. ECOG: 1 General appearance: alert, awake woman appeared comfortable. Head: Normocephalic, without obvious abnormality no oral thrush. Neck: no adenopathy Lymph nodes: Cervical,  supraclavicular, and axillary nodes normal. Heart:regular rate and rhythm, S1, S2 normal, no murmur, click, rub or gallop Lung:chest clear, no wheezing, rales, normal symmetric air entry Abdomen: soft, non-tender, without masses or organomegaly EXT:no evidence of edma. Full range of motion noted on the left hip. Skin: No rash or desquamation.   Lab Results: Lab Results  Component Value Date   WBC 4.7 04/04/2015   HGB 11.0* 04/04/2015   HCT 33.7* 04/04/2015   MCV 96.2 04/04/2015   PLT 170 04/04/2015    Results for AMIRRA, HERLING (MRN [id number]) as of 04/10/2015 15:29  Ref. Range 01/31/2015 15:26 04/04/2015 13:26  Kappa free light chain Latest Ref Range: 0.33-1.94 mg/dL 171.00 (H) 258.00 (H)  Lambda Free Lght Chn Latest Ref Range: 0.57-2.63 mg/dL 0.39 (L) 0.25 (L)  Kappa:Lambda Ratio Latest Ref Range: 0.26-1.65  438.46 (H) *    Impression and Plan:  A 81 year old female with the following issues. 1. History of a plasmacytoma of the right femur.  She is status post surgical fixation followed by external beam radiation. She is experiencing increased pain in her right hip which is chronic in nature. She had imaging studies in April 2016 and showed no major changes. She will have x-rays performed in the near future to ensure no cancer relapse. 2. 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  She has been off Revlimid since 03/2012. Her kappa free light chain has been increasing recently and likely will warrant treatment in the near future. I discussed this with her extensively and options of treatments would include Revlimid, Velcade among others. For the time being she elected to proceed with observation given the fact that she is asymptomatic and agreeable to proceed with staging laboratory testing and x-rays and 46 weeks and make a decision on treatment at that time. 3. Bony disease.  Continue to be on calcium and vitamin D supplements.   We will considering starting bisphosphonates and her disease had progressed. 4. Right hip pain: Seems to be controlled with oxycodone which she uses very rarely at this time. I gave her a refill on her prescription today. 5. Follow up in 6 weeks sooner if needed to.      Zola Button, MD 10/20/20163:41 PM

## 2015-04-28 ENCOUNTER — Encounter: Payer: Self-pay | Admitting: *Deleted

## 2015-05-05 ENCOUNTER — Ambulatory Visit (HOSPITAL_COMMUNITY): Payer: Medicare Other

## 2015-05-05 ENCOUNTER — Ambulatory Visit (HOSPITAL_COMMUNITY): Admission: RE | Admit: 2015-05-05 | Payer: Medicare Other | Source: Ambulatory Visit

## 2015-05-27 ENCOUNTER — Ambulatory Visit (HOSPITAL_COMMUNITY): Payer: Medicare Other

## 2015-05-27 ENCOUNTER — Other Ambulatory Visit: Payer: Medicare Other

## 2015-05-30 ENCOUNTER — Ambulatory Visit (HOSPITAL_COMMUNITY)
Admission: RE | Admit: 2015-05-30 | Discharge: 2015-05-30 | Disposition: A | Discharge status: Home / Self Care | Payer: Medicare Other | Source: Ambulatory Visit | Attending: Oncology | Admitting: Oncology

## 2015-05-30 ENCOUNTER — Other Ambulatory Visit (HOSPITAL_BASED_OUTPATIENT_CLINIC_OR_DEPARTMENT_OTHER): Payer: Medicare Other

## 2015-05-30 DIAGNOSIS — C9002 Multiple myeloma in relapse: Secondary | ICD-10-CM

## 2015-05-30 DIAGNOSIS — Z8579 Personal history of other malignant neoplasms of lymphoid, hematopoietic and related tissues: Secondary | ICD-10-CM | POA: Diagnosis not present

## 2015-05-30 LAB — CBC WITH DIFFERENTIAL/PLATELET
BASO%: 0.5 % (ref 0.0–2.0)
Basophils Absolute: 0 10*3/uL (ref 0.0–0.1)
EOS%: 1.9 % (ref 0.0–7.0)
Eosinophils Absolute: 0.1 10*3/uL (ref 0.0–0.5)
HCT: 32.8 % — ABNORMAL LOW (ref 34.8–46.6)
HEMOGLOBIN: 10.6 g/dL — AB (ref 11.6–15.9)
LYMPH%: 30.3 % (ref 14.0–49.7)
MCH: 31.1 pg (ref 25.1–34.0)
MCHC: 32.3 g/dL (ref 31.5–36.0)
MCV: 96.2 fL (ref 79.5–101.0)
MONO#: 0.3 10*3/uL (ref 0.1–0.9)
MONO%: 4.5 % (ref 0.0–14.0)
NEUT%: 62.8 % (ref 38.4–76.8)
NEUTROS ABS: 3.7 10*3/uL (ref 1.5–6.5)
Platelets: 174 10*3/uL (ref 145–400)
RBC: 3.41 10*6/uL — ABNORMAL LOW (ref 3.70–5.45)
RDW: 12.7 % (ref 11.2–14.5)
WBC: 5.9 10*3/uL (ref 3.9–10.3)
lymph#: 1.8 10*3/uL (ref 0.9–3.3)

## 2015-05-30 LAB — COMPREHENSIVE METABOLIC PANEL
ALBUMIN: 3.6 g/dL (ref 3.5–5.0)
ALK PHOS: 137 U/L (ref 40–150)
ALT: 16 U/L (ref 0–55)
AST: 21 U/L (ref 5–34)
Anion Gap: 12 mEq/L — ABNORMAL HIGH (ref 3–11)
BUN: 27 mg/dL — AB (ref 7.0–26.0)
CO2: 21 mEq/L — ABNORMAL LOW (ref 22–29)
CREATININE: 1 mg/dL (ref 0.6–1.1)
Calcium: 9.7 mg/dL (ref 8.4–10.4)
Chloride: 109 mEq/L (ref 98–109)
EGFR: 59 mL/min/{1.73_m2} — ABNORMAL LOW (ref >90)
GLUCOSE: 93 mg/dL (ref 70–140)
POTASSIUM: 4.1 meq/L (ref 3.5–5.1)
SODIUM: 142 meq/L (ref 136–145)
TOTAL PROTEIN: 7.3 g/dL (ref 6.4–8.3)
Total Bilirubin: 0.81 mg/dL (ref 0.20–1.20)

## 2015-06-03 DIAGNOSIS — M8580 Other specified disorders of bone density and structure, unspecified site: Secondary | ICD-10-CM | POA: Diagnosis not present

## 2015-06-03 DIAGNOSIS — Z4789 Encounter for other orthopedic aftercare: Secondary | ICD-10-CM | POA: Diagnosis not present

## 2015-06-03 DIAGNOSIS — C9030 Solitary plasmacytoma not having achieved remission: Secondary | ICD-10-CM | POA: Diagnosis not present

## 2015-06-03 DIAGNOSIS — Z9889 Other specified postprocedural states: Secondary | ICD-10-CM | POA: Diagnosis not present

## 2015-06-03 DIAGNOSIS — I708 Atherosclerosis of other arteries: Secondary | ICD-10-CM | POA: Diagnosis not present

## 2015-06-03 DIAGNOSIS — M169 Osteoarthritis of hip, unspecified: Secondary | ICD-10-CM | POA: Diagnosis not present

## 2015-06-03 DIAGNOSIS — M1611 Unilateral primary osteoarthritis, right hip: Secondary | ICD-10-CM | POA: Diagnosis not present

## 2015-06-03 LAB — SPEP & IFE WITH QIG
ABNORMAL PROTEIN BAND1: 1.3 g/dL
ALPHA-1-GLOBULIN: 0.3 g/dL (ref 0.2–0.3)
ALPHA-2-GLOBULIN: 0.6 g/dL (ref 0.5–0.9)
Albumin ELP: 3.4 g/dL — ABNORMAL LOW (ref 3.8–4.8)
Beta 2: 0.2 g/dL (ref 0.2–0.5)
Beta Globulin: 0.4 g/dL (ref 0.4–0.6)
GAMMA GLOBULIN: 1.6 g/dL (ref 0.8–1.7)
IgA: 35 mg/dL — ABNORMAL LOW (ref 69–380)
IgG (Immunoglobin G), Serum: 465 mg/dL — ABNORMAL LOW (ref 690–1700)
IgM, Serum: <5 mg/dL — ABNORMAL LOW (ref 52–322)
Total Protein, Serum Electrophoresis: 6.4 g/dL (ref 6.1–8.1)

## 2015-06-04 ENCOUNTER — Telehealth: Payer: Self-pay | Admitting: Oncology

## 2015-06-04 ENCOUNTER — Ambulatory Visit (HOSPITAL_BASED_OUTPATIENT_CLINIC_OR_DEPARTMENT_OTHER): Payer: Medicare Other | Admitting: Oncology

## 2015-06-04 VITALS — BP 178/62 | HR 64 | Temp 98.4°F | Resp 18 | Ht 65.0 in | Wt 115.6 lb

## 2015-06-04 DIAGNOSIS — C9000 Multiple myeloma not having achieved remission: Secondary | ICD-10-CM | POA: Diagnosis not present

## 2015-06-04 DIAGNOSIS — G8929 Other chronic pain: Secondary | ICD-10-CM | POA: Diagnosis not present

## 2015-06-04 DIAGNOSIS — M25551 Pain in right hip: Secondary | ICD-10-CM

## 2015-06-04 NOTE — Progress Notes (Signed)
Hematology and Oncology Follow Up Visit  Kayla Brooks [id number] Jun 29, 1929 81 y.o. 06/04/2015 3:37 PM  CC: Kayla Ser, MD  Kayla Brooks. Kayla Blalock, MD, [facility]  Kayla Brooks, M.D., Ph.D.    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  She had a light chain multiple myeloma with M spike of about 0.35 gm/dL.  Prior Therapy: 1. Status post prophylactic fixation impending pathological fracture of the right femur done in August 2009. 2. External beam radiation concluded in 2010 at the right femur. 3. Revlimid 25 mg daily as maintenance therapy for 2 years ending 03/2012.  Current therapy:  Watchful observation.   Interim History:  [name] presents today for a followup visit. Since her last visit, she reports no recent complaints. Her hip pain seems to be fairly manageable and intermittent in nature. Is not interfering with her function at this time. She is able to ambulate and sleep without any difficulties. She does not use any cane or a walker. She is public transportation to make her appointments.  Pain is not radiating and not associated with any neurological symptoms. She continues to deny any pathological fractures or falls.   Her appetite remains adequate although she lost 2 pounds since last visit. She does not report any dysphagia or odynophagia. Does not report any other constitutional symptoms.  She does not report any headaches, blurry vision or syncope. She does not report any fevers or chills or sweats. She had not had any nausea, not had any abdominal distension. Has not reported any chest pain or shortness of breath. Is not reporting any change in her bowel habits. She does not report any urinary symptoms of frequency urgency or hesitancy. Remainder of her review of systems unremarkable.   Medications: I have reviewed the patient's  current medications.  Current Outpatient Prescriptions  Medication Sig Dispense Refill  . amLODipine (NORVASC) 5 MG tablet Take 1 tablet (5 mg total) by mouth daily. 90 tablet 3  . aspirin 81 MG tablet Take 1 tablet (81 mg total) by mouth daily. 31 tablet 11  . Calcium Carbonate-Vitamin D (CALCIUM 600+D) 600-400 MG-UNIT per tablet Take 1 tablet by mouth daily. 30 tablet 3  . docusate sodium (COLACE) 100 MG capsule Take 1 capsule (100 mg total) by mouth daily as needed. 30 capsule 1  . lisinopril (PRINIVIL,ZESTRIL) 40 MG tablet Take 1 tablet (40 mg total) by mouth daily. 90 tablet 3  . nitroGLYCERIN (NITROSTAT) 0.4 MG SL tablet Place 1 tablet (0.4 mg total) under the tongue every 5 (five) minutes as needed for chest pain. 90 tablet 5  . omeprazole (PRILOSEC) 20 MG capsule Take 1 capsule (20 mg total) by mouth daily. 30 capsule 2  . oxybutynin (DITROPAN) 5 MG tablet Take 1 tablet (5 mg total) by mouth 2 (two) times daily. 60 tablet 1  . oxyCODONE (OXY IR/ROXICODONE) 5 MG immediate release tablet Take 1 tablet (5 mg total) by mouth every 4 (four) hours as needed for severe pain. 30 tablet 0  . simvastatin (ZOCOR) 20 MG tablet take 1 tablet by mouth at bedtime 30 tablet 5   No current facility-administered medications for this visit.  , Allergies:  Allergies  Allergen Reactions  . Morphine And Related Other (See Comments)    Abdominal Pain    Past Medical History, Surgical history, Social history, and Family History were  reviewed and updated.   Physical Exam: Blood pressure 178/62, pulse 64, temperature 98.4 F (36.9 C), temperature source Oral, resp. rate 18, height _0  (1.651 m), weight 115 lb 9.6 oz (52.436 kg), SpO2 100 %. ECOG: 1 General appearance: alert, awake woman not in any distress. Head: Normocephalic, without obvious abnormality no oral ulcers or lesions. Neck: no adenopathy Lymph nodes: Cervical, supraclavicular, and axillary nodes normal. Heart:regular rate and rhythm,  S1, S2 normal, no murmur, click, rub or gallop Lung:chest clear, no wheezing, rales, normal symmetric air entry Abdomen: soft, non-tender, without masses or organomegaly no shifting dullness or ascites. EXT:no evidence of edma.  Skin: No rash or desquamation.   Lab Results: Lab Results  Component Value Date   WBC 5.9 05/30/2015   HGB 10.6* 05/30/2015   HCT 32.8* 05/30/2015   MCV 96.2 05/30/2015   PLT 174 05/30/2015    Results for Kayla Brooks (MRN [id number]) as of 06/04/2015 15:21  Ref. Range 01/31/2015 15:26 04/04/2015 13:26  Kappa free light chain Latest Ref Range: 0.33-1.94 mg/dL 171.00 (H) 258.00 (H)          EXAM: METASTATIC BONE SURVEY  COMPARISON: 09/23/2014  FINDINGS: Edentulous with marked alveolar ridge atrophy.  Usual for age degenerative changes throughout the spine, with mild scoliosis. A T3 superior endplate concavity is chronic and stable.  No evidence of acute cardiopulmonary disease. Nipple shadow noted on the right.  No acute finding in the abdomen. Cholelithiasis. Atherosclerosis.  Well seated fixation hardware in the right femur. Hip osteoarthritis, asymmetrically advanced on the right with bone-on-bone contact.  IMPRESSION: No myelomatous focus identified. Stable study since 09/23/2014.      Impression and Plan:  A 81 year old female with the following issues. 1. History of a plasmacytoma of the right femur.  She is status post surgical fixation followed by external beam radiation. No major changes on recent x-rays. 2. 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  She has been off Revlimid since 03/2012. Her kappa free light chain has been increasing recently up to 258 on 04/04/2015. Her skeletal survey does not reveal any bony lesions and she is asymptomatic at this time. The plan is to continue with observation and surveillance and institute therapy upon symptomatic  progression. 3. Bony disease.  Continue to be on calcium and vitamin D supplements.  We will considering starting bisphosphonates and her disease had progressed. 4. Right hip pain: Seems to be controlled with oxycodone. She is using it in frequently and has not interfered with her function. 5. Follow up in 8 weeks sooner if needed to.      Zola Button, MD 12/14/20163:37 PM

## 2015-06-04 NOTE — Telephone Encounter (Signed)
per pof to sch pt appt-gve pt copy of avs °

## 2015-06-10 ENCOUNTER — Observation Stay (HOSPITAL_COMMUNITY)
Admission: EM | Admit: 2015-06-10 | Discharge: 2015-06-11 | Disposition: A | Discharge status: Home / Self Care | Payer: Medicare Other | Attending: Student in an Organized Health Care Education/Training Program | Admitting: Student in an Organized Health Care Education/Training Program

## 2015-06-10 ENCOUNTER — Encounter (HOSPITAL_COMMUNITY): Payer: Self-pay

## 2015-06-10 ENCOUNTER — Emergency Department (HOSPITAL_COMMUNITY): Payer: Medicare Other

## 2015-06-10 DIAGNOSIS — I700 Atherosclerosis of aorta: Secondary | ICD-10-CM | POA: Insufficient documentation

## 2015-06-10 DIAGNOSIS — Z8579 Personal history of other malignant neoplasms of lymphoid, hematopoietic and related tissues: Secondary | ICD-10-CM | POA: Diagnosis not present

## 2015-06-10 DIAGNOSIS — R0781 Pleurodynia: Principal | ICD-10-CM | POA: Insufficient documentation

## 2015-06-10 DIAGNOSIS — R079 Chest pain, unspecified: Secondary | ICD-10-CM | POA: Insufficient documentation

## 2015-06-10 DIAGNOSIS — I10 Essential (primary) hypertension: Secondary | ICD-10-CM

## 2015-06-10 DIAGNOSIS — E785 Hyperlipidemia, unspecified: Secondary | ICD-10-CM | POA: Diagnosis not present

## 2015-06-10 DIAGNOSIS — Z87891 Personal history of nicotine dependence: Secondary | ICD-10-CM | POA: Diagnosis not present

## 2015-06-10 DIAGNOSIS — K8020 Calculus of gallbladder without cholecystitis without obstruction: Secondary | ICD-10-CM | POA: Diagnosis not present

## 2015-06-10 DIAGNOSIS — R0602 Shortness of breath: Secondary | ICD-10-CM | POA: Insufficient documentation

## 2015-06-10 DIAGNOSIS — M1611 Unilateral primary osteoarthritis, right hip: Secondary | ICD-10-CM | POA: Diagnosis not present

## 2015-06-10 DIAGNOSIS — D649 Anemia, unspecified: Secondary | ICD-10-CM | POA: Diagnosis not present

## 2015-06-10 DIAGNOSIS — Z885 Allergy status to narcotic agent status: Secondary | ICD-10-CM | POA: Diagnosis not present

## 2015-06-10 DIAGNOSIS — I440 Atrioventricular block, first degree: Secondary | ICD-10-CM | POA: Diagnosis not present

## 2015-06-10 DIAGNOSIS — Z7982 Long term (current) use of aspirin: Secondary | ICD-10-CM | POA: Diagnosis not present

## 2015-06-10 HISTORY — DX: Hyperlipidemia, unspecified: E78.5

## 2015-06-10 HISTORY — DX: Frequency of micturition: R35.0

## 2015-06-10 HISTORY — DX: Unspecified osteoarthritis, unspecified site: M19.90

## 2015-06-10 HISTORY — DX: Solitary plasmacytoma not having achieved remission: C90.30

## 2015-06-10 HISTORY — DX: Anemia, unspecified: D64.9

## 2015-06-10 HISTORY — DX: Gastro-esophageal reflux disease without esophagitis: K21.9

## 2015-06-10 HISTORY — DX: Pneumonia, unspecified organism: J18.9

## 2015-06-10 HISTORY — DX: Noninfective gastroenteritis and colitis, unspecified: K52.9

## 2015-06-10 LAB — CBC WITH DIFFERENTIAL/PLATELET
BASOS ABS: 0 10*3/uL (ref 0.0–0.1)
BASOS PCT: 0 %
EOS PCT: 2 %
Eosinophils Absolute: 0.1 10*3/uL (ref 0.0–0.7)
HCT: 31.2 % — ABNORMAL LOW (ref 36.0–46.0)
Hemoglobin: 10.3 g/dL — ABNORMAL LOW (ref 12.0–15.0)
Lymphocytes Relative: 22 %
Lymphs Abs: 1 10*3/uL (ref 0.7–4.0)
MCH: 31.8 pg (ref 26.0–34.0)
MCHC: 33 g/dL (ref 30.0–36.0)
MCV: 96.3 fL (ref 78.0–100.0)
MONO ABS: 0.2 10*3/uL (ref 0.1–1.0)
MONOS PCT: 5 %
Neutro Abs: 3 10*3/uL (ref 1.7–7.7)
Neutrophils Relative %: 71 %
PLATELETS: 157 10*3/uL (ref 150–400)
RBC: 3.24 MIL/uL — ABNORMAL LOW (ref 3.87–5.11)
RDW: 12.2 % (ref 11.5–15.5)
WBC: 4.3 10*3/uL (ref 4.0–10.5)

## 2015-06-10 LAB — BASIC METABOLIC PANEL
ANION GAP: 7 (ref 5–15)
BUN: 22 mg/dL — ABNORMAL HIGH (ref 6–20)
CALCIUM: 9 mg/dL (ref 8.9–10.3)
CO2: 25 mmol/L (ref 22–32)
CREATININE: 0.94 mg/dL (ref 0.44–1.00)
Chloride: 112 mmol/L — ABNORMAL HIGH (ref 101–111)
GFR, EST NON AFRICAN AMERICAN: 55 mL/min — AB (ref >60)
GLUCOSE: 92 mg/dL (ref 65–99)
Potassium: 4.1 mmol/L (ref 3.5–5.1)
Sodium: 144 mmol/L (ref 135–145)

## 2015-06-10 LAB — I-STAT TROPONIN, ED
TROPONIN I, POC: 0.07 ng/mL (ref 0.00–0.08)
Troponin i, poc: 0.01 ng/mL (ref 0.00–0.08)

## 2015-06-10 LAB — TROPONIN I

## 2015-06-10 MED ORDER — ENOXAPARIN SODIUM 40 MG/0.4ML ~~LOC~~ SOLN
40.0000 mg | SUBCUTANEOUS | Status: DC
  Administered 2015-06-10: 40 mg via SUBCUTANEOUS
  Filled 2015-06-10: qty 0.4

## 2015-06-10 MED ORDER — SODIUM CHLORIDE 0.9 % IV BOLUS (SEPSIS)
250.0000 mL | Freq: Once | INTRAVENOUS | Status: AC
  Administered 2015-06-10: 250 mL via INTRAVENOUS

## 2015-06-10 MED ORDER — AMLODIPINE BESYLATE 5 MG PO TABS
5.0000 mg | ORAL_TABLET | Freq: Every day | ORAL | Status: DC
  Administered 2015-06-10 – 2015-06-11 (×2): 5 mg via ORAL
  Filled 2015-06-10 (×2): qty 1

## 2015-06-10 MED ORDER — HYDROCODONE-ACETAMINOPHEN 5-325 MG PO TABS: 1.0000 | ORAL_TABLET | Freq: Four times a day (QID) | ORAL | Status: DC | PRN

## 2015-06-10 MED ORDER — GI COCKTAIL ~~LOC~~: 30.0000 mL | Freq: Four times a day (QID) | ORAL | Status: DC | PRN

## 2015-06-10 MED ORDER — KETOROLAC TROMETHAMINE 15 MG/ML IJ SOLN
15.0000 mg | Freq: Four times a day (QID) | INTRAMUSCULAR | Status: DC | PRN
  Administered 2015-06-10: 15 mg via INTRAVENOUS
  Filled 2015-06-10: qty 1

## 2015-06-10 MED ORDER — ONDANSETRON HCL 4 MG/2ML IJ SOLN: 4.0000 mg | Freq: Four times a day (QID) | INTRAMUSCULAR | Status: DC | PRN

## 2015-06-10 MED ORDER — ACETAMINOPHEN 325 MG PO TABS
650.0000 mg | ORAL_TABLET | ORAL | Status: DC | PRN
  Administered 2015-06-11: 650 mg via ORAL
  Filled 2015-06-10: qty 2

## 2015-06-10 MED ORDER — OXYCODONE HCL 5 MG PO TABS: 5.0000 mg | ORAL_TABLET | ORAL | Status: DC | PRN

## 2015-06-10 MED ORDER — NITROGLYCERIN 0.4 MG SL SUBL: 0.4000 mg | SUBLINGUAL_TABLET | SUBLINGUAL | Status: DC | PRN

## 2015-06-10 MED ORDER — ONDANSETRON HCL 4 MG/2ML IJ SOLN
4.0000 mg | Freq: Once | INTRAMUSCULAR | Status: AC
  Administered 2015-06-10: 4 mg via INTRAVENOUS
  Filled 2015-06-10: qty 2

## 2015-06-10 MED ORDER — CALCIUM CARBONATE-VITAMIN D 500-200 MG-UNIT PO TABS
1.0000 | ORAL_TABLET | Freq: Every day | ORAL | Status: DC
  Administered 2015-06-11: 1 via ORAL
  Filled 2015-06-10: qty 1

## 2015-06-10 MED ORDER — SODIUM CHLORIDE 0.9 % IV SOLN
INTRAVENOUS | Status: DC
Start: 2015-06-10 — End: 2015-06-11
  Administered 2015-06-10 (×2): via INTRAVENOUS

## 2015-06-10 MED ORDER — HYDROMORPHONE HCL 1 MG/ML IJ SOLN
0.5000 mg | Freq: Once | INTRAMUSCULAR | Status: AC
  Administered 2015-06-10: 0.5 mg via INTRAVENOUS
  Filled 2015-06-10: qty 1

## 2015-06-10 MED ORDER — PANTOPRAZOLE SODIUM 40 MG PO TBEC
40.0000 mg | DELAYED_RELEASE_TABLET | Freq: Every day | ORAL | Status: DC
  Administered 2015-06-10 – 2015-06-11 (×2): 40 mg via ORAL
  Filled 2015-06-10 (×2): qty 1

## 2015-06-10 MED ORDER — SIMVASTATIN 20 MG PO TABS
20.0000 mg | ORAL_TABLET | Freq: Every day | ORAL | Status: DC
  Administered 2015-06-10: 20 mg via ORAL
  Filled 2015-06-10: qty 1

## 2015-06-10 MED ORDER — OXYBUTYNIN CHLORIDE 5 MG PO TABS
5.0000 mg | ORAL_TABLET | Freq: Two times a day (BID) | ORAL | Status: DC
  Administered 2015-06-10 – 2015-06-11 (×2): 5 mg via ORAL
  Filled 2015-06-10 (×2): qty 1

## 2015-06-10 MED ORDER — ASPIRIN EC 81 MG PO TBEC
81.0000 mg | DELAYED_RELEASE_TABLET | Freq: Every day | ORAL | Status: DC
  Administered 2015-06-10 – 2015-06-11 (×2): 81 mg via ORAL
  Filled 2015-06-10 (×2): qty 1

## 2015-06-10 NOTE — ED Notes (Signed)
Pt here for chest pain and shortness of breath. Pt reports chest pain initially began under her left breast 2 days ago but pain became sharp today. Pt reports associated shortness of breath. Pt takes HTN meds at home. No distress noted.

## 2015-06-10 NOTE — H&P (Signed)
Date: 06/10/2015               Patient Name:  Kayla Brooks MRN: [id number]  DOB: 1929/11/07 Age / Sex: 81 y.o., female   PCP: Axel Filler, MD         Medical Service: Internal Medicine Teaching Service         Attending Physician: Dr. Axel Filler, MD    First Contact: Dr. Charlynn Grimes Pager: [phone]  Second Contact: Dr. Randell Patient Pager: [phone]       After Hours (After 5p/  First Contact Pager: [phone]  weekends / holidays): Second Contact Pager: [phone]   Chief Complaint: Left sided chest pain  History of Present Illness: Ms. 81 year old female with a past medical history of multiple myeloma s/p treatment with Revlimid since 03/2012 followed by Dr. Alen Blew presenting with left sided chest pain. She reports that over the past 2-3 days she has had progressively worsening left sided pleuritic chest pain on the underside of her left breast. She reports the pain is a sharp-stabbing pain that is 10/10. It is worsened by coughing and taking a deep breath. Denies any trauma to the area. The pain does not radiate anywhere. She has not tried anything to relieve the pain. She denies any shortness of breath but is unable to take deep breaths due to pain. Denies any fevers, chills, cough, palpations, nausea, vomiting, abdominal pain. Reports chronic diarrhea.   Vital signs were stable in the ED with no tachycardia, tachypnea, satting well on room air. CXR done in the ED negative for PNA, pneumothroax, pulmonary edema or acute bony abnormalities. Initial troponin negative. EKG with no acute changes.   Meds: Current Facility-Administered Medications  Medication Dose Route Frequency Provider Last Rate Last Dose  . 0.9 %  sodium chloride infusion   Intravenous Continuous Fredia Sorrow, MD 75 mL/hr at 06/10/15 1149    . acetaminophen (TYLENOL) tablet 650 mg  650 mg Oral Q4H PRN Milagros Loll, MD      . amLODipine (NORVASC) tablet 5 mg  5 mg Oral Daily Milagros Loll, MD      .  aspirin EC tablet 81 mg  81 mg Oral Daily Milagros Loll, MD      . Calcium Carbonate-Vitamin D 600-400 MG-UNIT 1 tablet  1 tablet Oral Daily Milagros Loll, MD      . enoxaparin (LOVENOX) injection 40 mg  40 mg Subcutaneous Q24H Milagros Loll, MD      . gi cocktail (Maalox,Lidocaine,Donnatal)  30 mL Oral QID PRN Milagros Loll, MD      . ketorolac (TORADOL) 15 MG/ML injection 15 mg  15 mg Intravenous Q6H PRN Milagros Loll, MD      . nitroGLYCERIN (NITROSTAT) SL tablet 0.4 mg  0.4 mg Sublingual Q5 min PRN Milagros Loll, MD      . ondansetron [facility]) injection 4 mg  4 mg Intravenous Q6H PRN Milagros Loll, MD      . oxybutynin (DITROPAN) tablet 5 mg  5 mg Oral BID Milagros Loll, MD      . oxyCODONE (Oxy IR/ROXICODONE) immediate release tablet 5 mg  5 mg Oral Q4H PRN Milagros Loll, MD      . pantoprazole (PROTONIX) EC tablet 40 mg  40 mg Oral Daily Milagros Loll, MD      . simvastatin (ZOCOR) tablet 20 mg  20 mg Oral QHS Milagros Loll, MD  Current Outpatient Prescriptions  Medication Sig Dispense Refill  . amLODipine (NORVASC) 5 MG tablet Take 1 tablet (5 mg total) by mouth daily. 90 tablet 3  . aspirin 81 MG tablet Take 1 tablet (81 mg total) by mouth daily. 31 tablet 11  . Calcium Carbonate-Vitamin D (CALCIUM 600+D) 600-400 MG-UNIT per tablet Take 1 tablet by mouth daily. 30 tablet 3  . docusate sodium (COLACE) 100 MG capsule Take 1 capsule (100 mg total) by mouth daily as needed. 30 capsule 1  . lisinopril (PRINIVIL,ZESTRIL) 40 MG tablet Take 1 tablet (40 mg total) by mouth daily. 90 tablet 3  . omeprazole (PRILOSEC) 20 MG capsule Take 1 capsule (20 mg total) by mouth daily. 30 capsule 2  . oxybutynin (DITROPAN) 5 MG tablet Take 1 tablet (5 mg total) by mouth 2 (two) times daily. 60 tablet 1  . oxyCODONE (OXY IR/ROXICODONE) 5 MG immediate release tablet Take 1 tablet (5 mg total) by mouth every 4 (four) hours as needed for severe pain. 30 tablet 0  .  simvastatin (ZOCOR) 20 MG tablet take 1 tablet by mouth at bedtime 30 tablet 5  . HYDROcodone-acetaminophen (NORCO/VICODIN) 5-325 MG tablet Take 1 tablet by mouth every 6 (six) hours as needed for moderate pain. 15 tablet 0  . nitroGLYCERIN (NITROSTAT) 0.4 MG SL tablet Place 1 tablet (0.4 mg total) under the tongue every 5 (five) minutes as needed for chest pain. 90 tablet 5    Allergies: Allergies as of 06/10/2015 - Review Complete 06/10/2015  Allergen Reaction Noted  . Morphine and related Other (See Comments) 04/06/2013   Past Medical History  Diagnosis Date  . Multiple myeloma without mention of remission   . Monoclonal paraproteinemia   . Hypertension    Past Surgical History  Procedure Laterality Date  . Multiple mye     History reviewed. No pertinent family history. Social History   Social History  . Marital Status: Widowed    Spouse Name: N/A  . Number of Children: N/A  . Years of Education: N/A   Occupational History  . Not on file.   Social History Main Topics  . Smoking status: Former Research scientist (life sciences)  . Smokeless tobacco: Not on file  . Alcohol Use: No  . Drug Use: No  . Sexual Activity: Not on file   Other Topics Concern  . Not on file   Social History Narrative    Review of Systems: Review of Systems  Constitutional: Negative for fever, chills and weight loss.  Eyes: Negative for blurred vision and double vision.  Respiratory: Positive for shortness of breath. Negative for cough, hemoptysis, sputum production and wheezing.   Cardiovascular: Positive for chest pain. Negative for palpitations.  Gastrointestinal: Positive for diarrhea. Negative for nausea, vomiting, abdominal pain, blood in stool and melena.  Genitourinary: Negative for dysuria.  Musculoskeletal: Negative for back pain and falls.  Skin: Negative for rash.  Neurological: Negative for dizziness and headaches.  Endo/Heme/Allergies: Negative for polydipsia.  Psychiatric/Behavioral: Negative for  depression.   Physical Exam: Blood pressure 166/84, pulse 50, temperature 97.7 F (36.5 C), temperature source Oral, resp. rate 16, height '5\' 5"'  (1.651 m), weight 117 lb (53.071 kg), SpO2 96 %. General: alert, well-developed, and cooperative to examination.  Head: normocephalic and atraumatic Eyes: vision grossly intact, pupils equal, pupils round, pupils reactive to light, no injection and anicteric.  Mouth: pharynx pink and moist, no erythema, and no exudates.  Neck: supple, full ROM, no thyromegaly, no JVD, and no carotid bruits.  Lungs: normal respiratory effort, no accessory muscle use, normal breath sounds, no crackles, and no wheezes. Heart: normal rate, regular rhythm, no murmur, no gallop, and no rub. No reproducible chest pain.  Abdomen: soft, non-tender, normal bowel sounds, no distention, no guarding, no rebound tenderness, no hepatomegaly, and no splenomegaly.  Msk: no joint swelling, no joint warmth, and no redness over joints  Pulses: 2+ DP/PT pulses bilaterally Extremities: No cyanosis, clubbing, edema Neurologic: alert & oriented X3, cranial nerves II-XII intact, strength normal in all extremities Skin: turgor normal and no rashes.  Psych: normal mood and affect  Lab results: Basic Metabolic Panel:  Recent Labs  06/10/15 1142  NA 144  K 4.1  CL 112*  CO2 25  GLUCOSE 92  BUN 22*  CREATININE 0.94  CALCIUM 9.0   Liver Function Tests: No results for input(s): AST, ALT, ALKPHOS, BILITOT, PROT, ALBUMIN in the last 72 hours. No results for input(s): LIPASE, AMYLASE in the last 72 hours. No results for input(s): AMMONIA in the last 72 hours. CBC:  Recent Labs  06/10/15 1142  WBC 4.3  NEUTROABS 3.0  HGB 10.3*  HCT 31.2*  MCV 96.3  PLT 157   Cardiac Enzymes: No results for input(s): CKTOTAL, CKMB, CKMBINDEX, TROPONINI in the last 72 hours. BNP: No results for input(s): PROBNP in the last 72 hours. D-Dimer: No results for input(s): DDIMER in the last 72  hours. CBG: No results for input(s): GLUCAP in the last 72 hours. Hemoglobin A1C: No results for input(s): HGBA1C in the last 72 hours. Fasting Lipid Panel: No results for input(s): CHOL, HDL, LDLCALC, TRIG, CHOLHDL, LDLDIRECT in the last 72 hours. Thyroid Function Tests: No results for input(s): TSH, T4TOTAL, FREET4, T3FREE, THYROIDAB in the last 72 hours. Anemia Panel: No results for input(s): VITAMINB12, FOLATE, FERRITIN, TIBC, IRON, RETICCTPCT in the last 72 hours. Coagulation: No results for input(s): LABPROT, INR in the last 72 hours. Urine Drug Screen: Drugs of Abuse  No results found for: LABOPIA, COCAINSCRNUR, LABBENZ, AMPHETMU, THCU, LABBARB  Alcohol Level: No results for input(s): ETH in the last 72 hours. Urinalysis: No results for input(s): COLORURINE, LABSPEC, PHURINE, GLUCOSEU, HGBUR, BILIRUBINUR, KETONESUR, PROTEINUR, UROBILINOGEN, NITRITE, LEUKOCYTESUR in the last 72 hours.  Invalid input(s): APPERANCEUR  Imaging results:  Dg Chest 2 View  06/10/2015  CLINICAL DATA:  Left-sided chest pain and shortness of breath for 3 days which markedly worsened this morning. Initial encounter. EXAM: CHEST  2 VIEW COMPARISON:  PA and lateral chest 07/20/2014.  CT chest 02/16/2010. FINDINGS: There is cardiomegaly without edema. The lungs are clear. No pneumothorax or pleural effusion. Aortic atherosclerosis is identified. No focal bony abnormality. IMPRESSION: No acute disease. Cardiomegaly. Atherosclerosis. Electronically Signed   By: Inge Rise M.D.   On: 06/10/2015 12:24   Other results: EKG: Sinus rhythm Left anterior fascicular block RSR' in V1 or V2, probably normal variant Borderline T wave abnormalities No  significant change since last tracing.  Assessment & Plan by Problem: Ms. Rolland is an 81 year old female presenting with 2-3 day history of progressively worsening pleuritic chest pain.   Chest Pain: Worsening pleuritic chest pain over the past 2-3 days. CXR in  the ED negative for PNA, pneumothroax, pulmonary edema or acute bony abnormalities. Initial troponin negative. Slight pump to 0.07 on repeat. EKG with no acute changes. No tachycardia, tachypnea, satting well on room air. No reproducible chest pain. Lungs clear bilaterally. No evidence of myelomatosis recurrence on recent skeletal survey on 12/9.  -CTA  to rule out PE -trend troponin -Repeat EKG in am -GI cocktail prn -Oxycodone IR 5 mg q4hr prn -Toradol 15 mg IV q6hr prn  -BMP/CBC in am  Hx of Multiple Myeloma: Followed by Dr. Alen Blew outpatient. 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 S/p surgical fixation followed by external beam radiation. 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 She has been off Revlimid since 03/2012. Her kappa free light chain has been increasing slowly up to 258 on 04/04/2015. Her skeletal survey on 05/30/2015 did not reveal any bony lesions.   Chronic anemia: Hgb 10.3. Baseline around 10-11. -CBC in am  HTN: BP 376-283T systolic in ED. Takes amlodipine 5 mg at home.  -Continue home meds  HLD: Simvastatin 20 mg PO daily  DVT PPx: Lovenox   Dispo: Disposition is deferred at this time, awaiting improvement of current medical problems.   The patient does have a current PCP Damita Dunnings Lorenda Ishihara, MD) and does need an [facility] hospital follow-up appointment after discharge.  The patient does not have transportation limitations that hinder transportation to clinic appointments.  Signed: Maryellen Pile, MD 06/10/2015, 6:56 PM

## 2015-06-10 NOTE — ED Provider Notes (Addendum)
CSN: [id number]     Arrival date & time 06/10/15  1037 History   First MD Initiated Contact with Patient 06/10/15 1039     Chief Complaint  Patient presents with  . Chest Pain  . Shortness of Breath     (Consider location/radiation/quality/duration/timing/severity/associated sxs/prior Treatment) Patient is a 81 y.o. female presenting with chest pain and shortness of breath. The history is provided by the patient.  Chest Pain Associated symptoms: cough and shortness of breath   Associated symptoms: no abdominal pain, no back pain, no fever and no headache   Shortness of Breath Associated symptoms: chest pain and cough   Associated symptoms: no abdominal pain, no fever, no headaches and no rash    patient with left-sided chest pain pleuritic in nature worse with movement worse with coughing sharp 10 out of 10 present for 2-3 days. No history of injury. Shortness of breath but mostly due to the pain. Pain is nonradiating.  Past Medical History  Diagnosis Date  . Multiple myeloma without mention of remission   . Monoclonal paraproteinemia   . Hypertension    Past Surgical History  Procedure Laterality Date  . Multiple mye     History reviewed. No pertinent family history. Social History  Substance Use Topics  . Smoking status: Former Research scientist (life sciences)  . Smokeless tobacco: None  . Alcohol Use: No   OB History    No data available     Review of Systems  Constitutional: Negative for fever.  HENT: Negative for congestion.   Eyes: Negative for visual disturbance.  Respiratory: Positive for cough and shortness of breath.   Cardiovascular: Positive for chest pain.  Gastrointestinal: Negative for abdominal pain.  Genitourinary: Negative for dysuria.  Musculoskeletal: Negative for back pain.  Skin: Negative for rash.  Neurological: Negative for headaches.  Hematological: Does not bruise/bleed easily.  Psychiatric/Behavioral: Negative for confusion.      Allergies  Morphine and  related  Home Medications   Prior to Admission medications   Medication Sig Start Date End Date Taking? Authorizing Provider  amLODipine (NORVASC) 5 MG tablet Take 1 tablet (5 mg total) by mouth daily. 11/13/14  Yes Oval Linsey, MD  aspirin 81 MG tablet Take 1 tablet (81 mg total) by mouth daily. 09/08/12  Yes Trish Fountain, MD  Calcium Carbonate-Vitamin D (CALCIUM 600+D) 600-400 MG-UNIT per tablet Take 1 tablet by mouth daily. 04/06/13  Yes Madilyn Fireman, MD  docusate sodium (COLACE) 100 MG capsule Take 1 capsule (100 mg total) by mouth daily as needed. 12/11/14 12/11/15 Yes Corky Sox, MD  lisinopril (PRINIVIL,ZESTRIL) 40 MG tablet Take 1 tablet (40 mg total) by mouth daily. 08/26/14  Yes Madilyn Fireman, MD  omeprazole (PRILOSEC) 20 MG capsule Take 1 capsule (20 mg total) by mouth daily. 01/02/15  Yes Juluis Mire, MD  oxybutynin (DITROPAN) 5 MG tablet Take 1 tablet (5 mg total) by mouth 2 (two) times daily. 01/02/15  Yes Marjan Rabbani, MD  oxyCODONE (OXY IR/ROXICODONE) 5 MG immediate release tablet Take 1 tablet (5 mg total) by mouth every 4 (four) hours as needed for severe pain. 04/10/15  Yes Wyatt Portela, MD  simvastatin (ZOCOR) 20 MG tablet take 1 tablet by mouth at bedtime 04/10/15  Yes Corky Sox, MD  HYDROcodone-acetaminophen (NORCO/VICODIN) 5-325 MG tablet Take 1 tablet by mouth every 6 (six) hours as needed for moderate pain. 06/10/15   Fredia Sorrow, MD  nitroGLYCERIN (NITROSTAT) 0.4 MG SL tablet Place 1 tablet (0.4 mg total) under  the tongue every 5 (five) minutes as needed for chest pain. 10/02/12   [place] Carollee Leitz, MD   BP 146/70 mmHg  Pulse 58  Temp(Src) 97.7 F (36.5 C) (Oral)  Resp 17  Ht '5\' 5"'  (1.651 m)  Wt 53.071 kg  BMI 19.47 kg/m2  SpO2 100% Physical Exam  Constitutional: She is oriented to person, place, and time. She appears well-developed and well-nourished. No distress.  HENT:  Head: Normocephalic and atraumatic.  Mouth/Throat: Oropharynx is  clear and moist.  Eyes: Conjunctivae and EOM are normal. Pupils are equal, round, and reactive to light.  Neck: Normal range of motion. Neck supple.  Cardiovascular: Normal rate, regular rhythm and normal heart sounds.   No murmur heard. Pulmonary/Chest: Effort normal and breath sounds normal. No respiratory distress.  Abdominal: Soft. Bowel sounds are normal. There is no tenderness.  Musculoskeletal: Normal range of motion. She exhibits no edema.  Neurological: She is alert and oriented to person, place, and time. No cranial nerve deficit. She exhibits normal muscle tone. Coordination normal.  Skin: Skin is warm. No rash noted.  Nursing note and vitals reviewed.   ED Course  Procedures (including critical care time) Labs Review Labs Reviewed  CBC WITH DIFFERENTIAL/PLATELET - Abnormal; Notable for the following:    RBC 3.24 (*)    Hemoglobin 10.3 (*)    HCT 31.2 (*)    All other components within normal limits  BASIC METABOLIC PANEL - Abnormal; Notable for the following:    Chloride 112 (*)    BUN 22 (*)    GFR calc non Af Amer 55 (*)    All other components within normal limits  I-STAT TROPOININ, ED  I-STAT TROPOININ, ED   Results for orders placed or performed during the hospital encounter of 06/10/15  CBC with Differential/Platelet  Result Value Ref Range   WBC 4.3 4.0 - 10.5 K/uL   RBC 3.24 (L) 3.87 - 5.11 MIL/uL   Hemoglobin 10.3 (L) 12.0 - 15.0 g/dL   HCT 31.2 (L) 36.0 - 46.0 %   MCV 96.3 78.0 - 100.0 fL   MCH 31.8 26.0 - 34.0 pg   MCHC 33.0 30.0 - 36.0 g/dL   RDW 12.2 11.5 - 15.5 %   Platelets 157 150 - 400 K/uL   Neutrophils Relative % 71 %   Neutro Abs 3.0 1.7 - 7.7 K/uL   Lymphocytes Relative 22 %   Lymphs Abs 1.0 0.7 - 4.0 K/uL   Monocytes Relative 5 %   Monocytes Absolute 0.2 0.1 - 1.0 K/uL   Eosinophils Relative 2 %   Eosinophils Absolute 0.1 0.0 - 0.7 K/uL   Basophils Relative 0 %   Basophils Absolute 0.0 0.0 - 0.1 K/uL  Basic metabolic panel  Result  Value Ref Range   Sodium 144 135 - 145 mmol/L   Potassium 4.1 3.5 - 5.1 mmol/L   Chloride 112 (H) 101 - 111 mmol/L   CO2 25 22 - 32 mmol/L   Glucose, Bld 92 65 - 99 mg/dL   BUN 22 (H) 6 - 20 mg/dL   Creatinine, Ser 0.94 0.44 - 1.00 mg/dL   Calcium 9.0 8.9 - 10.3 mg/dL   GFR calc non Af Amer 55 (L) >60 mL/min   GFR calc Af Amer >60 >60 mL/min   Anion gap 7 5 - 15  I-Stat Troponin, ED (not at [facility])  Result Value Ref Range   Troponin i, poc 0.01 0.00 - 0.08 ng/mL   Comment 3  Imaging Review Dg Chest 2 View  06/10/2015  CLINICAL DATA:  Left-sided chest pain and shortness of breath for 3 days which markedly worsened this morning. Initial encounter. EXAM: CHEST  2 VIEW COMPARISON:  PA and lateral chest 07/20/2014.  CT chest 02/16/2010. FINDINGS: There is cardiomegaly without edema. The lungs are clear. No pneumothorax or pleural effusion. Aortic atherosclerosis is identified. No focal bony abnormality. IMPRESSION: No acute disease. Cardiomegaly. Atherosclerosis. Electronically Signed   By: Inge Rise M.D.   On: 06/10/2015 12:24   I have personally reviewed and evaluated these images and lab results as part of my medical decision-making.   EKG Interpretation   Date/Time:  Tuesday June 10 2015 10:47:22 EST Ventricular Rate:  65 PR Interval:  199 QRS Duration: 102 QT Interval:  427 QTC Calculation: 444 R Axis:   -56 Text Interpretation:  Sinus rhythm Left anterior fascicular block RSR' in  V1 or V2, probably normal variant Borderline T wave abnormalities No  significant change since last tracing Confirmed by Reaghan Kawa  MD, Taila Basinski  [phone]) on 06/10/2015 10:49:32 AM      MDM   Final diagnoses:  Chest pain, unspecified chest pain type    Patient's chest pain is pleuritic in nature left side but fairly severe. She saturations are normal no tachycardia since rest of workup was negative EKG without acute changes chest x-ray negative for pneumonia pneumothorax or  pulmonary edema. Initial troponin was negative CT angios pending. As well as a repeat point of care even though chest pains been present for several days it was worse today. Clinically doubt that this is a acute cardiac chest pain more concerned for possible pulmonary embolus based on symptoms not based on vital signs. If CT angios negative patient can be discharged home with pain control.  Repeat troponin negative. However it is on the upper limits of normal for the point of care lab.  Fredia Sorrow, MD 06/10/15 [place], MD 06/10/15 1625  Fredia Sorrow, MD 06/10/15 1626  Addendum: Patient is followed by outpatient clinics. Patient with still normal point care troponin but rising still having chest discomfort problems. CT angios pending. Will consult outpatient clinics to evaluate the patient for consideration for admission overnight and serial cardiac enzymes.  Fredia Sorrow, MD 06/10/15 [phone]

## 2015-06-10 NOTE — ED Notes (Signed)
Meal tray ordered 

## 2015-06-11 DIAGNOSIS — R0781 Pleurodynia: Secondary | ICD-10-CM | POA: Diagnosis not present

## 2015-06-11 DIAGNOSIS — R079 Chest pain, unspecified: Secondary | ICD-10-CM | POA: Diagnosis not present

## 2015-06-11 DIAGNOSIS — I440 Atrioventricular block, first degree: Secondary | ICD-10-CM | POA: Diagnosis not present

## 2015-06-11 DIAGNOSIS — I700 Atherosclerosis of aorta: Secondary | ICD-10-CM | POA: Diagnosis not present

## 2015-06-11 DIAGNOSIS — R0602 Shortness of breath: Secondary | ICD-10-CM | POA: Diagnosis not present

## 2015-06-11 LAB — BASIC METABOLIC PANEL
ANION GAP: 6 (ref 5–15)
BUN: 10 mg/dL (ref 6–20)
CO2: 24 mmol/L (ref 22–32)
Calcium: 8.7 mg/dL — ABNORMAL LOW (ref 8.9–10.3)
Chloride: 110 mmol/L (ref 101–111)
Creatinine, Ser: 0.75 mg/dL (ref 0.44–1.00)
GLUCOSE: 89 mg/dL (ref 65–99)
POTASSIUM: 4.1 mmol/L (ref 3.5–5.1)
Sodium: 140 mmol/L (ref 135–145)

## 2015-06-11 LAB — CBC
HEMATOCRIT: 30 % — AB (ref 36.0–46.0)
HEMOGLOBIN: 10 g/dL — AB (ref 12.0–15.0)
MCH: 32.1 pg (ref 26.0–34.0)
MCHC: 33.3 g/dL (ref 30.0–36.0)
MCV: 96.2 fL (ref 78.0–100.0)
Platelets: 149 10*3/uL — ABNORMAL LOW (ref 150–400)
RBC: 3.12 MIL/uL — AB (ref 3.87–5.11)
RDW: 12.1 % (ref 11.5–15.5)
WBC: 3.7 10*3/uL — ABNORMAL LOW (ref 4.0–10.5)

## 2015-06-11 MED ORDER — IBUPROFEN 600 MG PO TABS: 600.0000 mg | ORAL_TABLET | Freq: Four times a day (QID) | ORAL | Status: DC | PRN

## 2015-06-11 MED ORDER — AMLODIPINE BESYLATE 5 MG PO TABS: 5.0000 mg | ORAL_TABLET | Freq: Every day | ORAL | Status: DC

## 2015-06-11 MED ORDER — NITROGLYCERIN 0.4 MG SL SUBL: 0.4000 mg | SUBLINGUAL_TABLET | SUBLINGUAL | Status: DC | PRN

## 2015-06-11 NOTE — Progress Notes (Signed)
Subjective: Kayla Brooks is doing well this morning. She reports that her chest pain has resolved overnight after receiving the Toradol dose. Denies any shortness of breath or cough.   Objective: Vital signs in last 24 hours: Filed Vitals:   06/10/15 2058 06/10/15 2059 06/10/15 2131 06/11/15 0522  BP: 152/67 152/67 143/63 125/50  Pulse:  63 63 52  Temp:   98.4 F (36.9 C) 98.3 F (36.8 C)  TempSrc:   Oral Oral  Resp:  '19 18 18  ' Height:   '5\' 5"'  (1.651 m)   Weight:   108 lb 7.5 oz (49.2 kg) 108 lb 7.5 oz (49.2 kg)  SpO2:  100% 100% 100%   Weight change:  No intake or output data in the 24 hours ending 06/11/15 0918   PHYSICAL  GENERAL- alert, co-operative, appears as stated age, not in any distress. HEENT- Atraumatic, normocephalic CARDIAC- RRR, no murmurs, rubs or gallops. RESP- Moving equal volumes of air, and clear to auscultation bilaterally, no wheezes or crackles. ABDOMEN- Soft, nontender, bowel sounds present. NEURO- No obvious Cr N abnormality. EXTREMITIES- pulse 2+, symmetric, no pedal edema. SKIN- Warm, dry, No rash or lesion. PSYCH- Normal mood and affect, appropriate thought content and speech.  Medications:  I have reviewed the patient's current medications. Prior to Admission:  Prescriptions prior to admission  Medication Sig Dispense Refill Last Dose  . amLODipine (NORVASC) 5 MG tablet Take 1 tablet (5 mg total) by mouth daily. 90 tablet 3 06/10/2015 at Unknown time  . aspirin 81 MG tablet Take 1 tablet (81 mg total) by mouth daily. 31 tablet 11 06/10/2015 at Unknown time  . Calcium Carbonate-Vitamin D (CALCIUM 600+D) 600-400 MG-UNIT per tablet Take 1 tablet by mouth daily. 30 tablet 3 06/09/2015 at Unknown time  . docusate sodium (COLACE) 100 MG capsule Take 1 capsule (100 mg total) by mouth daily as needed. 30 capsule 1 Past Month at Unknown time  . lisinopril (PRINIVIL,ZESTRIL) 40 MG tablet Take 1 tablet (40 mg total) by mouth daily. 90 tablet 3 06/10/2015  at Unknown time  . omeprazole (PRILOSEC) 20 MG capsule Take 1 capsule (20 mg total) by mouth daily. 30 capsule 2 06/09/2015 at Unknown time  . oxybutynin (DITROPAN) 5 MG tablet Take 1 tablet (5 mg total) by mouth 2 (two) times daily. 60 tablet 1 06/09/2015 at Unknown time  . oxyCODONE (OXY IR/ROXICODONE) 5 MG immediate release tablet Take 1 tablet (5 mg total) by mouth every 4 (four) hours as needed for severe pain. 30 tablet 0 Past Month at Unknown time  . simvastatin (ZOCOR) 20 MG tablet take 1 tablet by mouth at bedtime 30 tablet 5 06/09/2015 at Unknown time  . nitroGLYCERIN (NITROSTAT) 0.4 MG SL tablet Place 1 tablet (0.4 mg total) under the tongue every 5 (five) minutes as needed for chest pain. 90 tablet 5 unk   Scheduled Meds: . amLODipine  5 mg Oral Daily  . aspirin EC  81 mg Oral Daily  . calcium-vitamin D  1 tablet Oral Daily  . enoxaparin (LOVENOX) injection  40 mg Subcutaneous Q24H  . oxybutynin  5 mg Oral BID  . pantoprazole  40 mg Oral Daily  . simvastatin  20 mg Oral QHS   Continuous Infusions: . sodium chloride 75 mL/hr at 06/10/15 2155   PRN Meds:.acetaminophen, gi cocktail, ketorolac, nitroGLYCERIN, ondansetron (ZOFRAN) IV, oxyCODONE Assessment/Plan:  Atypical Chest Pain: Patient reports resolution of her chest pain overnight after receiving Toradol. CXR normal. No signs of infection.  Troponins negative x3. EKG NSR with 1st degree AV block but no ischemic changes. Vital signs are stable overnight with no tachycardia, tachypnea and satting 100% on room air. No evidence of myelomatosis recurrence on recent skeletal survey on 12/9. No evdicence of ACS at this time and low risk for angina pectoris. We were unable to get CTA overnight due to access issues but Wells score is 0 and low risk for PE. Most likely MSK in a nature. Will have her work with PT/OT and likely discharge later today.  -d/c Oxycodone IR 5 mg q4hr prn -d/c Toradol 15 mg IV q6hr prn  -Ibuprofen 600 mg PO q6hr  prn  Hx of Multiple Myeloma: Followed by Dr. Alen Blew outpatient. 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 S/p surgical fixation followed by external beam radiation. 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 She has been off Revlimid since 03/2012. Her kappa free light chain has been increasing slowly up to 258 on 04/04/2015. Her skeletal survey on 05/30/2015 did not reveal any bony lesions.   Chronic anemia: Hgb 10.3 on admission, stable at 10.0. Baseline around 10-11.  HTN: BP 125/50 this morning. Takes amlodipine 5 mg at home.  -Continue home meds  HLD: Simvastatin 20 mg PO daily  DVT PPx: Lovenox   Dispo: Likely discharge later today  The patient does have a current PCP Damita Dunnings Lorenda Ishihara, MD) and does need an [facility] hospital follow-up appointment after discharge.  The patient does not have transportation limitations that hinder transportation to clinic appointments.  .Services Needed at time of discharge: Y = Yes, Blank = No PT:   OT:   RN:   Equipment:   Other:       Maryellen Pile, MD IMTS PGY-1 [phone] 06/11/2015, 9:18 AM

## 2015-06-11 NOTE — Care Management Note (Addendum)
Case Management Note  Patient Details  Name: Kayla Brooks MRN: [id number] Date of Birth: [birth year]  Subjective/Objective:     Pt admitted with CP               Action/Plan:  Pt is from home alone, pt son at bedside   Expected Discharge Date:                  Expected Discharge Plan:  Home/Self Care (Pt is from home alone, independent with family support.  Pt sometimes uses cane at home)  In-House Referral:     Discharge planning Services  CM Consult  Post Acute Care Choice:    Choice offered to:     DME Arranged:    DME Agency:     HH Arranged:    HH Agency:     Status of Service:  In process, will continue to follow  Medicare Important Message Given:    Date Medicare IM Given:    Medicare IM give by:    Date Additional Medicare IM Given:    Additional Medicare Important Message give by:     If discussed at [place] of Stay Meetings, dates discussed:    Additional Comments: CM assessed pt prior to discharge, Pt refused HHPT as recommended, pt stated "I don't need it".   CM informed the pt of recommendation for home PT safety and evaluation; pt still refused.    Maryclare Labrador, RN 06/11/2015, 2:56 PM

## 2015-06-11 NOTE — Progress Notes (Signed)
Order rcvd to discharge.  Telemetry removed and CCMD notified.  IV removed with catheter intact.  Discharge education given to Pt.  Pt with home medications and verified doses and meds with Pt.  No medication changes noted.  Pt denies chest pain or sob.  Pt to discharge home with family.  Will cont to monitor.

## 2015-06-11 NOTE — Evaluation (Addendum)
Physical Therapy Evaluation Patient Details Name: REDITH DRACH MRN: [id number] DOB: 1930-06-11 Today's Date: 06/11/2015   History of Present Illness  81 year old woman with history of multiple myeloma was admitted for observation of chest pain.   Clinical Impression  Although pt is mildly unsteady initially on getting up, she is likely at baseline function and is generally safe and can be independent in her home-like environment.  Will sign off at this time.    Follow Up Recommendations Home health PT;Other (comment) (home safety eval and treat)    Equipment Recommendations  None recommended by PT    Recommendations for Other Services       Precautions / Restrictions        Mobility  Bed Mobility Overal bed mobility: Modified Independent Bed Mobility: Supine to Sit;Sit to Supine     Supine to sit: Modified independent (Device/Increase time) Sit to supine: Modified independent (Device/Increase time)      Transfers Overall transfer level: Modified independent   Transfers: Sit to/from Stand Sit to Stand: Modified independent (Device/Increase time)            Ambulation/Gait Ambulation/Gait assistance: Supervision Ambulation Distance (Feet): 380 Feet Assistive device: None Gait Pattern/deviations: Step-through pattern;Scissoring;Drifts right/left   Gait velocity interpretation: at or above normal speed for age/gender General Gait Details: mildly unsteady initially and gradually improving with distance.  AT first scissored and wandered to maintain balance  Stairs Stairs: Yes Stairs assistance: Supervision Stair Management: One rail Right;Alternating pattern;Forwards Number of Stairs: 5 General stair comments: safe with rail  Wheelchair Mobility    Modified Rankin (Stroke Patients Only)       Balance Overall balance assessment: Needs assistance Sitting-balance support: No upper extremity supported Sitting balance-Leahy Scale: Fair     Standing  balance support: No upper extremity supported Standing balance-Leahy Scale: Fair                 High Level Balance Comments: pt able to scan and change speeds without overt LOB, she did scissor on occasion              Pertinent Vitals/Pain Pain Assessment: Faces Faces Pain Scale: Hurts a little bit Pain Location: HA Pain Descriptors / Indicators: Aching Pain Intervention(s): RN gave pain meds during session    Home Living Family/patient expects to be discharged to:: Private residence Living Arrangements: Alone Available Help at Discharge: Friend(s);Family (children are not always available, but supportive) Type of Home: Apartment Home Access: Level entry     Home Layout: One level Home Equipment: Walker - 2 wheels;Cane - single point      Prior Function Level of Independence: Independent;Independent with assistive device(s)               Hand Dominance        Extremity/Trunk Assessment   Upper Extremity Assessment: Overall WFL for tasks assessed (weaker triceps)           Lower Extremity Assessment: Overall WFL for tasks assessed (proximal weakness)         Communication   Communication: No difficulties  Cognition Arousal/Alertness: Awake/alert Behavior During Therapy: WFL for tasks assessed/performed Overall Cognitive Status: Within Functional Limits for tasks assessed                      General Comments      Exercises        Assessment/Plan    PT Assessment Patent does not need any further PT services  PT  Diagnosis Generalized weakness   PT Problem List    PT Treatment Interventions     PT Goals (Current goals can be found in the Care Plan section) Acute Rehab PT Goals PT Goal Formulation: All assessment and education complete, DC therapy    Frequency     Barriers to discharge        Co-evaluation               End of Session   Activity Tolerance: Patient tolerated treatment well Patient left: in  bed;with call bell/phone within reach Nurse Communication: Mobility status    Functional Assessment Tool Used: clinical judgement Functional Limitation: Mobility: Walking and moving around Mobility: Walking and Moving Around Current Status (S3159): At least 1 percent but less than 20 percent impaired, limited or restricted Mobility: Walking and Moving Around Discharge Status [phone]): At least 1 percent but less than 20 percent impaired, limited or restricted    Time: 1205-1223 PT Time Calculation (min) (ACUTE ONLY): 18 min   Charges:   PT Evaluation $Initial PT Evaluation Tier I: 1 Procedure     PT G Codes:   PT G-Codes **NOT FOR INPATIENT CLASS** Functional Assessment Tool Used: clinical judgement Functional Limitation: Mobility: Walking and moving around Mobility: Walking and Moving Around Current Status (Y9244): At least 1 percent but less than 20 percent impaired, limited or restricted Mobility: Walking and Moving Around Discharge Status [phone]): At least 1 percent but less than 20 percent impaired, limited or restricted    Boleslaw Borghi, Tessie Fass 06/11/2015, 2:49 PM  06/11/2015  Donnella Sham, [place] [phone]  (pager)

## 2015-06-11 NOTE — Care Management Obs Status (Signed)
[facility] NOTIFICATION   Patient Details  Name: ARLYLE STRATE MRN: [id number] Date of Birth: 1930/03/19   Medicare Observation Status Notification Given:  Yes    Maryclare Labrador, RN 06/11/2015, 2:11 PM

## 2015-06-11 NOTE — Discharge Instructions (Signed)
Your symptoms were most likely related to chest wall pain. There were no signs that anything is wrong with your heart, no pneumonia or other concerning causes of chest pain.   Please follow up in clinic on January 5th with Dr. Randell Patient.  Chest Wall Pain Chest wall pain is pain in or around the bones and muscles of your chest. Sometimes, an injury causes this pain. Sometimes, the cause may not be known. This pain may take several weeks or longer to get better. HOME CARE INSTRUCTIONS  Pay attention to any changes in your symptoms. Take these actions to help with your pain:   Rest as told by your health care provider.   Avoid activities that cause pain. These include any activities that use your chest muscles or your abdominal and side muscles to lift heavy items.   If directed, apply ice to the painful area:  Put ice in a plastic bag.  Place a towel between your skin and the bag.  Leave the ice on for 20 minutes, 2-3 times per day.  Take over-the-counter and prescription medicines only as told by your health care provider.  Do not use tobacco products, including cigarettes, chewing tobacco, and e-cigarettes. If you need help quitting, ask your health care provider.  Keep all follow-up visits as told by your health care provider. This is important. SEEK MEDICAL CARE IF:  You have a fever.  Your chest pain becomes worse.  You have new symptoms. SEEK IMMEDIATE MEDICAL CARE IF:  You have nausea or vomiting.  You feel sweaty or light-headed.  You have a cough with phlegm (sputum) or you cough up blood.  You develop shortness of breath.   This information is not intended to replace advice given to you by your health care provider. Make sure you discuss any questions you have with your health care provider.   workup without any acute findings. Take pain medicine as directed. Make appoint with follow-up with her regular doctor. Return for any new or worse symptoms. Document  Released: 06/07/2005 Document Revised: 02/26/2015 Document Reviewed: 09/02/2014 Elsevier Interactive Patient Education [employer].

## 2015-06-11 NOTE — Evaluation (Signed)
Occupational Therapy Evaluation Patient Details Name: Kayla Brooks MRN: [id number] DOB: [birth year] Today's Date: 06/11/2015    History of Present Illness 81 year old woman with history of multiple myeloma was admitted for observation of chest pain.    Clinical Impression   Pt demonstrates impaired balance, per son she is performing at her baseline.  Pt is fiercely independent per son and refused to use medical alert system son had in the home. Educated pt in home safety and fall prevention and importance of using her cane particularly when outside her home.  Son plans to stay with pt this evening to observe her.      Follow Up Recommendations  Supervision - Intermittent (agree with HHPT for safety assessment)    Equipment Recommendations       Recommendations for Other Services       Precautions / Restrictions        Mobility Bed Mobility Overal bed mobility: Modified Independent Bed Mobility: Supine to Sit;Sit to Supine           Transfers Overall transfer level: Modified independent   Transfers: Sit to/from Stand Sit to Stand: Modified independent (Device/Increase time)              Balance Overall balance assessment: Needs assistance Sitting-balance support: No upper extremity supported Sitting balance-Leahy Scale: Good     Standing balance support: No upper extremity supported Standing balance-Leahy Scale: Fair                              ADL Overall ADL's : At baseline;Modified independent                                       General ADL Comments: Pt demonstrated toilet transfers and tub transfers.  Noted to furniture walk in her room.  Instructed pt to use cane at home and avoid carrying heavy objects, climbing on step stools. Recommended pt get grab bars for tub. Educated in home safety ie: good lighting, removal of throw rugs, wearing non slip shoes.     Vision Additional Comments: pt states she is able to  read small print   Perception     Praxis      Pertinent Vitals/Pain Pain Assessment: No/denies pain Faces Pain Scale: Hurts a little bit Pain Location: HA Pain Descriptors / Indicators: Aching Pain Intervention(s): RN gave pain meds during session     Hand Dominance Right   Extremity/Trunk Assessment Upper Extremity Assessment Upper Extremity Assessment: Overall WFL for tasks assessed   Lower Extremity Assessment Lower Extremity Assessment: Overall WFL for tasks assessed       Communication Communication Communication: No difficulties   Cognition Arousal/Alertness: Awake/alert Behavior During Therapy: WFL for tasks assessed/performed Overall Cognitive Status: Within Functional Limits for tasks assessed (pt minimizes fall risk)                     General Comments       Exercises       Shoulder Instructions      Home Living Family/patient expects to be discharged to:: Private residence Living Arrangements: Alone Available Help at Discharge: Friend(s);Family;Available PRN/intermittently Type of Home: Apartment Home Access: Level entry     Home Layout: One level     Bathroom Shower/Tub: Teacher, early years/pre: Standard     Home Equipment:  Walker - 2 wheels;Cane - single point          Prior Functioning/Environment Level of Independence: Independent;Independent with assistive device(s)        Comments: pt takes the bus to her brother in [place] home and assists him with meals, pt typically furniture walks, pt has refuses life alert button in home per son    OT Diagnosis: Generalized weakness;Acute pain   OT Problem List:     OT Treatment/Interventions:      OT Goals(Current goals can be found in the care plan section)    OT Frequency:     Barriers to D/C:            Co-evaluation              End of Session    Activity Tolerance: Patient tolerated treatment well Patient left: in bed;with family/visitor present    Time: 2081-3887 OT Time Calculation (min): 22 min Charges:  OT General Charges $OT Visit: 1 Procedure OT Evaluation $Initial OT Evaluation Tier I: 1 Procedure G-Codes: OT G-codes **NOT FOR INPATIENT CLASS** Functional Assessment Tool Used: clinical judgement Functional Limitation: Self care Self Care Current Status (J9597): At least 1 percent but less than 20 percent impaired, limited or restricted Self Care Goal Status (I7185): At least 1 percent but less than 20 percent impaired, limited or restricted Self Care Discharge Status [phone]): At least 1 percent but less than 20 percent impaired, limited or restricted  Malka So 06/11/2015, 3:05 PM  [phone]

## 2015-06-11 NOTE — Discharge Summary (Signed)
Name: Kayla Brooks MRN: [id number] DOB: October 12, 1929 81 y.o. PCP: Axel Filler, MD  Date of Admission: 06/10/2015 10:39 AM Date of Discharge: 06/11/2015 Attending Physician: Axel Filler, MD  Discharge Diagnosis: Active Problems:   Chest pain   Pain in the chest  Discharge Medications:   Medication List    TAKE these medications        HYDROcodone-acetaminophen 5-325 MG tablet  Commonly known as:  NORCO/VICODIN  Take 1 tablet by mouth every 6 (six) hours as needed for moderate pain.      ASK your doctor about these medications        amLODipine 5 MG tablet  Commonly known as:  NORVASC  Take 1 tablet (5 mg total) by mouth daily.     aspirin 81 MG tablet  Take 1 tablet (81 mg total) by mouth daily.     Calcium Carbonate-Vitamin D 600-400 MG-UNIT tablet  Commonly known as:  CALCIUM 600+D  Take 1 tablet by mouth daily.     docusate sodium 100 MG capsule  Commonly known as:  COLACE  Take 1 capsule (100 mg total) by mouth daily as needed.     lisinopril 40 MG tablet  Commonly known as:  PRINIVIL,ZESTRIL  Take 1 tablet (40 mg total) by mouth daily.     nitroGLYCERIN 0.4 MG SL tablet  Commonly known as:  NITROSTAT  Place 1 tablet (0.4 mg total) under the tongue every 5 (five) minutes as needed for chest pain.     omeprazole 20 MG capsule  Commonly known as:  PRILOSEC  Take 1 capsule (20 mg total) by mouth daily.     oxybutynin 5 MG tablet  Commonly known as:  DITROPAN  Take 1 tablet (5 mg total) by mouth 2 (two) times daily.     oxyCODONE 5 MG immediate release tablet  Commonly known as:  Oxy IR/ROXICODONE  Take 1 tablet (5 mg total) by mouth every 4 (four) hours as needed for severe pain.     simvastatin 20 MG tablet  Commonly known as:  ZOCOR  take 1 tablet by mouth at bedtime        Disposition and follow-up:   KaylaFelicity D Brooks was discharged from [facility] in Good condition.  At the hospital follow up visit  please address:  1. Chest pain felt to be MSK in nature. Improved with NSAIDs. Please assess for any recurrence of chest pain.  2.  Labs / imaging needed at time of follow-up: None  3.  Pending labs/ test needing follow-up: None  Follow-up Appointments:     Follow-up Information    Schedule an appointment as soon as possible for a visit with Axel Filler, MD.   Specialty:  Internal Medicine   Contact information:   [address] [place] [address] [place] [address] [phone]       Discharge Instructions:   Consultations:    Procedures Performed:  Dg Chest 2 View  06/10/2015  CLINICAL DATA:  Left-sided chest pain and shortness of breath for 3 days which markedly worsened this morning. Initial encounter. EXAM: CHEST  2 VIEW COMPARISON:  PA and lateral chest 07/20/2014.  CT chest 02/16/2010. FINDINGS: There is cardiomegaly without edema. The lungs are clear. No pneumothorax or pleural effusion. Aortic atherosclerosis is identified. No focal bony abnormality. IMPRESSION: No acute disease. Cardiomegaly. Atherosclerosis. Electronically Signed   By: Inge Rise M.D.   On: 06/10/2015 12:24   Dg Bone Survey  Met  05/30/2015  CLINICAL DATA:  History of multiple myeloma. EXAM: METASTATIC BONE SURVEY COMPARISON:  09/23/2014 FINDINGS: Edentulous with marked alveolar ridge atrophy. Usual for age degenerative changes throughout the spine, with mild scoliosis. A T3 superior endplate concavity is chronic and stable. No evidence of acute cardiopulmonary disease. Nipple shadow noted on the right. No acute finding in the abdomen.  Cholelithiasis.  Atherosclerosis. Well seated fixation hardware in the right femur. Hip osteoarthritis, asymmetrically advanced on the right with bone-on-bone contact. IMPRESSION: No myelomatous focus identified.  Stable study since 09/23/2014. Electronically Signed   By: Monte Fantasia M.D.   On: 05/30/2015 16:04     Admission HPI: Kayla Brooks is an 81 year old  female with a past medical history of multiple myeloma s/p treatment with Revlimid since 03/2012 followed by Dr. Alen Blew presenting with left sided chest pain. She reports that over the past 2-3 days she has had progressively worsening left sided pleuritic chest pain on the underside of her left breast. She reports the pain is a sharp-stabbing pain that is 10/10. It is worsened by coughing and taking a deep breath. Denies any trauma to the area. The pain does not radiate anywhere. She has not tried anything to relieve the pain. She denies any shortness of breath but is unable to take deep breaths due to pain. Denies any fevers, chills, cough, palpations, nausea, vomiting, abdominal pain. Reports chronic diarrhea.   Vital signs were stable in the ED with no tachycardia, tachypnea, satting well on room air. CXR done in the ED negative for PNA, pneumothroax, pulmonary edema or acute bony abnormalities. Initial troponin negative. EKG with no acute changes.   Hospital Course by problem list:   Atypical Chest Pain: Patient presented with worsening pleuritic left sided chest pain under her left breast over the past 2-3 days. Pain worsened with deep inspiration and worse with position change. Unaffected by exertion. CXR in the ED was negative for PNA, pneumothroax, pulmonary edema or acute bony abnormalities. Small troponin bump to 0.07 but negative on repeat. EKG showed no acute changes. No tachycardia, tachypnea, and she was satting well on room air. No reproducible chest pain. Lungs were clear bilaterally. No evidence of myelomatosis recurrence on recent skeletal survey on 12/9. CTA was ordered by the ED but was unable to be performed due to access problems. Wells score was 0. Her pain improved overnight after receiving Toradol and she reported no recurrence of her chest pain the following day. Believe her chest pain was MSK in nature given no evidence of ACS, low risk for PE and angina pectoris as well as her  response to NSAIDs. Discharged in stable condition with follow up in clinic.   Hx of Multiple Myeloma: Followed by Dr. Alen Blew outpatient. 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 S/p surgical fixation followed by external beam radiation. 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 She has been off Revlimid since 03/2012. Her kappa free light chain has been increasing slowly up to 258 on 04/04/2015. Her skeletal survey on 05/30/2015 did not reveal any bony lesions.   Chronic anemia: Hgb 10.3 and stable during admission. Baseline around 10-11.  HTN: BP stable during hospitalization. Takes amlodipine 5 mg at home.Continued home meds.  HLD: Continued Simvastatin 20 mg PO daily  Discharge Vitals:   BP 115/23 mmHg  Pulse 50  Temp(Src) 98.8 F (37.1 C) (Oral)  Resp 18  Ht '5\' 5"'$  (1.651 m)  Wt 108  lb 7.5 oz (49.2 kg)  BMI 18.05 kg/m2  SpO2 100%  Discharge Labs:  Results for orders placed or performed during the hospital encounter of 06/10/15 (from the past 24 hour(s))  I-Stat Troponin, ED (not at [facility])     Status: None   Collection Time: 06/10/15  4:05 PM  Result Value Ref Range   Troponin i, poc 0.07 0.00 - 0.08 ng/mL   Comment 3          Troponin I     Status: None   Collection Time: 06/10/15 10:24 PM  Result Value Ref Range   Troponin I <0.03 <0.031 ng/mL  Basic metabolic panel     Status: Abnormal   Collection Time: 06/11/15  4:43 AM  Result Value Ref Range   Sodium 140 135 - 145 mmol/L   Potassium 4.1 3.5 - 5.1 mmol/L   Chloride 110 101 - 111 mmol/L   CO2 24 22 - 32 mmol/L   Glucose, Bld 89 65 - 99 mg/dL   BUN 10 6 - 20 mg/dL   Creatinine, Ser 0.75 0.44 - 1.00 mg/dL   Calcium 8.7 (L) 8.9 - 10.3 mg/dL   GFR calc non Af Amer >60 >60 mL/min   GFR calc Af Amer >60 >60 mL/min   Anion gap 6 5 - 15  CBC     Status: Abnormal   Collection Time: 06/11/15  4:43 AM  Result Value Ref Range   WBC 3.7 (L) 4.0 - 10.5 K/uL    RBC 3.12 (L) 3.87 - 5.11 MIL/uL   Hemoglobin 10.0 (L) 12.0 - 15.0 g/dL   HCT 30.0 (L) 36.0 - 46.0 %   MCV 96.2 78.0 - 100.0 fL   MCH 32.1 26.0 - 34.0 pg   MCHC 33.3 30.0 - 36.0 g/dL   RDW 12.1 11.5 - 15.5 %   Platelets 149 (L) 150 - 400 K/uL    Signed: Maryellen Pile, MD 06/11/2015, 2:46 PM

## 2015-06-11 NOTE — Progress Notes (Signed)
Pt discharged home with family via [place] by NT.  No s/s of distress.  Pt stable at discharge

## 2015-06-11 NOTE — Progress Notes (Signed)
Assessed pt. For site for 18 gauge or 20 gauge iv site per request of MD for pt. To have test. Unable to obtain 2nd site. When talked with MD notified her there was no IV nurse here tonight to put PICC. Gerarda Gunther RN VAST.

## 2015-06-26 ENCOUNTER — Ambulatory Visit: Payer: Self-pay | Admitting: Pulmonary Disease

## 2015-07-02 ENCOUNTER — Ambulatory Visit: Payer: Self-pay | Admitting: Pulmonary Disease

## 2015-07-09 NOTE — Progress Notes (Addendum)
Late Entry Addendum to the Initial Evaluation    06/11/15 1400  PT Time Calculation  PT Start Time (ACUTE ONLY) 1205  PT Stop Time (ACUTE ONLY) 1223  PT Time Calculation (min) (ACUTE ONLY) 18 min  PT G-Codes **NOT FOR INPATIENT CLASS**  Functional Assessment Tool Used clinical judgement  Functional Limitation Mobility: Walking and moving around  Mobility: Walking and Moving Around Current Status [id number]) CI  Mobility: Walking and Moving Around Goal Status [id number]) CI  Mobility: Walking and Moving Around Discharge Status [phone]) CI  PT General Charges  $$ ACUTE PT VISIT 1 Procedure   07/09/2015  Donnella Sham, PT [phone]  (pager)

## 2015-08-05 ENCOUNTER — Other Ambulatory Visit: Payer: Self-pay | Admitting: *Deleted

## 2015-08-05 ENCOUNTER — Other Ambulatory Visit (HOSPITAL_BASED_OUTPATIENT_CLINIC_OR_DEPARTMENT_OTHER): Payer: Medicare Other

## 2015-08-05 ENCOUNTER — Ambulatory Visit (HOSPITAL_BASED_OUTPATIENT_CLINIC_OR_DEPARTMENT_OTHER): Payer: Medicare Other | Admitting: Oncology

## 2015-08-05 ENCOUNTER — Telehealth: Payer: Self-pay | Admitting: Oncology

## 2015-08-05 VITALS — BP 124/57 | HR 58 | Temp 98.0°F | Resp 16 | Ht 65.0 in | Wt 113.7 lb

## 2015-08-05 DIAGNOSIS — C9000 Multiple myeloma not having achieved remission: Secondary | ICD-10-CM

## 2015-08-05 DIAGNOSIS — C9002 Multiple myeloma in relapse: Secondary | ICD-10-CM

## 2015-08-05 LAB — COMPREHENSIVE METABOLIC PANEL
ALBUMIN: 3.4 g/dL — AB (ref 3.5–5.0)
ALK PHOS: 134 U/L (ref 40–150)
ALT: 13 U/L (ref 0–55)
AST: 17 U/L (ref 5–34)
Anion Gap: 9 mEq/L (ref 3–11)
BILIRUBIN TOTAL: 0.93 mg/dL (ref 0.20–1.20)
BUN: 15.5 mg/dL (ref 7.0–26.0)
CALCIUM: 8.7 mg/dL (ref 8.4–10.4)
CO2: 24 mEq/L (ref 22–29)
CREATININE: 0.9 mg/dL (ref 0.6–1.1)
Chloride: 109 mEq/L (ref 98–109)
EGFR: 68 mL/min/{1.73_m2} — ABNORMAL LOW (ref >90)
GLUCOSE: 87 mg/dL (ref 70–140)
Potassium: 3.6 mEq/L (ref 3.5–5.1)
SODIUM: 143 meq/L (ref 136–145)
TOTAL PROTEIN: 7.3 g/dL (ref 6.4–8.3)

## 2015-08-05 LAB — CBC WITH DIFFERENTIAL/PLATELET
BASO%: 0.3 % (ref 0.0–2.0)
BASOS ABS: 0 10*3/uL (ref 0.0–0.1)
EOS%: 1.5 % (ref 0.0–7.0)
Eosinophils Absolute: 0.1 10*3/uL (ref 0.0–0.5)
HEMATOCRIT: 32.9 % — AB (ref 34.8–46.6)
HEMOGLOBIN: 10.6 g/dL — AB (ref 11.6–15.9)
LYMPH#: 1 10*3/uL (ref 0.9–3.3)
LYMPH%: 27.4 % (ref 14.0–49.7)
MCH: 31.2 pg (ref 25.1–34.0)
MCHC: 32.3 g/dL (ref 31.5–36.0)
MCV: 96.5 fL (ref 79.5–101.0)
MONO#: 0.2 10*3/uL (ref 0.1–0.9)
MONO%: 5 % (ref 0.0–14.0)
NEUT%: 65.8 % (ref 38.4–76.8)
NEUTROS ABS: 2.4 10*3/uL (ref 1.5–6.5)
Platelets: 160 10*3/uL (ref 145–400)
RBC: 3.41 10*6/uL — ABNORMAL LOW (ref 3.70–5.45)
RDW: 12.8 % (ref 11.2–14.5)
WBC: 3.7 10*3/uL — AB (ref 3.9–10.3)

## 2015-08-05 MED ORDER — OXYCODONE HCL 5 MG PO TABS: 5.0000 mg | ORAL_TABLET | ORAL | Status: DC | PRN

## 2015-08-05 NOTE — Progress Notes (Signed)
Hematology and Oncology Follow Up Visit  [name] [id number] May 21, 1930 82 y.o. 08/05/2015 2:51 PM  CC: Kayla Ser, MD  Kayla Brooks. Kayla Blalock, MD, [facility]  Kayla Brooks, M.D., Ph.D.    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  She had a light chain multiple myeloma with M spike of about 0.35 gm/dL.  Prior Therapy: 1. Status post prophylactic fixation impending pathological fracture of the right femur done in August 2009. 2. External beam radiation concluded in 2010 at the right femur. 3. Revlimid 25 mg daily as maintenance therapy for 2 years ending 03/2012.  Current therapy:  Watchful observation.   Interim History:  Kayla Brooks presents today for a followup visit. Since her last visit, she continues to report improvement in her health. She is ambulating without the help of a cane or walker. She is using public transportation without any falls or syncope. She reports very little hip pain at this time and she does use occasionally oxycodone.    Her appetite remains adequate and have gained close to 2 pounds in the last 4 months. She reports her swallowing have been reasonable and does not report any recent pain or pathological fractures.  She does not report any headaches, blurry vision or syncope. She does not report any fevers or chills or sweats. She had not had any nausea, not had any abdominal distension. Has not reported any chest pain or shortness of breath. Is not reporting any change in her bowel habits. She does not report any urinary symptoms of frequency urgency or hesitancy. Remainder of her review of systems unremarkable.   Medications: I have reviewed the patient's current medications.  Current Outpatient Prescriptions  Medication Sig Dispense Refill  . amLODipine (NORVASC) 5 MG tablet Take 1 tablet (5 mg total) by mouth  daily. 90 tablet 3  . aspirin 81 MG tablet Take 1 tablet (81 mg total) by mouth daily. 31 tablet 11  . Calcium Carbonate-Vitamin D (CALCIUM 600+D) 600-400 MG-UNIT per tablet Take 1 tablet by mouth daily. 30 tablet 3  . docusate sodium (COLACE) 100 MG capsule Take 1 capsule (100 mg total) by mouth daily as needed. 30 capsule 1  . ibuprofen (ADVIL,MOTRIN) 600 MG tablet Take 1 tablet (600 mg total) by mouth every 6 (six) hours as needed for moderate pain. 30 tablet 0  . lisinopril (PRINIVIL,ZESTRIL) 40 MG tablet Take 1 tablet (40 mg total) by mouth daily. 90 tablet 3  . nitroGLYCERIN (NITROSTAT) 0.4 MG SL tablet Place 1 tablet (0.4 mg total) under the tongue every 5 (five) minutes as needed for chest pain. 90 tablet 5  . omeprazole (PRILOSEC) 20 MG capsule Take 1 capsule (20 mg total) by mouth daily. 30 capsule 2  . oxybutynin (DITROPAN) 5 MG tablet Take 1 tablet (5 mg total) by mouth 2 (two) times daily. 60 tablet 1  . simvastatin (ZOCOR) 20 MG tablet take 1 tablet by mouth at bedtime 30 tablet 5  . oxyCODONE (OXY IR/ROXICODONE) 5 MG immediate release tablet Take 1 tablet (5 mg total) by mouth every 4 (four) hours as needed for severe pain. 30 tablet 0   No current facility-administered medications for this visit.  , Allergies:  Allergies  Allergen Reactions  . Morphine And Related Other (See Comments)    Abdominal Pain    Past Medical History, Surgical history, Social history,  and Family History were reviewed and updated.   Physical Exam: Blood pressure 124/57, pulse 58, temperature 98 F (36.7 C), temperature source Oral, resp. rate 16, height '5\' 5"'  (1.651 m), weight 113 lb 11.2 oz (51.574 kg), SpO2 100 %. ECOG: 1 General appearance: alert, not in any distress. Head: Normocephalic, without obvious abnormality . No oral thrush noted. Neck: no adenopathy Lymph nodes: Cervical, supraclavicular, and axillary nodes normal. Heart:regular rate and rhythm, S1, S2 normal, no murmur, click, rub  or gallop Lung:chest clear, no wheezing, rales, normal symmetric air entry Abdomen: soft, non-tender, without masses or organomegaly no ascites noted. EXT:no evidence of edma.  Skin: No rash or desquamation.   Lab Results: Lab Results  Component Value Date   WBC 3.7* 08/05/2015   HGB 10.6* 08/05/2015   HCT 32.9* 08/05/2015   MCV 96.5 08/05/2015   PLT 160 08/05/2015     Impression and Plan:  A 82 year old female with the following issues. 1. History of a plasmacytoma of the right femur.  She is status post surgical fixation followed by external beam radiation. No major changes on recent x-rays. 2. 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  She has been off Revlimid since 03/2012. Her kappa free light chain has been increasing but she continues to be asymptomatic. Her laboratory data reviewed from today and showed no major changes. She is clinically feeling well and I would like to avoid any therapy unless necessary. Her skeletal survey in December 2016 showed no major abnormalities. The plan is continued active surveillance and repeat laboratory testing in 3 months and skeletal survey in 6 months. 3. Bony disease.  Continue to be on calcium and vitamin D supplements.  We will considering starting bisphosphonates and her disease had progressed. 4. Right hip pain: Seems to be controlled with oxycodone. We will refill that for her today and she had been using it infrequently. 5. Follow up in 12 weeks sooner if needed to.      Zola Button, MD 2/14/20172:51 PM

## 2015-08-05 NOTE — Telephone Encounter (Signed)
per pof to sch pt appt-gave pt copy of avs °

## 2015-08-06 LAB — KAPPA/LAMBDA LIGHT CHAINS
Ig Lambda Free Light Chain: 5.54 mg/L — ABNORMAL LOW (ref 5.71–26.30)
Kappa/Lambda FluidC Ratio: 332.67 — ABNORMAL HIGH (ref 0.26–1.65)

## 2015-08-08 LAB — MULTIPLE MYELOMA PANEL, SERUM
Albumin SerPl Elph-Mcnc: 3.2 g/dL (ref 2.9–4.4)
Albumin/Glob SerPl: 1 (ref 0.7–1.7)
Alpha 1: 0.2 g/dL (ref 0.0–0.4)
Alpha2 Glob SerPl Elph-Mcnc: 0.5 g/dL (ref 0.4–1.0)
B-GLOBULIN SERPL ELPH-MCNC: 2.2 g/dL — AB (ref 0.7–1.3)
Gamma Glob SerPl Elph-Mcnc: 0.3 g/dL — ABNORMAL LOW (ref 0.4–1.8)
Globulin, Total: 3.3 g/dL (ref 2.2–3.9)
IGA/IMMUNOGLOBULIN A, SERUM: 26 mg/dL — AB (ref 64–422)
IgG, Qn, Serum: 449 mg/dL — ABNORMAL LOW (ref 700–1600)
M PROTEIN SERPL ELPH-MCNC: 1.4 g/dL — AB
TOTAL PROTEIN: 6.5 g/dL (ref 6.0–8.5)

## 2015-11-05 ENCOUNTER — Ambulatory Visit (HOSPITAL_BASED_OUTPATIENT_CLINIC_OR_DEPARTMENT_OTHER): Payer: Medicare Other | Admitting: Oncology

## 2015-11-05 ENCOUNTER — Other Ambulatory Visit (HOSPITAL_BASED_OUTPATIENT_CLINIC_OR_DEPARTMENT_OTHER): Payer: Medicare Other

## 2015-11-05 ENCOUNTER — Telehealth: Payer: Self-pay | Admitting: Oncology

## 2015-11-05 VITALS — BP 132/60 | HR 56 | Temp 98.2°F | Resp 18 | Ht 65.0 in | Wt 112.5 lb

## 2015-11-05 DIAGNOSIS — C9000 Multiple myeloma not having achieved remission: Secondary | ICD-10-CM | POA: Diagnosis not present

## 2015-11-05 LAB — COMPREHENSIVE METABOLIC PANEL
ALBUMIN: 3.4 g/dL — AB (ref 3.5–5.0)
ALK PHOS: 137 U/L (ref 40–150)
ALT: 18 U/L (ref 0–55)
ANION GAP: 8 meq/L (ref 3–11)
AST: 20 U/L (ref 5–34)
BILIRUBIN TOTAL: 0.77 mg/dL (ref 0.20–1.20)
BUN: 20.4 mg/dL (ref 7.0–26.0)
CO2: 26 mEq/L (ref 22–29)
CREATININE: 1.1 mg/dL (ref 0.6–1.1)
Calcium: 9.3 mg/dL (ref 8.4–10.4)
Chloride: 109 mEq/L (ref 98–109)
EGFR: 53 mL/min/{1.73_m2} — AB (ref >90)
Glucose: 86 mg/dl (ref 70–140)
Potassium: 4.3 mEq/L (ref 3.5–5.1)
Sodium: 142 mEq/L (ref 136–145)
TOTAL PROTEIN: 7.6 g/dL (ref 6.4–8.3)

## 2015-11-05 LAB — CBC WITH DIFFERENTIAL/PLATELET
BASO%: 0.2 % (ref 0.0–2.0)
Basophils Absolute: 0 10*3/uL (ref 0.0–0.1)
EOS ABS: 0.1 10*3/uL (ref 0.0–0.5)
EOS%: 2.4 % (ref 0.0–7.0)
HCT: 33.4 % — ABNORMAL LOW (ref 34.8–46.6)
HEMOGLOBIN: 10.9 g/dL — AB (ref 11.6–15.9)
LYMPH%: 37.7 % (ref 14.0–49.7)
MCH: 31.6 pg (ref 25.1–34.0)
MCHC: 32.5 g/dL (ref 31.5–36.0)
MCV: 97.3 fL (ref 79.5–101.0)
MONO#: 0.2 10*3/uL (ref 0.1–0.9)
MONO%: 5.9 % (ref 0.0–14.0)
NEUT%: 53.8 % (ref 38.4–76.8)
NEUTROS ABS: 2.2 10*3/uL (ref 1.5–6.5)
Platelets: 153 10*3/uL (ref 145–400)
RBC: 3.43 10*6/uL — AB (ref 3.70–5.45)
RDW: 13 % (ref 11.2–14.5)
WBC: 4 10*3/uL (ref 3.9–10.3)
lymph#: 1.5 10*3/uL (ref 0.9–3.3)

## 2015-11-05 NOTE — Telephone Encounter (Signed)
Gave pt apt & avs °

## 2015-11-05 NOTE — Progress Notes (Signed)
Hematology and Oncology Follow Up Visit  Kayla Brooks [id number] 1929/12/18 82 y.o. 11/05/2015 2:29 PM  CC: Kayla Ser, MD  [name], MD, [facility]  [name], M.D., Ph.D.    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  She had a light chain multiple myeloma with M spike of about 0.35 gm/dL.  Prior Therapy: 1. Status post prophylactic fixation impending pathological fracture of the right femur done in August 2009. 2. External beam radiation concluded in 2010 at the right femur. 3. Revlimid 25 mg daily as maintenance therapy for 2 years ending 03/2012.  Current therapy:  Watchful observation.   Interim History:  Kayla Brooks presents today for a followup visit. Since her last visit, she reports no major changes or decline in her health. She continues to live independently and attends to activities of daily living. She continues to use public transportation to make her appointment. She rarely reports any bone pain at this time including hip pain. She have been prescribed a medication but rarely uses it. She is ambulating without the help of a cane. Her appetite remains about the same and weight are relatively stable.  She does not report any headaches, blurry vision or syncope. She does not report any fevers or chills or sweats. She had not had any nausea, not had any abdominal distension. Has not reported any chest pain or shortness of breath. Is not reporting any change in her bowel habits. She does not report any urinary symptoms of frequency urgency or hesitancy. Remainder of her review of systems unremarkable.   Medications: I have reviewed the patient's current medications.  Current Outpatient Prescriptions  Medication Sig Dispense Refill  . amLODipine (NORVASC) 5 MG tablet Take 1 tablet (5 mg total) by mouth daily. 90 tablet 3  .  aspirin 81 MG tablet Take 1 tablet (81 mg total) by mouth daily. 31 tablet 11  . Calcium Carbonate-Vitamin D (CALCIUM 600+D) 600-400 MG-UNIT per tablet Take 1 tablet by mouth daily. 30 tablet 3  . docusate sodium (COLACE) 100 MG capsule Take 1 capsule (100 mg total) by mouth daily as needed. 30 capsule 1  . ibuprofen (ADVIL,MOTRIN) 600 MG tablet Take 1 tablet (600 mg total) by mouth every 6 (six) hours as needed for moderate pain. 30 tablet 0  . lisinopril (PRINIVIL,ZESTRIL) 40 MG tablet Take 1 tablet (40 mg total) by mouth daily. 90 tablet 3  . nitroGLYCERIN (NITROSTAT) 0.4 MG SL tablet Place 1 tablet (0.4 mg total) under the tongue every 5 (five) minutes as needed for chest pain. 90 tablet 5  . omeprazole (PRILOSEC) 20 MG capsule Take 1 capsule (20 mg total) by mouth daily. 30 capsule 2  . oxybutynin (DITROPAN) 5 MG tablet Take 1 tablet (5 mg total) by mouth 2 (two) times daily. 60 tablet 1  . oxyCODONE (OXY IR/ROXICODONE) 5 MG immediate release tablet Take 1 tablet (5 mg total) by mouth every 4 (four) hours as needed for severe pain. 30 tablet 0  . simvastatin (ZOCOR) 20 MG tablet take 1 tablet by mouth at bedtime 30 tablet 5   No current facility-administered medications for this visit.  , Allergies:  Allergies  Allergen Reactions  . Morphine And Related Other (See Comments)    Abdominal Pain    Past Medical History, Surgical history, Social history, and Family History were reviewed and  updated.   Physical Exam: Blood pressure 132/60, pulse 56, temperature 98.2 F (36.8 C), temperature source Oral, resp. rate 18, height '5\' 5"'  (1.651 m), weight 112 lb 8 oz (51.03 kg), SpO2 100 %. ECOG: 1 General appearance: Pleasant woman appeared comfortable without distress. Head: Normocephalic, without obvious abnormality. No oral ulcers or lesions. Neck: no adenopathy Lymph nodes: Cervical, supraclavicular, and axillary nodes normal. Heart:regular rate and rhythm, S1, S2 normal, no murmur,  click, rub or gallop Lung:chest clear, no wheezing, rales, normal symmetric air entry Abdomen: soft, non-tender, without masses or organomegaly no shifting dullness or ascites. EXT:no evidence of edma.  Skin: No rash or desquamation.   Lab Results: Lab Results  Component Value Date   WBC 4.0 11/05/2015   HGB 10.9* 11/05/2015   HCT 33.4* 11/05/2015   MCV 97.3 11/05/2015   PLT 153 11/05/2015     Impression and Plan:  A 82 year old female with the following issues. 1. History of a plasmacytoma of the right femur.  She is status post surgical fixation followed by external beam radiation. No major changes on recent x-rays. 2. 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  She has been off Revlimid since 03/2012. Her kappa free light chain has been increasing without any clear-cut end organ damage. The plan is to continue with active surveillance and institute salvage therapy if she becomes symptomatic. She will have repeat protein studies as well as skeletal survey in July 2017. 3. Bony disease.  Continue to be on calcium and vitamin D supplements.  We will considering starting bisphosphonates and her disease had progressed. 4. Right hip pain: This will probably at this time. 5. Anemia: Continues to be close to her baseline without any major changes. Her anemia is likely related to chronic disease and less likely to related to plasma cell disorder. 6. Follow up in 2 months to discuss restaging workup.     [facility], MD 5/17/20172:29 PM

## 2015-11-07 ENCOUNTER — Other Ambulatory Visit: Payer: Self-pay | Admitting: *Deleted

## 2015-11-07 DIAGNOSIS — I10 Essential (primary) hypertension: Secondary | ICD-10-CM

## 2015-11-07 LAB — KAPPA/LAMBDA LIGHT CHAINS
IG LAMBDA FREE LIGHT CHAIN: 5.71 mg/L (ref 5.71–26.30)
KAPPA/LAMBDA FLC RATIO: 387.73 — AB (ref 0.26–1.65)

## 2015-11-07 MED ORDER — LISINOPRIL 40 MG PO TABS: 40.0000 mg | ORAL_TABLET | Freq: Every day | ORAL | Status: DC

## 2015-11-10 ENCOUNTER — Encounter: Payer: Self-pay | Admitting: Student in an Organized Health Care Education/Training Program

## 2015-11-10 ENCOUNTER — Ambulatory Visit (INDEPENDENT_AMBULATORY_CARE_PROVIDER_SITE_OTHER): Payer: Medicare Other | Admitting: Student in an Organized Health Care Education/Training Program

## 2015-11-10 VITALS — BP 146/54 | HR 52 | Temp 97.7°F | Ht 65.0 in | Wt 114.0 lb

## 2015-11-10 DIAGNOSIS — R351 Nocturia: Secondary | ICD-10-CM | POA: Diagnosis not present

## 2015-11-10 DIAGNOSIS — I10 Essential (primary) hypertension: Secondary | ICD-10-CM

## 2015-11-10 DIAGNOSIS — K219 Gastro-esophageal reflux disease without esophagitis: Secondary | ICD-10-CM

## 2015-11-10 DIAGNOSIS — N3941 Urge incontinence: Secondary | ICD-10-CM

## 2015-11-10 NOTE — Assessment & Plan Note (Signed)
Esophageal reflux symptoms are well controlled with omeprazole 20 mg daily. She says that she occasionally misses doses and then reliably has a sensation of reflux and pain as a result. We decided to continue low dose daily omeprazole.

## 2015-11-10 NOTE — Assessment & Plan Note (Signed)
She will history of mild urge incontinence, reports good benefit of oxybutynin. Now she just complains of about 2 episodes of nocturia nightly. Denies any dysuria or lower urinary tract symptoms. We talked about behavior modification including avoiding oral fluids by 7 PM every night. She is going to continue oxybutynin but this is potentially something we could stop the future if she does well with just behavior modification.

## 2015-11-10 NOTE — Assessment & Plan Note (Signed)
Blood pressure is elevated today because she has been out of lisinopril for about 3 days. Blood pressure readings at her oncology office of all but appropriate when she does take her medications. We refilled amlodipine and lisinopril which she will continue. I told her to call me if she hasblood pressures elevated above 130 and we can increase the amlodipine dose. BMP earlier this month was appropriate.

## 2015-11-10 NOTE — Progress Notes (Signed)
   See Encounters tab for problem-based medical decision making  __________________________________________________________  HPI:  82 year old woman here for follow-up of hypertension. It's been about a year since we last saw her for a general medicine exam. She has multiple myeloma which is being monitored by the oncology clinic. She has no acute complaints today. She reports having okay compliance with her antihypertensive medications She lives on her own in an apartment building, has family nearby. She manages her own medications. Doesn't use a pillbox, but says she has a good memory. She ran out of her medications late last week and has not been able to refill them yet, so currently off of antihypertensives. He had one admission last December for chest pain rule out, she was low risk for ACS and has done well since discharge. Denies any exertional chest pain or dyspnea. She is doing well taking care of her all of her activities of daily living, she doesn't drive but takes the bus, does all her own grocery shopping and finances. Urinary incontinence has been much improved with oxybutynin, now only complains of 2 episodes of nocturia nightly. Reflux is much improved with omeprazole. Denies any recent fevers or chills. No unintentional weight loss. No recent falls and patient does not feel unsteady.  __________________________________________________________  Problem List: Patient Active Problem List   Diagnosis Date Noted  . Healthcare maintenance 01/02/2015  . Urge incontinence 12/11/2014  . Tinnitus 10/02/2012  . Multiple myeloma ([place]) 03/27/2009  . HIP PAIN, RIGHT 03/27/2009  . DYSLIPIDEMIA 05/29/2008  . Essential hypertension 05/04/2006  . GERD 05/04/2006    Medications: Reconciled today in Epic __________________________________________________________  Physical Exam:  Vital Signs: Filed Vitals:   11/10/15 1124  BP: 146/54  Pulse: 52  Temp: 97.7 F (36.5 C)  TempSrc: Oral    Height: '5\' 5"'$  (1.651 m)  Weight: 114 lb (51.71 kg)  SpO2: 100%    Gen: Thin woman, appears well CV: RRR, no murmurs Pulm: Normal effort, CTA throughout, no wheezing Abd: Soft, thin abdomen, NT, ND.  Ext: Warm, no edema, normal joints Skin: No atypical appearing moles. No rashes

## 2015-11-11 LAB — MULTIPLE MYELOMA PANEL, SERUM
ALBUMIN SERPL ELPH-MCNC: 3.3 g/dL (ref 2.9–4.4)
Albumin/Glob SerPl: 0.9 (ref 0.7–1.7)
Alpha 1: 0.2 g/dL (ref 0.0–0.4)
Alpha2 Glob SerPl Elph-Mcnc: 0.6 g/dL (ref 0.4–1.0)
B-Globulin SerPl Elph-Mcnc: 2.5 g/dL — ABNORMAL HIGH (ref 0.7–1.3)
Gamma Glob SerPl Elph-Mcnc: 0.4 g/dL (ref 0.4–1.8)
Globulin, Total: 3.7 g/dL (ref 2.2–3.9)
IGA/IMMUNOGLOBULIN A, SERUM: 24 mg/dL — AB (ref 64–422)
IgG, Qn, Serum: 429 mg/dL — ABNORMAL LOW (ref 700–1600)
M PROTEIN SERPL ELPH-MCNC: 1.7 g/dL — AB
TOTAL PROTEIN: 7 g/dL (ref 6.0–8.5)

## 2015-11-14 ENCOUNTER — Ambulatory Visit: Payer: Self-pay | Admitting: Internal Medicine

## 2015-12-04 ENCOUNTER — Other Ambulatory Visit: Payer: Self-pay | Admitting: Internal Medicine

## 2015-12-31 ENCOUNTER — Ambulatory Visit (HOSPITAL_COMMUNITY): Payer: Medicare Other

## 2015-12-31 ENCOUNTER — Other Ambulatory Visit (HOSPITAL_BASED_OUTPATIENT_CLINIC_OR_DEPARTMENT_OTHER): Payer: Medicare Other

## 2015-12-31 DIAGNOSIS — C9000 Multiple myeloma not having achieved remission: Secondary | ICD-10-CM | POA: Diagnosis not present

## 2015-12-31 LAB — CBC WITH DIFFERENTIAL/PLATELET
BASO%: 0.5 % (ref 0.0–2.0)
Basophils Absolute: 0 10*3/uL (ref 0.0–0.1)
EOS ABS: 0.1 10*3/uL (ref 0.0–0.5)
EOS%: 1.8 % (ref 0.0–7.0)
HEMATOCRIT: 30.1 % — AB (ref 34.8–46.6)
HGB: 9.9 g/dL — ABNORMAL LOW (ref 11.6–15.9)
LYMPH#: 1.1 10*3/uL (ref 0.9–3.3)
LYMPH%: 18.8 % (ref 14.0–49.7)
MCH: 31.9 pg (ref 25.1–34.0)
MCHC: 33 g/dL (ref 31.5–36.0)
MCV: 96.8 fL (ref 79.5–101.0)
MONO#: 0.3 10*3/uL (ref 0.1–0.9)
MONO%: 4.7 % (ref 0.0–14.0)
NEUT#: 4.3 10*3/uL (ref 1.5–6.5)
NEUT%: 74.2 % (ref 38.4–76.8)
PLATELETS: 175 10*3/uL (ref 145–400)
RBC: 3.11 10*6/uL — AB (ref 3.70–5.45)
RDW: 12.9 % (ref 11.2–14.5)
WBC: 5.8 10*3/uL (ref 3.9–10.3)

## 2015-12-31 LAB — COMPREHENSIVE METABOLIC PANEL
ALBUMIN: 3.3 g/dL — AB (ref 3.5–5.0)
ALK PHOS: 135 U/L (ref 40–150)
ALT: 10 U/L (ref 0–55)
AST: 20 U/L (ref 5–34)
Anion Gap: 11 mEq/L (ref 3–11)
BUN: 16.2 mg/dL (ref 7.0–26.0)
CALCIUM: 9.1 mg/dL (ref 8.4–10.4)
CO2: 21 mEq/L — ABNORMAL LOW (ref 22–29)
Chloride: 109 mEq/L (ref 98–109)
Creatinine: 1 mg/dL (ref 0.6–1.1)
EGFR: 64 mL/min/{1.73_m2} — ABNORMAL LOW (ref >90)
Glucose: 100 mg/dl (ref 70–140)
POTASSIUM: 4.5 meq/L (ref 3.5–5.1)
Sodium: 141 mEq/L (ref 136–145)
Total Bilirubin: 0.77 mg/dL (ref 0.20–1.20)
Total Protein: 8 g/dL (ref 6.4–8.3)

## 2016-01-01 LAB — KAPPA/LAMBDA LIGHT CHAINS
Ig Lambda Free Light Chain: 6.1 mg/L (ref 5.7–26.3)
Kappa/Lambda FluidC Ratio: 499.28 — ABNORMAL HIGH (ref 0.26–1.65)

## 2016-01-06 LAB — MULTIPLE MYELOMA PANEL, SERUM
ALBUMIN SERPL ELPH-MCNC: 3.3 g/dL (ref 2.9–4.4)
ALPHA 1: 0.3 g/dL (ref 0.0–0.4)
Albumin/Glob SerPl: 0.9 (ref 0.7–1.7)
Alpha2 Glob SerPl Elph-Mcnc: 0.7 g/dL (ref 0.4–1.0)
B-Globulin SerPl Elph-Mcnc: 2.8 g/dL — ABNORMAL HIGH (ref 0.7–1.3)
Gamma Glob SerPl Elph-Mcnc: 0.3 g/dL — ABNORMAL LOW (ref 0.4–1.8)
Globulin, Total: 4.1 g/dL — ABNORMAL HIGH (ref 2.2–3.9)
IGA/IMMUNOGLOBULIN A, SERUM: 22 mg/dL — AB (ref 64–422)
IgG, Qn, Serum: 403 mg/dL — ABNORMAL LOW (ref 700–1600)
M Protein SerPl Elph-Mcnc: 1.9 g/dL — ABNORMAL HIGH
Total Protein: 7.4 g/dL (ref 6.0–8.5)

## 2016-01-07 ENCOUNTER — Other Ambulatory Visit: Payer: Self-pay | Admitting: *Deleted

## 2016-01-07 ENCOUNTER — Encounter: Payer: Self-pay | Admitting: Oncology

## 2016-01-07 ENCOUNTER — Ambulatory Visit (HOSPITAL_COMMUNITY)
Admission: RE | Admit: 2016-01-07 | Discharge: 2016-01-07 | Disposition: A | Discharge status: Home / Self Care | Payer: Medicare Other | Source: Ambulatory Visit | Attending: Oncology | Admitting: Oncology

## 2016-01-07 ENCOUNTER — Telehealth: Payer: Self-pay | Admitting: Oncology

## 2016-01-07 ENCOUNTER — Ambulatory Visit (HOSPITAL_BASED_OUTPATIENT_CLINIC_OR_DEPARTMENT_OTHER): Payer: Medicare Other | Admitting: Oncology

## 2016-01-07 VITALS — BP 142/60 | HR 62 | Temp 98.0°F | Resp 18 | Ht 65.0 in | Wt 114.8 lb

## 2016-01-07 DIAGNOSIS — I700 Atherosclerosis of aorta: Secondary | ICD-10-CM | POA: Insufficient documentation

## 2016-01-07 DIAGNOSIS — D649 Anemia, unspecified: Secondary | ICD-10-CM | POA: Diagnosis not present

## 2016-01-07 DIAGNOSIS — C9000 Multiple myeloma not having achieved remission: Secondary | ICD-10-CM | POA: Insufficient documentation

## 2016-01-07 DIAGNOSIS — I517 Cardiomegaly: Secondary | ICD-10-CM | POA: Diagnosis not present

## 2016-01-07 DIAGNOSIS — M8589 Other specified disorders of bone density and structure, multiple sites: Secondary | ICD-10-CM | POA: Diagnosis not present

## 2016-01-07 MED ORDER — LENALIDOMIDE 25 MG PO CAPS: 25.0000 mg | ORAL_CAPSULE | Freq: Every day | ORAL | Status: DC

## 2016-01-07 NOTE — Telephone Encounter (Signed)
Gave pt cal & avs °

## 2016-01-07 NOTE — Telephone Encounter (Signed)
Script for revlimid faxed to [facility]

## 2016-01-07 NOTE — Progress Notes (Signed)
Hematology and Oncology Follow Up Visit  Kayla Brooks [id number] 11/07/1929 82 y.o. 01/07/2016 4:31 PM  CC: Kayla Walsh, MD  Kayla C. Wall, MD, FACC  Kayla Brooks, M.D., Ph.D.    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  She had a light chain multiple myeloma with M spike of about 0.35 gm/dL.  Prior Therapy: 1. Status post prophylactic fixation impending pathological fracture of the right femur done in August 2009. 2. External beam radiation concluded in 2010 at the right femur. 3. Revlimid 25 mg daily as maintenance therapy for 2 years ending 03/2012.  Current therapy:  Under consideration to start systemic therapy.  Interim History:  [name] presents today for a followup visit. Since her last visit, she reports feeling reasonably well. She denied any changes in her health including falls or syncope. She continues to live independently and attends to activities of daily living. She continues to use public transportation to make her appointment. She is ambulating without the help of a cane. Her appetite remains about the same and weight are relatively stable. She reports that she is not gaining weight despite eating reasonably about the same.  She does not report any headaches, blurry vision or syncope. She does not report any fevers or chills or sweats. She had not had any nausea, not had any abdominal distension. Has not reported any chest pain or shortness of breath. Is not reporting any change in her bowel habits. She does not report any urinary symptoms of frequency urgency or hesitancy. Remainder of her review of systems unremarkable.   Medications: I have reviewed the patient's current medications.  Current Outpatient Prescriptions  Medication Sig Dispense Refill  . amLODipine (NORVASC) 5 MG tablet Take 1 tablet (5 mg total)  by mouth daily. 90 tablet 3  . aspirin 81 MG tablet Take 1 tablet (81 mg total) by mouth daily. 31 tablet 11  . Calcium Carbonate-Vitamin D (CALCIUM 600+D) 600-400 MG-UNIT per tablet Take 1 tablet by mouth daily. 30 tablet 3  . ibuprofen (ADVIL,MOTRIN) 600 MG tablet Take 1 tablet (600 mg total) by mouth every 6 (six) hours as needed for moderate pain. 30 tablet 0  . lisinopril (PRINIVIL,ZESTRIL) 40 MG tablet Take 1 tablet (40 mg total) by mouth daily. 90 tablet 3  . omeprazole (PRILOSEC) 20 MG capsule take 1 capsule by mouth once daily 30 capsule 2  . oxybutynin (DITROPAN) 5 MG tablet Take 1 tablet (5 mg total) by mouth 2 (two) times daily. 60 tablet 1  . simvastatin (ZOCOR) 20 MG tablet take 1 tablet by mouth at bedtime 30 tablet 5  . lenalidomide (REVLIMID) 25 MG capsule Take 1 capsule (25 mg total) by mouth daily. 21 capsule 0   No current facility-administered medications for this visit.  , Allergies:  Allergies  Allergen Reactions  . Morphine And Related Other (See Comments)    Abdominal Pain    Past Medical History, Surgical history, Social history, and Family History were reviewed and updated.   Physical Exam: Blood pressure 142/60, pulse 62, temperature 98 F (36.7 C), temperature source Oral, resp. rate 18, height 5' 5" (1.651 m), weight 114 lb 12.8 oz (52.073 kg), SpO2 100 %. ECOG: 1 General appearance: Alert, awake woman without distress. Head: Normocephalic, without obvious abnormality. No oral thrush noted. Neck: no adenopathy Lymph nodes: Cervical, supraclavicular, and axillary   nodes normal. Heart:regular rate and rhythm, S1, S2 normal, no murmur, click, rub or gallop Lung:chest clear, no wheezing, rales, normal symmetric air entry Abdomen: soft, non-tender, without masses or organomegaly no rebound or guarding. EXT:no evidence of edma.  Skin: No rash or desquamation.   Lab Results: Lab Results  Component Value Date   WBC 5.8 12/31/2015   HGB 9.9* 12/31/2015    HCT 30.1* 12/31/2015   MCV 96.8 12/31/2015   PLT 175 12/31/2015   Results for Kayla, Brooks (MRN [id number]) as of 01/07/2016 14:49  Ref. Range 12/31/2015 16:18  Ig Kappa Free Light Chain Latest Ref Range: 3.3-19.4 mg/L 3,045.6 (H)  Ig Lambda Free Light Chain Latest Ref Range: 5.7-26.3 mg/L 6.1  Kappa/Lambda FluidC Ratio Latest Ref Range: 0.26-1.65  499.28 (H)    Impression and Plan:  A 82 year old female with the following issues. 1. History of a plasmacytoma of the right femur.  She is status post surgical fixation followed by external beam radiation. No major changes on recent x-rays. 2. 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  She has been off Revlimid since 03/2012. Her kappa free light chain continues to increase rapidly and her skeletal survey has not been completed at this point. She is at risk of developing progression of disease and symptomatology. Risks and benefits of restarting Revlimid was reviewed today and she is agreeable to proceed. Complications from this medication include nausea, fatigue, cytopenia and diarrhea. Time and recheck her counts in 4 weeks. 3. Bony disease.  Continue to be on calcium and vitamin D supplements.  We will considering starting bisphosphonates and her disease had progressed. 4. Right hip pain: No issues at this time. She continued to follow with Dr. Redmond Pulling regarding this issue. 5. Anemia: Could be related due to progression of disease. She does not require any growth factor support or transfusion at this time. 6. Follow up in 2 months to discuss restaging workup.     Zola Button, MD 7/19/20174:31 PM

## 2016-01-07 NOTE — Progress Notes (Signed)
Hematology and Oncology Follow Up Visit  Kayla Brooks [id number] 1929-08-15 82 y.o. 01/07/2016 4:15 PM  CC: Kayla Ser, MD  Kayla Brooks. Kayla Blalock, MD, [facility]  Kayla Brooks, M.D., Ph.D.    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  She had a light chain multiple myeloma with M spike of about 0.35 gm/dL.  Prior Therapy: 1. Status post prophylactic fixation impending pathological fracture of the right femur done in August 2009. 2. External beam radiation concluded in 2010 at the right femur. 3. Revlimid 25 mg daily as maintenance therapy for 2 years ending 03/2012.  Current therapy:  Under consideration to start systemic therapy.  Interim History:  [name] presents today for a followup visit. Since her last visit, she reports feeling reasonably well. She denied any changes in her health including falls or syncope. She continues to live independently and attends to activities of daily living. She continues to use public transportation to make her appointment. She is ambulating without the help of a cane. Her appetite remains about the same and weight are relatively stable. She reports that she is not gaining weight despite eating reasonably about the same.  She does not report any headaches, blurry vision or syncope. She does not report any fevers or chills or sweats. She had not had any nausea, not had any abdominal distension. Has not reported any chest pain or shortness of breath. Is not reporting any change in her bowel habits. She does not report any urinary symptoms of frequency urgency or hesitancy. Remainder of her review of systems unremarkable.   Medications: I have reviewed the patient's current medications.  Current Outpatient Prescriptions  Medication Sig Dispense Refill  . amLODipine (NORVASC) 5 MG tablet Take 1 tablet (5 mg total)  by mouth daily. 90 tablet 3  . aspirin 81 MG tablet Take 1 tablet (81 mg total) by mouth daily. 31 tablet 11  . Calcium Carbonate-Vitamin D (CALCIUM 600+D) 600-400 MG-UNIT per tablet Take 1 tablet by mouth daily. 30 tablet 3  . ibuprofen (ADVIL,MOTRIN) 600 MG tablet Take 1 tablet (600 mg total) by mouth every 6 (six) hours as needed for moderate pain. 30 tablet 0  . lisinopril (PRINIVIL,ZESTRIL) 40 MG tablet Take 1 tablet (40 mg total) by mouth daily. 90 tablet 3  . omeprazole (PRILOSEC) 20 MG capsule take 1 capsule by mouth once daily 30 capsule 2  . oxybutynin (DITROPAN) 5 MG tablet Take 1 tablet (5 mg total) by mouth 2 (two) times daily. 60 tablet 1  . simvastatin (ZOCOR) 20 MG tablet take 1 tablet by mouth at bedtime 30 tablet 5  . lenalidomide (REVLIMID) 25 MG capsule Take 1 capsule (25 mg total) by mouth daily. 21 capsule 0   No current facility-administered medications for this visit.  , Allergies:  Allergies  Allergen Reactions  . Morphine And Related Other (See Comments)    Abdominal Pain    Past Medical History, Surgical history, Social history, and Family History were reviewed and updated.   Physical Exam: Blood pressure 142/60, pulse 62, temperature 98 F (36.7 C), temperature source Oral, resp. rate 18, height '5\' 5"'  (1.651 m), weight 114 lb 12.8 oz (52.073 kg), SpO2 100 %. ECOG: 1 General appearance: Alert, awake woman without distress. Head: Normocephalic, without obvious abnormality. No oral thrush noted. Neck: no adenopathy Lymph nodes: Cervical, supraclavicular, and axillary  nodes normal. Heart:regular rate and rhythm, S1, S2 normal, no murmur, click, rub or gallop Lung:chest clear, no wheezing, rales, normal symmetric air entry Abdomen: soft, non-tender, without masses or organomegaly no rebound or guarding. EXT:no evidence of edma.  Skin: No rash or desquamation.   Lab Results: Lab Results  Component Value Date   WBC 5.8 12/31/2015   HGB 9.9* 12/31/2015    HCT 30.1* 12/31/2015   MCV 96.8 12/31/2015   PLT 175 12/31/2015   Results for Kayla, Brooks (MRN [id number]) as of 01/07/2016 14:49  Ref. Range 12/31/2015 16:18  Ig Kappa Free Light Chain Latest Ref Range: 3.3-19.4 mg/L 3,045.6 (H)  Ig Lambda Free Light Chain Latest Ref Range: 5.7-26.3 mg/L 6.1  Kappa/Lambda FluidC Ratio Latest Ref Range: 0.26-1.65  499.28 (H)    Impression and Plan:  A 82 year old female with the following issues. 1. History of a plasmacytoma of the right femur.  She is status post surgical fixation followed by external beam radiation. No major changes on recent x-rays. 2. 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  She has been off Revlimid since 03/2012. Her kappa free light chain continues to increase rapidly and her skeletal survey 3. Bony disease.  Continue to be on calcium and vitamin D supplements.  We will considering starting bisphosphonates and her disease had progressed. 4. Right hip pain: This will probably at this time. 5. Anemia: Continues to be close to her baseline without any major changes. Her anemia is likely related to chronic disease and less likely to related to plasma cell disorder. 6. Follow up in 2 months to discuss restaging workup.     [facility], MD 7/19/20174:15 PM

## 2016-01-08 ENCOUNTER — Encounter: Payer: Self-pay | Admitting: *Deleted

## 2016-01-12 ENCOUNTER — Encounter: Payer: Self-pay | Admitting: *Deleted

## 2016-01-13 ENCOUNTER — Encounter: Payer: Self-pay | Admitting: Oncology

## 2016-01-13 NOTE — Progress Notes (Signed)
Recd note from [employer]- revlimid was approved 01/08/16-under further notice unders his medicare part d [phone]

## 2016-02-04 ENCOUNTER — Ambulatory Visit (HOSPITAL_BASED_OUTPATIENT_CLINIC_OR_DEPARTMENT_OTHER): Payer: Medicare Other | Admitting: Oncology

## 2016-02-04 ENCOUNTER — Telehealth: Payer: Self-pay | Admitting: Oncology

## 2016-02-04 ENCOUNTER — Other Ambulatory Visit (HOSPITAL_BASED_OUTPATIENT_CLINIC_OR_DEPARTMENT_OTHER): Payer: Medicare Other

## 2016-02-04 VITALS — BP 130/54 | HR 54 | Temp 98.2°F | Resp 18 | Ht 65.0 in | Wt 111.0 lb

## 2016-02-04 DIAGNOSIS — M899 Disorder of bone, unspecified: Secondary | ICD-10-CM | POA: Diagnosis not present

## 2016-02-04 DIAGNOSIS — C9000 Multiple myeloma not having achieved remission: Secondary | ICD-10-CM

## 2016-02-04 DIAGNOSIS — D649 Anemia, unspecified: Secondary | ICD-10-CM | POA: Diagnosis not present

## 2016-02-04 LAB — CBC WITH DIFFERENTIAL/PLATELET
BASO%: 0.4 % (ref 0.0–2.0)
Basophils Absolute: 0 10*3/uL (ref 0.0–0.1)
EOS%: 1.6 % (ref 0.0–7.0)
Eosinophils Absolute: 0.1 10*3/uL (ref 0.0–0.5)
HEMATOCRIT: 28.7 % — AB (ref 34.8–46.6)
HEMOGLOBIN: 9.3 g/dL — AB (ref 11.6–15.9)
LYMPH#: 1.1 10*3/uL (ref 0.9–3.3)
LYMPH%: 22 % (ref 14.0–49.7)
MCH: 31.2 pg (ref 25.1–34.0)
MCHC: 32.3 g/dL (ref 31.5–36.0)
MCV: 96.5 fL (ref 79.5–101.0)
MONO#: 0.5 10*3/uL (ref 0.1–0.9)
MONO%: 8.9 % (ref 0.0–14.0)
NEUT%: 67.1 % (ref 38.4–76.8)
NEUTROS ABS: 3.5 10*3/uL (ref 1.5–6.5)
PLATELETS: 222 10*3/uL (ref 145–400)
RBC: 2.98 10*6/uL — ABNORMAL LOW (ref 3.70–5.45)
RDW: 12.5 % (ref 11.2–14.5)
WBC: 5.2 10*3/uL (ref 3.9–10.3)

## 2016-02-04 LAB — COMPREHENSIVE METABOLIC PANEL
ALBUMIN: 2.9 g/dL — AB (ref 3.5–5.0)
ALK PHOS: 121 U/L (ref 40–150)
ALT: 11 U/L (ref 0–55)
ANION GAP: 10 meq/L (ref 3–11)
AST: 16 U/L (ref 5–34)
BUN: 21.1 mg/dL (ref 7.0–26.0)
CALCIUM: 9.1 mg/dL (ref 8.4–10.4)
CHLORIDE: 109 meq/L (ref 98–109)
CO2: 24 mEq/L (ref 22–29)
CREATININE: 1.4 mg/dL — AB (ref 0.6–1.1)
EGFR: 40 mL/min/{1.73_m2} — ABNORMAL LOW (ref >90)
Glucose: 98 mg/dl (ref 70–140)
POTASSIUM: 4.4 meq/L (ref 3.5–5.1)
Sodium: 143 mEq/L (ref 136–145)
Total Bilirubin: 0.56 mg/dL (ref 0.20–1.20)
Total Protein: 8.2 g/dL (ref 6.4–8.3)

## 2016-02-04 NOTE — Progress Notes (Signed)
Hematology and Oncology Follow Up Visit  SHADASIA OLDFIELD [id number] [birth year] 82 y.o. 02/04/2016 1:11 PM  CC: [name], MD  [name], MD, [facility]  [name], M.D., Ph.D.    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  She had a light chain multiple myeloma with M spike of about 0.35 gm/dL.  Prior Therapy: 1. Status post prophylactic fixation impending pathological fracture of the right femur done in August 2009. 2. External beam radiation concluded in 2010 at the right femur. 3. Revlimid 25 mg daily as maintenance therapy for 2 years ending 03/2012.  Current therapy:  Under consideration to start systemic therapy.She took Revlimid 25 mg daily for one day and was discontinued due to poor tolerance.  Interim History:  Ms. Reddington presents today for a followup visit. Since her last visit, she reports took Revlimid for 1 day and tolerated it poorly. She reported nausea but no vomiting and poor by mouth intake. She also noted weight loss and hair loss as well. She discontinued after 1 day and not willing to retake it at this time.   Since stopping this medication, she feels back to normal. Appetite is excellent and her performance status is back to baseline. She continues to ambulate without any difficulties. She denied any falls or syncope. She denied any arthralgias or myalgias. He continues to use public transportation to make her appointments.  She does not report any headaches, blurry vision or syncope. She does not report any fevers or chills or sweats.  she does not report any chest pain, palpitation, orthopnea or leg edema. She does not report any cough, wheezing or hemoptysis. Her nausea have resolved at this time and denies any vomiting, abdominal pain or early satiety. She does not report any urinary symptoms of frequency urgency or  hesitancy. Remainder of her review of systems unremarkable.   Medications: I have reviewed the patient's current medications.  Current Outpatient Prescriptions  Medication Sig Dispense Refill  . amLODipine (NORVASC) 5 MG tablet Take 1 tablet (5 mg total) by mouth daily. 90 tablet 3  . aspirin 81 MG tablet Take 1 tablet (81 mg total) by mouth daily. 31 tablet 11  . Calcium Carbonate-Vitamin D (CALCIUM 600+D) 600-400 MG-UNIT per tablet Take 1 tablet by mouth daily. 30 tablet 3  . ibuprofen (ADVIL,MOTRIN) 600 MG tablet Take 1 tablet (600 mg total) by mouth every 6 (six) hours as needed for moderate pain. 30 tablet 0  . lisinopril (PRINIVIL,ZESTRIL) 40 MG tablet Take 1 tablet (40 mg total) by mouth daily. 90 tablet 3  . omeprazole (PRILOSEC) 20 MG capsule take 1 capsule by mouth once daily 30 capsule 2  . oxybutynin (DITROPAN) 5 MG tablet Take 1 tablet (5 mg total) by mouth 2 (two) times daily. 60 tablet 1  . simvastatin (ZOCOR) 20 MG tablet take 1 tablet by mouth at bedtime 30 tablet 5  . lenalidomide (REVLIMID) 25 MG capsule Take 1 capsule (25 mg total) by mouth daily. (Patient not taking: Reported on 02/04/2016) 21 capsule 0   No current facility-administered medications for this visit.   , Allergies:  Allergies  Allergen Reactions  . Morphine And Related Other (See Comments)    Abdominal Pain    Past Medical History, Surgical history, Social history, and Family History were reviewed and updated.   Physical Exam: Blood pressure Marland Kitchen)  130/54, pulse (!) 54, temperature 98.2 F (36.8 C), temperature source Oral, resp. rate 18, height _0  (1.651 m), weight 111 lb (50.3 kg), SpO2 100 %. ECOG: 1 General appearance: Well-appearing woman without distress.  Head: Normocephalic, without obvious abnormality. No oral ulcers or lesions.  Neck: no adenopathy Lymph nodes: Cervical, supraclavicular, and axillary nodes normal. Heart:regular rate and rhythm, S1, S2 normal, no murmur, click, rub or  gallop Lung:chest clear, no wheezing, rales, normal symmetric air entry Abdomen: soft, non-tender, without masses or organomegaly no shifting dullness or ascites.  EXT:no evidence of edma.  Skin: No rash or desquamation.   Lab Results: Lab Results  Component Value Date   WBC 5.2 02/04/2016   HGB 9.3 (L) 02/04/2016   HCT 28.7 (L) 02/04/2016   MCV 96.5 02/04/2016   PLT 222 02/04/2016   Results for AYME, SHORT (MRN [id number]) as of 01/07/2016 14:49  Ref. Range 12/31/2015 16:18  Ig Kappa Free Light Chain Latest Ref Range: 3.3-19.4 mg/L 3,045.6 (H)  Ig Lambda Free Light Chain Latest Ref Range: 5.7-26.3 mg/L 6.1  Kappa/Lambda FluidC Ratio Latest Ref Range: 0.26-1.65  499.28 (H)    Impression and Plan:  A 82 year old female with the following issues. 1. History of a plasmacytoma of the right femur.  She is status post surgical fixation followed by external beam radiation. No pain noted since the last visit. 2. Light chain multiple myeloma with disease predominantly in the bone marrow, about 20% involvement.Her light chain showed increased ratio recently and tried taking Revlimid and tolerated it poorly. She reported fatigue, nausea and poor by mouth intake. At this time, she is deferring any further treatment unless she has to. If she develops worsening bony disease, bone pain or worsening cytopenias we can consider Velcade therapy at that time. She is willing to consider it in the future but at this time she does not want to go with any treatment. 3. Bony disease.  Continue to be on calcium and vitamin D supplements.  We will considering starting bisphosphonates and her disease had progressed. 4. Right hip pain: No issues at this time. She continued to follow with Dr. Redmond Pulling regarding this issue. she does not take any pain medication. 5. Anemia: Could be related due to progression of disease. She does not require any growth factor support or transfusion at this time. 6. Follow up in 2  months for clinical visit.     Zola Button, MD 8/16/20171:11 PM

## 2016-02-04 NOTE — Telephone Encounter (Signed)
GAVE PATIENT AVS REPORT AND APPOINTMENTS FOR OCTOBER.  °

## 2016-02-05 LAB — KAPPA/LAMBDA LIGHT CHAINS
Ig Lambda Free Light Chain: 6.3 mg/L (ref 5.7–26.3)
Kappa/Lambda FluidC Ratio: 891.78 — ABNORMAL HIGH (ref 0.26–1.65)

## 2016-02-10 DIAGNOSIS — Z4789 Encounter for other orthopedic aftercare: Secondary | ICD-10-CM | POA: Diagnosis not present

## 2016-02-10 DIAGNOSIS — M8588 Other specified disorders of bone density and structure, other site: Secondary | ICD-10-CM | POA: Diagnosis not present

## 2016-02-10 DIAGNOSIS — C9030 Solitary plasmacytoma not having achieved remission: Secondary | ICD-10-CM | POA: Diagnosis not present

## 2016-03-30 ENCOUNTER — Other Ambulatory Visit (HOSPITAL_BASED_OUTPATIENT_CLINIC_OR_DEPARTMENT_OTHER): Payer: Medicare Other

## 2016-03-30 ENCOUNTER — Ambulatory Visit (HOSPITAL_BASED_OUTPATIENT_CLINIC_OR_DEPARTMENT_OTHER): Payer: Medicare Other | Admitting: Oncology

## 2016-03-30 ENCOUNTER — Telehealth: Payer: Self-pay | Admitting: Oncology

## 2016-03-30 VITALS — BP 136/56 | HR 58 | Temp 98.1°F | Resp 17 | Ht 65.0 in | Wt 109.1 lb

## 2016-03-30 DIAGNOSIS — D649 Anemia, unspecified: Secondary | ICD-10-CM | POA: Diagnosis not present

## 2016-03-30 DIAGNOSIS — C9000 Multiple myeloma not having achieved remission: Secondary | ICD-10-CM

## 2016-03-30 DIAGNOSIS — C9002 Multiple myeloma in relapse: Secondary | ICD-10-CM

## 2016-03-30 LAB — COMPREHENSIVE METABOLIC PANEL
ALBUMIN: 3.2 g/dL — AB (ref 3.5–5.0)
ALK PHOS: 123 U/L (ref 40–150)
ALT: 16 U/L (ref 0–55)
AST: 22 U/L (ref 5–34)
Anion Gap: 10 mEq/L (ref 3–11)
BUN: 24.8 mg/dL (ref 7.0–26.0)
CHLORIDE: 112 meq/L — AB (ref 98–109)
CO2: 21 mEq/L — ABNORMAL LOW (ref 22–29)
Calcium: 8.7 mg/dL (ref 8.4–10.4)
Creatinine: 1.3 mg/dL — ABNORMAL HIGH (ref 0.6–1.1)
EGFR: 42 mL/min/{1.73_m2} — AB (ref >90)
GLUCOSE: 88 mg/dL (ref 70–140)
POTASSIUM: 4.1 meq/L (ref 3.5–5.1)
SODIUM: 143 meq/L (ref 136–145)
Total Bilirubin: 0.79 mg/dL (ref 0.20–1.20)
Total Protein: 7.8 g/dL (ref 6.4–8.3)

## 2016-03-30 LAB — CBC WITH DIFFERENTIAL/PLATELET
BASO%: 0.5 % (ref 0.0–2.0)
BASOS ABS: 0 10*3/uL (ref 0.0–0.1)
EOS ABS: 0.1 10*3/uL (ref 0.0–0.5)
EOS%: 2.4 % (ref 0.0–7.0)
HCT: 28.8 % — ABNORMAL LOW (ref 34.8–46.6)
HGB: 9.2 g/dL — ABNORMAL LOW (ref 11.6–15.9)
LYMPH%: 34 % (ref 14.0–49.7)
MCH: 31.4 pg (ref 25.1–34.0)
MCHC: 31.8 g/dL (ref 31.5–36.0)
MCV: 98.6 fL (ref 79.5–101.0)
MONO#: 0.2 10*3/uL (ref 0.1–0.9)
MONO%: 5.8 % (ref 0.0–14.0)
NEUT#: 2.4 10*3/uL (ref 1.5–6.5)
NEUT%: 57.3 % (ref 38.4–76.8)
Platelets: 137 10*3/uL — ABNORMAL LOW (ref 145–400)
RBC: 2.92 10*6/uL — ABNORMAL LOW (ref 3.70–5.45)
RDW: 14.3 % (ref 11.2–14.5)
WBC: 4.2 10*3/uL (ref 3.9–10.3)
lymph#: 1.4 10*3/uL (ref 0.9–3.3)

## 2016-03-30 MED ORDER — LENALIDOMIDE 10 MG PO CAPS: ORAL_CAPSULE | ORAL | 0 refills | Status: DC

## 2016-03-30 NOTE — Telephone Encounter (Signed)
Gave patient avs report and appointments for November.  °

## 2016-03-30 NOTE — Progress Notes (Signed)
Hematology and Oncology Follow Up Visit  Kayla Brooks [id number] 01/29/1930 82 y.o. 03/30/2016 3:02 PM  CC: Catherine Walsh, MD  Thomas C. Wall, MD, FACC  John S Moody, M.D., Ph.D.    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  She had a light chain multiple myeloma with M spike of about 0.35 gm/dL.  Prior Therapy: 1. Status post prophylactic fixation impending pathological fracture of the right femur done in August 2009. 2. External beam radiation concluded in 2010 at the right femur. 3. Revlimid 25 mg daily as maintenance therapy for 2 years ending 03/2012.  Current therapy:  Under consideration to start systemic therapy With Revlimid at a lower dose.  Interim History:  [name] presents today for a followup visit. Since her last visit, she reports feeling reasonably fair without any major changes in her health. She continues to have loose bowel movements and it is independent of her Revlimid treatment. She denied any nausea, vomiting or abdominal pain. Her appetite remained about the same although lost more weight. She denied any falls or syncope. She denied any arthralgias or myalgias. He continues to use public transportation to make her appointments.  She does not report any headaches, blurry vision or syncope. She does not report any fevers or chills or sweats.  she does not report any chest pain, palpitation, orthopnea or leg edema. She does not report any cough, wheezing or hemoptysis. Her nausea have resolved at this time and denies any vomiting, abdominal pain or early satiety. She does not report any urinary symptoms of frequency urgency or hesitancy. Remainder of her review of systems unremarkable.   Medications: I have reviewed the patient's current medications.  Current Outpatient Prescriptions  Medication Sig Dispense Refill   . amLODipine (NORVASC) 5 MG tablet Take 1 tablet (5 mg total) by mouth daily. 90 tablet 3  . aspirin 81 MG tablet Take 1 tablet (81 mg total) by mouth daily. 31 tablet 11  . Calcium Carbonate-Vitamin D (CALCIUM 600+D) 600-400 MG-UNIT per tablet Take 1 tablet by mouth daily. 30 tablet 3  . ibuprofen (ADVIL,MOTRIN) 600 MG tablet Take 1 tablet (600 mg total) by mouth every 6 (six) hours as needed for moderate pain. 30 tablet 0  . lisinopril (PRINIVIL,ZESTRIL) 40 MG tablet Take 1 tablet (40 mg total) by mouth daily. 90 tablet 3  . omeprazole (PRILOSEC) 20 MG capsule take 1 capsule by mouth once daily 30 capsule 2  . oxybutynin (DITROPAN) 5 MG tablet Take 1 tablet (5 mg total) by mouth 2 (two) times daily. 60 tablet 1  . simvastatin (ZOCOR) 20 MG tablet take 1 tablet by mouth at bedtime 30 tablet 5  . lenalidomide (REVLIMID) 10 MG capsule Take one capsule daily for 21 days then 7 days off. 21 capsule 0   No current facility-administered medications for this visit.   , Allergies:  Allergies  Allergen Reactions  . Morphine And Related Other (See Comments)    Abdominal Pain    Past Medical History, Surgical history, Social history, and Family History were reviewed and updated.   Physical Exam: Blood pressure (!) 136/56, pulse (!) 58, temperature 98.1 F (36.7 C), temperature source Oral, resp. rate 17, height 5' 5" (1.651 m), weight 109 lb 1.6 oz (49.5 kg), SpO2 100 %. ECOG: 1 General appearance: Alert, awake woman without distress. Head: Normocephalic, without obvious   abnormality. No oral thrush noted.  Neck: no adenopathy Lymph nodes: Cervical, supraclavicular, and axillary nodes normal. Heart:regular rate and rhythm, S1, S2 normal, no murmur, click, rub or gallop Lung:chest clear, no wheezing, rales, normal symmetric air entry Abdomen: soft, non-tender, without masses or organomegaly no rebound or guarding. EXT:no evidence of edma.  Skin: No rash or desquamation.   Lab Results: Lab  Results  Component Value Date   WBC 4.2 03/30/2016   HGB 9.2 (L) 03/30/2016   HCT 28.8 (L) 03/30/2016   MCV 98.6 03/30/2016   PLT 137 (L) 03/30/2016   Results for Duplessis, Angellynn D (MRN [id number]) as of 03/30/2016 14:49  Ref. Range 02/04/2016 12:46  Ig Kappa Free Light Chain Latest Ref Range: 3.3 - 19.4 mg/L 5,618.2 (H)  Ig Lambda Free Light Chain Latest Ref Range: 5.7 - 26.3 mg/L 6.3  Kappa/Lambda FluidC Ratio Latest Ref Range: 0.26 - 1.65  891.78 (H)     Impression and Plan:  A 82-year-old female with the following issues. 1. History of a plasmacytoma of the right femur.  She is status post surgical fixation followed by external beam radiation. No pain noted since the last visit. 2. Light chain multiple myeloma with disease predominantly in the bone marrow with 20% involvement. Her light chain proteins continue to rise rapidly and her disease is certainly progressing. Options of therapy were reviewed again including continued observation or restart of treatment. I feel restarting treatment is necessary as she will become severely symptomatic from her disease. These options include other relevant in the lower dose versus Velcade-based therapy. Risks and benefits of both approaches were reviewed and she is agreeable to try Revlimid a lower dose. I will start her at 10 mg daily for 21 days with 1 week off. 3. Bony disease.  Continue to be on calcium and vitamin D supplements.  We will considering starting bisphosphonates and her disease had progressed. 4. Right hip pain: No issues at this time. Her skeletal survey in July 2017 showed no new lesions. 5. Anemia: Could be related due to progression of disease. She does not require any growth factor support or transfusion at this time. 6. Follow up in 1 months for clinical visit.     SHADAD,FIRAS, MD 10/10/20173:02 PM 

## 2016-03-31 LAB — KAPPA/LAMBDA LIGHT CHAINS: IG LAMBDA FREE LIGHT CHAIN: 4 mg/L — AB (ref 5.7–26.3)

## 2016-04-05 ENCOUNTER — Other Ambulatory Visit: Payer: Self-pay | Admitting: Internal Medicine

## 2016-04-05 DIAGNOSIS — N3941 Urge incontinence: Secondary | ICD-10-CM

## 2016-04-20 ENCOUNTER — Other Ambulatory Visit: Payer: Self-pay | Admitting: *Deleted

## 2016-04-20 MED ORDER — LENALIDOMIDE 10 MG PO CAPS: ORAL_CAPSULE | ORAL | 0 refills | Status: DC

## 2016-05-05 ENCOUNTER — Ambulatory Visit (HOSPITAL_BASED_OUTPATIENT_CLINIC_OR_DEPARTMENT_OTHER): Payer: Medicare Other | Admitting: Oncology

## 2016-05-05 ENCOUNTER — Telehealth: Payer: Self-pay | Admitting: Oncology

## 2016-05-05 ENCOUNTER — Telehealth: Payer: Self-pay | Admitting: *Deleted

## 2016-05-05 ENCOUNTER — Other Ambulatory Visit (HOSPITAL_BASED_OUTPATIENT_CLINIC_OR_DEPARTMENT_OTHER): Payer: Medicare Other

## 2016-05-05 VITALS — BP 116/56 | HR 65 | Temp 97.5°F | Resp 18 | Wt 104.6 lb

## 2016-05-05 DIAGNOSIS — C9002 Multiple myeloma in relapse: Secondary | ICD-10-CM

## 2016-05-05 DIAGNOSIS — R630 Anorexia: Secondary | ICD-10-CM

## 2016-05-05 LAB — CBC WITH DIFFERENTIAL/PLATELET
BASO%: 0.5 % (ref 0.0–2.0)
Basophils Absolute: 0 10*3/uL (ref 0.0–0.1)
EOS%: 2.9 % (ref 0.0–7.0)
Eosinophils Absolute: 0.1 10*3/uL (ref 0.0–0.5)
HEMATOCRIT: 25.6 % — AB (ref 34.8–46.6)
HGB: 8.7 g/dL — ABNORMAL LOW (ref 11.6–15.9)
LYMPH#: 1 10*3/uL (ref 0.9–3.3)
LYMPH%: 27.2 % (ref 14.0–49.7)
MCH: 31.9 pg (ref 25.1–34.0)
MCHC: 34 g/dL (ref 31.5–36.0)
MCV: 93.8 fL (ref 79.5–101.0)
MONO#: 0.3 10*3/uL (ref 0.1–0.9)
MONO%: 7.1 % (ref 0.0–14.0)
NEUT%: 62.3 % (ref 38.4–76.8)
NEUTROS ABS: 2.4 10*3/uL (ref 1.5–6.5)
Platelets: 148 10*3/uL (ref 145–400)
RBC: 2.73 10*6/uL — ABNORMAL LOW (ref 3.70–5.45)
RDW: 12.5 % (ref 11.2–14.5)
WBC: 3.8 10*3/uL — AB (ref 3.9–10.3)

## 2016-05-05 LAB — COMPREHENSIVE METABOLIC PANEL
ALT: 10 U/L (ref 0–55)
AST: 20 U/L (ref 5–34)
Albumin: 2.9 g/dL — ABNORMAL LOW (ref 3.5–5.0)
Alkaline Phosphatase: 129 U/L (ref 40–150)
Anion Gap: 18 mEq/L — ABNORMAL HIGH (ref 3–11)
BILIRUBIN TOTAL: 0.84 mg/dL (ref 0.20–1.20)
BUN: 41 mg/dL — AB (ref 7.0–26.0)
CALCIUM: 6.1 mg/dL — AB (ref 8.4–10.4)
CHLORIDE: 105 meq/L (ref 98–109)
CO2: 17 meq/L — AB (ref 22–29)
CREATININE: 3.4 mg/dL — AB (ref 0.6–1.1)
EGFR: 13 mL/min/{1.73_m2} — ABNORMAL LOW (ref >90)
Glucose: 115 mg/dl (ref 70–140)
Potassium: 2.8 mEq/L — CL (ref 3.5–5.1)
Sodium: 140 mEq/L (ref 136–145)
TOTAL PROTEIN: 8.8 g/dL — AB (ref 6.4–8.3)

## 2016-05-05 MED ORDER — POTASSIUM CHLORIDE CRYS ER 20 MEQ PO TBCR: 20.0000 meq | EXTENDED_RELEASE_TABLET | Freq: Every day | ORAL | 0 refills | Status: DC

## 2016-05-05 MED ORDER — PROCHLORPERAZINE MALEATE 10 MG PO TABS: 10.0000 mg | ORAL_TABLET | Freq: Four times a day (QID) | ORAL | 0 refills | Status: DC | PRN

## 2016-05-05 NOTE — Telephone Encounter (Signed)
As noted below by Dr. Alen Blew, I tried calling patient but not able to leave a message on her cell phone. Patient needs to stop Revlimid until she sees him again, 2.) increase fluids and 3.) take potassium daily for 7 days. Prescription has been called into [facility] for potassium and Compazine. I will continue to try calling patient.

## 2016-05-05 NOTE — Telephone Encounter (Signed)
Follow up appointments and labs scheduled, per 05/05/16 los. A copy of the  AVS report and appointment schedule, was given to patient per 05/05/16 los.  ° °

## 2016-05-05 NOTE — Telephone Encounter (Signed)
This RN called and spoke with son, [name], since patient has not returned my phone call. I informed him of the following issues: 1.) STOP Revlimid until she sees Dr. Alen Blew at next visit, 2.) increase fluids daily, around 64 ounces, 3.) her potassium level was 2.8. A prescription was called into [facility] and she needs to take one tablet daily for seven days, and 4.) compazine was called in for nausea. Son read information back to me and will get in touch with his mother. Instructed him to let her know if she had any questions or concerns to call [phone]. He verbalized understanding.

## 2016-05-05 NOTE — Telephone Encounter (Signed)
-----   Message from Wyatt Portela, MD sent at 05/05/2016  2:41 PM EST ----- Please let her know her K is low and she is dehydrated.   She needs to: 1. Stop Revilimid till I see her next visit. 2. Drink plenty of fluids.  3. Replace K with K-dur 20 meq daily for one week.

## 2016-05-05 NOTE — Progress Notes (Signed)
Hematology and Oncology Follow Up Visit  Kayla Brooks [id number] Nov 06, 1929 82 y.o. 05/05/2016 1:31 PM  CC: Kayla Ser, MD  Kayla Brooks. Kayla Blalock, MD, Kayla Brooks  Kayla Brooks, M.D., Ph.D.    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  She had a light chain multiple myeloma with M spike of about 0.35 gm/dL.  Prior Therapy: 1. Status post prophylactic fixation impending pathological fracture of the right femur done in August 2009. 2. External beam radiation concluded in 2010 at the right femur. 3. Revlimid 25 mg daily as maintenance therapy for 2 years ending 03/2012.  Current therapy:  Revlimid 10 mg daily started in October 2017. She is here for evaluation in the beginning of cycle 2.  Interim History:  Ms. Chacko presents today for a followup visit. Since her last visit, she started Revlimid and completed the first cycle of therapy. She took it for 3 weeks on and one-week off. She did report some side effects associated with that include mild nausea but no vomiting. She reports slight decrease in her appetite and of lost weight since last visit. She continues to have loose bowel movements and it is independent of her Revlimid treatment. She denied any pathological fractures or falls. She continues to be active and attends to activities of daily living. She is still able to make it to her appointments using public transportation.   She does not report any headaches, blurry vision or syncope. She does not report any fevers or chills or sweats.  she does not report any chest pain, palpitation, orthopnea or leg edema. She does not report any cough, wheezing or hemoptysis. Her nausea have resolved at this time and denies any vomiting, abdominal pain or early satiety. She does not report any urinary symptoms of frequency urgency or hesitancy. Remainder of her  review of systems unremarkable.   Medications: I have reviewed the patient's current medications.  Current Outpatient Prescriptions  Medication Sig Dispense Refill  . amLODipine (NORVASC) 5 MG tablet Take 1 tablet (5 mg total) by mouth daily. 90 tablet 3  . aspirin 81 MG tablet Take 1 tablet (81 mg total) by mouth daily. 31 tablet 11  . Calcium Carbonate-Vitamin D (CALCIUM 600+D) 600-400 MG-UNIT per tablet Take 1 tablet by mouth daily. 30 tablet 3  . ibuprofen (ADVIL,MOTRIN) 600 MG tablet Take 1 tablet (600 mg total) by mouth every 6 (six) hours as needed for moderate pain. 30 tablet 0  . lenalidomide (REVLIMID) 10 MG capsule Take one capsule daily for 21 days then 7 days off. 21 capsule 0  . lisinopril (PRINIVIL,ZESTRIL) 40 MG tablet Take 1 tablet (40 mg total) by mouth daily. 90 tablet 3  . omeprazole (PRILOSEC) 20 MG capsule take 1 capsule by mouth once daily 30 capsule 2  . oxybutynin (DITROPAN) 5 MG tablet take 1 tablet by mouth twice a day 60 tablet 1  . simvastatin (ZOCOR) 20 MG tablet take 1 tablet by mouth at bedtime 30 tablet 5   No current facility-administered medications for this visit.   , Allergies:  Allergies  Allergen Reactions  . Morphine And Related Other (See Comments)    Abdominal Pain    Past Medical History, Surgical history, Social history, and Family History were reviewed and updated.   Physical Exam: Blood pressure (!) 116/56, pulse 65, temperature 97.5 F (36.4 C), temperature source  Oral, resp. rate 18, weight 104 lb 9.6 oz (47.4 kg), SpO2 100 %. ECOG: 1 General appearance:Frail appearing woman without distress.  Head: Normocephalic, without obvious abnormality. No oral ulcers or lesions. Neck: no adenopathy Lymph nodes: Cervical, supraclavicular, and axillary nodes normal. Heart:regular rate and rhythm, S1, S2 normal, no murmur, click, rub or gallop Lung:chest clear, no wheezing, rales, normal symmetric air entry Abdomen: soft, non-tender, without  masses or organomegaly no shifting dullness or ascites. EXT:no evidence of edma.  Skin: No rash or desquamation.   Lab Results: Lab Results  Component Value Date   WBC 3.8 (L) 05/05/2016   HGB 8.7 (L) 05/05/2016   HCT 25.6 (L) 05/05/2016   MCV 93.8 05/05/2016   PLT 148 05/05/2016    Results for Kayla Brooks (MRN [id number]) as of 05/05/2016 13:00  Ref. Range 02/04/2016 12:46 03/30/2016 14:40  Ig Kappa Free Light Chain Latest Ref Range: 3.3 - 19.4 mg/L 5,618.2 (H) 4,430.2 (H)  Ig Lambda Free Light Chain Latest Ref Range: 5.7 - 26.3 mg/L 6.3 4.0 (L)  Kappa/Lambda FluidC Ratio Latest Ref Range: 0.26 - 1.65  891.78 (H) 1,107.55 (H)     Impression and Plan:  A 82 year old female with the following issues. 1. History of a plasmacytoma of the right femur.  She is status post surgical fixation followed by external beam radiation. No pain noted since the last visit. 2. Light chain multiple myeloma with disease predominantly in the bone marrow with 20% involvement. Her Kappa light chains continue to increase with increase ratio compared to lambda. She started Revlimid at a lower dose and tolerated it with few complications. These include nausea and anorexia. We have discussed different strategies moving forward including changing her symptoms associated with this medication and continue with the same dose and schedule versus discontinuation. She is agreeable to give this regimen another cycle and determine afterwards. Different salvage regimen will be utilized if she develops worsening symptoms. 3. Bony disease.  Continue to be on calcium and vitamin D supplements.  We will considering starting bisphosphonates and her disease had progressed. 4. Right hip pain: No issues at this time. Her skeletal survey in July 2017 showed no new lesions. 5. Anemia: Could be related due to progression of disease. She does not require any growth factor support or transfusion at this time. 6. Anorexia: We have  discussed different strategies to boost your oral intake. Nutritional supplements also were recommended. 7. Antiemetics: Prescription for Compazine will be available to the patient to pick up as needed. 8. Follow up in 1 months for clinical visit.     RSWNIO,EVOJJ, MD 11/15/20171:31 PM

## 2016-05-06 ENCOUNTER — Encounter: Payer: Self-pay | Admitting: Oncology

## 2016-05-06 LAB — KAPPA/LAMBDA LIGHT CHAINS
Ig Kappa Free Light Chain: 9574.5 mg/L — ABNORMAL HIGH (ref 3.3–19.4)
Ig Lambda Free Light Chain: 8.6 mg/L (ref 5.7–26.3)
Kappa/Lambda FluidC Ratio: 1113.31 — ABNORMAL HIGH (ref 0.26–1.65)

## 2016-05-06 NOTE — Progress Notes (Signed)
Rcvd physician form from [facility] for Revlimid.  Faxed signed form to [facility] today.

## 2016-05-28 ENCOUNTER — Ambulatory Visit: Payer: Self-pay | Admitting: Oncology

## 2016-05-28 ENCOUNTER — Other Ambulatory Visit: Payer: Self-pay

## 2016-07-11 ENCOUNTER — Other Ambulatory Visit: Payer: Self-pay | Admitting: Internal Medicine

## 2016-07-11 DIAGNOSIS — I10 Essential (primary) hypertension: Secondary | ICD-10-CM

## 2016-08-18 ENCOUNTER — Emergency Department (HOSPITAL_COMMUNITY): Payer: Medicare Other

## 2016-08-18 ENCOUNTER — Encounter (HOSPITAL_COMMUNITY): Payer: Self-pay | Admitting: Emergency Medicine

## 2016-08-18 ENCOUNTER — Emergency Department (HOSPITAL_COMMUNITY)
Admission: EM | Admit: 2016-08-18 | Discharge: 2016-08-18 | Disposition: A | Discharge status: Home / Self Care | Payer: Medicare Other | Attending: Emergency Medicine | Admitting: Emergency Medicine

## 2016-08-18 DIAGNOSIS — M25511 Pain in right shoulder: Secondary | ICD-10-CM | POA: Insufficient documentation

## 2016-08-18 DIAGNOSIS — I10 Essential (primary) hypertension: Secondary | ICD-10-CM | POA: Insufficient documentation

## 2016-08-18 DIAGNOSIS — Z79899 Other long term (current) drug therapy: Secondary | ICD-10-CM | POA: Diagnosis not present

## 2016-08-18 DIAGNOSIS — M791 Myalgia: Secondary | ICD-10-CM | POA: Diagnosis not present

## 2016-08-18 DIAGNOSIS — M7918 Myalgia, other site: Secondary | ICD-10-CM

## 2016-08-18 DIAGNOSIS — R0789 Other chest pain: Secondary | ICD-10-CM | POA: Insufficient documentation

## 2016-08-18 DIAGNOSIS — Z7982 Long term (current) use of aspirin: Secondary | ICD-10-CM | POA: Diagnosis not present

## 2016-08-18 DIAGNOSIS — M545 Low back pain: Secondary | ICD-10-CM | POA: Insufficient documentation

## 2016-08-18 DIAGNOSIS — M546 Pain in thoracic spine: Secondary | ICD-10-CM | POA: Diagnosis not present

## 2016-08-18 DIAGNOSIS — R9431 Abnormal electrocardiogram [ECG] [EKG]: Secondary | ICD-10-CM | POA: Diagnosis not present

## 2016-08-18 DIAGNOSIS — R079 Chest pain, unspecified: Secondary | ICD-10-CM | POA: Diagnosis not present

## 2016-08-18 DIAGNOSIS — Z87891 Personal history of nicotine dependence: Secondary | ICD-10-CM | POA: Diagnosis not present

## 2016-08-18 LAB — URINALYSIS, ROUTINE W REFLEX MICROSCOPIC
BILIRUBIN URINE: NEGATIVE
Glucose, UA: NEGATIVE mg/dL
KETONES UR: NEGATIVE mg/dL
LEUKOCYTES UA: NEGATIVE
Nitrite: NEGATIVE
PROTEIN: 30 mg/dL — AB
Specific Gravity, Urine: 1.004 — ABNORMAL LOW (ref 1.005–1.030)
WBC, UA: NONE SEEN WBC/hpf (ref 0–5)
pH: 6 (ref 5.0–8.0)

## 2016-08-18 LAB — COMPREHENSIVE METABOLIC PANEL
ALBUMIN: 3.3 g/dL — AB (ref 3.5–5.0)
ALT: 15 U/L (ref 14–54)
ANION GAP: 10 (ref 5–15)
AST: 21 U/L (ref 15–41)
Alkaline Phosphatase: 100 U/L (ref 38–126)
BUN: 15 mg/dL (ref 6–20)
CHLORIDE: 109 mmol/L (ref 101–111)
CO2: 22 mmol/L (ref 22–32)
Calcium: 8.9 mg/dL (ref 8.9–10.3)
Creatinine, Ser: 0.79 mg/dL (ref 0.44–1.00)
GFR calc Af Amer: >60 mL/min (ref >60)
GFR calc non Af Amer: >60 mL/min (ref >60)
GLUCOSE: 100 mg/dL — AB (ref 65–99)
POTASSIUM: 3.2 mmol/L — AB (ref 3.5–5.1)
SODIUM: 141 mmol/L (ref 135–145)
Total Bilirubin: 0.6 mg/dL (ref 0.3–1.2)
Total Protein: 7.1 g/dL (ref 6.5–8.1)

## 2016-08-18 LAB — I-STAT TROPONIN, ED
TROPONIN I, POC: 0 ng/mL (ref 0.00–0.08)
TROPONIN I, POC: 0.01 ng/mL (ref 0.00–0.08)

## 2016-08-18 LAB — APTT: aPTT: 31 seconds (ref 24–36)

## 2016-08-18 LAB — PROTIME-INR
INR: 1.13
PROTHROMBIN TIME: 14.5 s (ref 11.4–15.2)

## 2016-08-18 LAB — I-STAT CG4 LACTIC ACID, ED
Lactic Acid, Venous: 0.91 mmol/L (ref 0.5–1.9)
Lactic Acid, Venous: 0.93 mmol/L (ref 0.5–1.9)

## 2016-08-18 LAB — LIPASE, BLOOD: Lipase: 12 U/L (ref 11–51)

## 2016-08-18 MED ORDER — IOPAMIDOL (ISOVUE-370) INJECTION 76%
INTRAVENOUS | Status: AC
  Administered 2016-08-18: 100 mL
  Filled 2016-08-18: qty 100

## 2016-08-18 MED ORDER — HYDROMORPHONE HCL 2 MG/ML IJ SOLN
0.5000 mg | Freq: Once | INTRAMUSCULAR | Status: AC
  Administered 2016-08-18: 0.5 mg via INTRAVENOUS
  Filled 2016-08-18: qty 1

## 2016-08-18 MED ORDER — OXYCODONE-ACETAMINOPHEN 5-325 MG PO TABS
1.0000 | ORAL_TABLET | Freq: Once | ORAL | Status: AC
  Administered 2016-08-18: 1 via ORAL
  Filled 2016-08-18: qty 1

## 2016-08-18 MED ORDER — FENTANYL CITRATE (PF) 100 MCG/2ML IJ SOLN
12.5000 ug | Freq: Once | INTRAMUSCULAR | Status: AC
  Administered 2016-08-18: 12.5 ug via INTRAVENOUS
  Filled 2016-08-18: qty 2

## 2016-08-18 MED ORDER — POTASSIUM CHLORIDE CRYS ER 20 MEQ PO TBCR
20.0000 meq | EXTENDED_RELEASE_TABLET | Freq: Once | ORAL | Status: AC
  Administered 2016-08-18: 20 meq via ORAL
  Filled 2016-08-18: qty 1

## 2016-08-18 MED ORDER — ONDANSETRON HCL 4 MG/2ML IJ SOLN
4.0000 mg | Freq: Once | INTRAMUSCULAR | Status: AC
  Administered 2016-08-18: 4 mg via INTRAVENOUS
  Filled 2016-08-18: qty 2

## 2016-08-18 MED ORDER — ONDANSETRON HCL 4 MG/2ML IJ SOLN: 4.0000 mg | Freq: Once | INTRAMUSCULAR | Status: DC

## 2016-08-18 MED ORDER — SODIUM CHLORIDE 0.9 % IV BOLUS (SEPSIS)
1000.0000 mL | Freq: Once | INTRAVENOUS | Status: AC
  Administered 2016-08-18: 1000 mL via INTRAVENOUS

## 2016-08-18 MED ORDER — OXYCODONE-ACETAMINOPHEN 5-325 MG PO TABS: 1.0000 | ORAL_TABLET | Freq: Four times a day (QID) | ORAL | 0 refills | Status: DC | PRN

## 2016-08-18 MED ORDER — DICLOFENAC SODIUM 1 % TD GEL: 2.0000 g | Freq: Four times a day (QID) | TRANSDERMAL | 0 refills | Status: DC

## 2016-08-18 MED ORDER — LIDOCAINE 5 % EX PTCH
1.0000 | MEDICATED_PATCH | CUTANEOUS | Status: DC
  Administered 2016-08-18: 1 via TRANSDERMAL
  Filled 2016-08-18: qty 1

## 2016-08-18 NOTE — ED Triage Notes (Signed)
Pt sts right shoulder pain into left and left hip; pt sts hx of bone CA in remission per pt; pt sts pain x months

## 2016-08-18 NOTE — ED Provider Notes (Signed)
[facility] DEPT Provider Note   CSN: [id number] Arrival date & time: 08/18/16  1458     History   Chief Complaint Chief Complaint  Patient presents with  . Generalized Body Aches    HPI Kayla Brooks is a 83 y.o. female with complex pmh including HTN, anemia, plasmacytoma of the right femur s/p preventative surgical fixation followed by external beam radiation and active multiple myeloma with 20% involvement of bone marrow followed by Dr. Alen Blew (oncology) treated with one cycle of Revlimid and discontinuation of second cycle of Revlimid in November 2017 due to adverse effects of nausea, anorexia and diarrhea.  Patient presents to ED with 9/10 pain that started in right shoulder/axilla that has since spread to left upper/lower back, left shoulder/axilla and left lateral chest wall and flank and left hip x 3-4 weeks.  Patient states she has had nausea and diarrhea ever since starting on Revlimid, nausea has started to improve but diarrhea persists.  Patient denies chest pain, shortness of breath, fevers, night sweats/chills, abdominal pain, vomiting, dark/black stools, urinary symptoms.  Patient states her oncologists stopped her Revlimid in November 2017 and she is supposed to make another appointment to discuss further oncologic interventions. No h/o DVT/PE, no hemoptysis, no LE asymmetric swelling, calf pain.  No recent falls.   Patient currently reports 9/10 upper extremities, back, left wall, flank and hip pain.    HPI  Past Medical History:  Diagnosis Date  . Arthritis    "right knee" (06/10/2015)  . Chronic anemia   . Chronic diarrhea    Archie Endo 06/10/2015  . GERD (gastroesophageal reflux disease)   . Hyperlipidemia   . Hypertension   . Monoclonal paraproteinemia   . Multiple myeloma without mention of remission   . Plasmacytoma of bone ([place])    right femur/notes 06/04/2015  . Pneumonia    "years ago" (06/10/2015)  . Urinary frequency     Patient Active Problem  List   Diagnosis Date Noted  . Healthcare maintenance 01/02/2015  . Urge incontinence 12/11/2014  . Tinnitus 10/02/2012  . Multiple myeloma ([place]) 03/27/2009  . HIP PAIN, RIGHT 03/27/2009  . DYSLIPIDEMIA 05/29/2008  . Essential hypertension 05/04/2006  . GERD 05/04/2006    Past Surgical History:  Procedure Laterality Date  . BONE MARROW BIOPSY     "for multiple myeloma"  . CATARACT EXTRACTION Right   . FEMUR SURGERY Right 01/2008   1.  prophylactic fixation impending pathological fracture of the femur/notes 06/04/2015  . FRACTURE SURGERY    . VAGINAL HYSTERECTOMY     "partial"    OB History    No data available       Home Medications    Prior to Admission medications   Medication Sig Start Date End Date Taking? Authorizing Provider  amLODipine (NORVASC) 5 MG tablet take 1 tablet by mouth once daily 07/12/16  Yes Axel Filler, MD  aspirin 81 MG tablet Take 1 tablet (81 mg total) by mouth daily. 09/08/12  Yes Trish Fountain, MD  ibuprofen (ADVIL,MOTRIN) 600 MG tablet Take 1 tablet (600 mg total) by mouth every 6 (six) hours as needed for moderate pain. 06/11/15  Yes Maryellen Pile, MD  lisinopril (PRINIVIL,ZESTRIL) 40 MG tablet Take 1 tablet (40 mg total) by mouth daily. 11/07/15  Yes Axel Filler, MD  nitroGLYCERIN (NITROSTAT) 0.4 MG SL tablet Place 0.4 mg under the tongue every 5 (five) minutes as needed for chest pain.   Yes Historical Provider, MD  omeprazole (PRILOSEC) 20  MG capsule take 1 capsule by mouth once daily 12/05/15  Yes Axel Filler, MD  oxybutynin [facility]) 5 MG tablet take 1 tablet by mouth twice a day 04/06/16  Yes Axel Filler, MD  prochlorperazine (COMPAZINE) 10 MG tablet Take 1 tablet (10 mg total) by mouth every 6 (six) hours as needed for nausea or vomiting. 05/05/16  Yes Wyatt Portela, MD  simvastatin (ZOCOR) 20 MG tablet take 1 tablet by mouth at bedtime 04/10/15  Yes Corky Sox, MD  diclofenac sodium (VOLTAREN)  1 % GEL Apply 2 g topically 4 (four) times daily. 08/18/16   Nicole Pisciotta, PA-C  oxyCODONE-acetaminophen (PERCOCET) 5-325 MG tablet Take 1 tablet by mouth every 6 (six) hours as needed. 08/18/16   Monico Blitz, PA-C    Family History History reviewed. No pertinent family history.  Social History Social History  Substance Use Topics  . Smoking status: Former Smoker    Packs/day: 2.00    Years: 40.00    Types: Cigarettes  . Smokeless tobacco: Never Used     Comment: "quit smoking years ago"  . Alcohol use 0.0 oz/week     Comment: 06/10/2015 "drank socially in my younger days"     Allergies   Morphine and related   Review of Systems Review of Systems  Constitutional: Positive for appetite change. Negative for chills and fever.  HENT: Negative for congestion and sore throat.   Eyes: Negative for visual disturbance.  Respiratory: Negative for cough, choking, chest tightness and shortness of breath.   Cardiovascular: Negative for chest pain, palpitations and leg swelling.  Gastrointestinal: Positive for diarrhea and nausea. Negative for abdominal pain, constipation and vomiting.  Genitourinary: Negative for difficulty urinating and hematuria.  Musculoskeletal: Positive for arthralgias and back pain.  Skin: Negative for rash and wound.  Allergic/Immunologic: Positive for immunocompromised state.  Neurological: Negative for dizziness, seizures, syncope, weakness, light-headedness, numbness and headaches.  Hematological: Does not bruise/bleed easily.  Psychiatric/Behavioral: Negative.      Physical Exam Updated Vital Signs BP 178/55   Pulse 69   Temp 98 F (36.7 C) (Oral)   Resp 17   SpO2 100%   Physical Exam  Constitutional: She is oriented to person, place, and time. She appears well-developed and well-nourished. No distress.  HENT:  Head: Normocephalic and atraumatic.  Nose: Nose normal.  Mouth/Throat: Oropharynx is clear and moist. No oropharyngeal exudate.    Eyes: Conjunctivae and EOM are normal. Pupils are equal, round, and reactive to light.  Neck: Normal range of motion. Neck supple. No JVD present.  Cardiovascular: Normal rate, regular rhythm, normal heart sounds and intact distal pulses.   No murmur heard. Pulmonary/Chest: Effort normal and breath sounds normal. No respiratory distress. She has no wheezes. She has no rales. She exhibits no tenderness.  Abdominal: Soft. Bowel sounds are normal. She exhibits no distension and no mass. There is no tenderness. There is no rebound and no guarding.  Musculoskeletal: She exhibits tenderness. She exhibits no deformity.  No obvious bony deformity including edema, warmth or crepitus over large joints in upper and lower extremities.  Patient reports current pain over right shoulder, left shoulder, left upper/lower back, left lateral chest wall/flank and left hip, this pain is not exacerbated or changed by palpation.  Pain worsens with active shoulder range of motion and trunk movements.   Lymphadenopathy:    She has no cervical adenopathy.  Neurological: She is alert and oriented to person, place, and time. No sensory deficit.  Skin: Skin is warm and dry. Capillary refill takes less than 2 seconds.  Psychiatric: She has a normal mood and affect. Her behavior is normal. Judgment and thought content normal.  Nursing note and vitals reviewed.    ED Treatments / Results  Labs (all labs ordered are listed, but only abnormal results are displayed) Labs Reviewed  COMPREHENSIVE METABOLIC PANEL - Abnormal; Notable for the following:       Result Value   Potassium 3.2 (*)    Glucose, Bld 100 (*)    Albumin 3.3 (*)    All other components within normal limits  URINALYSIS, ROUTINE W REFLEX MICROSCOPIC - Abnormal; Notable for the following:    Color, Urine COLORLESS (*)    Specific Gravity, Urine 1.004 (*)    Hgb urine dipstick SMALL (*)    Protein, ur 30 (*)    Bacteria, UA RARE (*)    Squamous  Epithelial / LPF 0-5 (*)    All other components within normal limits  URINE CULTURE  LIPASE, BLOOD  PROTIME-INR  APTT  I-STAT TROPOININ, ED  I-STAT CG4 LACTIC ACID, ED  I-STAT CG4 LACTIC ACID, ED  I-STAT TROPOININ, ED    EKG  EKG Interpretation  Date/Time:  Wednesday August 18 2016 20:18:39 EST Ventricular Rate:  79 PR Interval:    QRS Duration: 103 QT Interval:  412 QTC Calculation: 473 R Axis:   -52 Text Interpretation:  Sinus or ectopic atrial rhythm Atrial premature complex Left anterior fascicular block Abnormal R-wave progression, early transition Probable left ventricular hypertrophy Anterior Q waves, possibly due to LVH When compared to prior, no significant changes seen.  No STEMI Confirmed by Sherry Ruffing MD, [place] [phone]) on 08/18/2016 8:24:43 PM Also confirmed by Sherry Ruffing MD, [place] [phone]), editor [place], Leda Gauze [phone])  on 08/19/2016 7:04:43 AM       Radiology Dg Chest 2 View  Result Date: 08/18/2016 CLINICAL DATA:  Right shoulder and chest pain for several days, history of multiple myeloma EXAM: CHEST  2 VIEW COMPARISON:  Metastatic bone survey of 01/07/2016 FINDINGS: The lungs remain somewhat hyperaerated. Biapical pleural thickening is stable. Mediastinal and hilar contours are unchanged. Cardiomegaly is stable. No acute bony abnormality is seen. The bones appear somewhat osteopenic particularly the scapula and myelomatous lesions cannot be excluded. IMPRESSION: 1. Stable cardiomegaly. No active lung disease. No change in hyper aeration. 2. Somewhat mottled appearance of the bones diffusely may indicate osteopenia but changes of multiple myeloma cannot be excluded. Electronically Signed   By: Ivar Drape M.D.   On: 08/18/2016 15:58   Ct Angio Chest Pe W And/or Wo Contrast  Result Date: 08/18/2016 CLINICAL DATA:  83 y/o  F; worsening left-sided chest pain. EXAM: CT ANGIOGRAPHY CHEST WITH CONTRAST TECHNIQUE: Multidetector CT imaging of the chest was performed  using the standard protocol during bolus administration of intravenous contrast. Multiplanar CT image reconstructions and MIPs were obtained to evaluate the vascular anatomy. CONTRAST:  100 cc Isovue 370 COMPARISON:  08/18/2016 chest radiograph. FINDINGS: Cardiovascular: Normal caliber thoracic aorta. Normal caliber main pulmonary artery. Satisfactory opacification of the pulmonary arteries to the segmental level. No pulmonary embolus identified. Moderate cardiomegaly with prominent right atrial enlargement. Moderate coronary artery calcification. Aortic atherosclerosis with moderate calcification. Bovine arch, normal variant. Mediastinum/Nodes: Normal thyroid gland, patulous esophagus, no mediastinal adenopathy. Lungs/Pleura: Few scattered upper lobe pulmonary nodules the largest in the right upper lobe measuring 5 mm (series 407, image 18). Mild centrilobular emphysema. No consolidation, effusion, or pneumothorax. Upper Abdomen: No  acute abnormality. Musculoskeletal: Diffuse mottled sclerosis of the bones compatible with history of multiple myeloma. No loss of vertebral body height or displaced fracture identified. Large T11 superior endplate Schmorl's node. Review of the MIP images confirms the above findings. IMPRESSION: 1. No pulmonary embolus identified. 2. No acute process identified. 3. Moderate cardiomegaly with prominent right atrial enlargement. 4. Moderate coronary artery calcification. 5. Aortic atherosclerosis. 6. Patulous esophagus. 7. Mild centrilobular emphysema of the lungs. 8. Scattered pulmonary nodules in upper lobes the largest measuring 5 mm in the right lung apex. No follow-up needed if patient is low-risk (and has no known or suspected primary neoplasm). Non-contrast chest CT can be considered in 12 months if patient is high-risk. This recommendation follows the consensus statement: Guidelines for Management of Incidental Pulmonary Nodules Detected on CT Images: From the Fleischner Society  2017; Radiology 2017; 284:228-243. 9. Diffuse mottled appearance of the bones compatible with history of multiple myeloma. No acute fracture identified. Electronically Signed   By: Kristine Garbe M.D.   On: 08/18/2016 21:37    Procedures Procedures (including critical care time)  Medications Ordered in ED Medications  sodium chloride 0.9 % bolus 1,000 mL (0 mLs Intravenous Stopped 08/18/16 2300)  ondansetron (ZOFRAN) injection 4 mg (4 mg Intravenous Given 08/18/16 1937)  fentaNYL (SUBLIMAZE) injection 12.5 mcg (12.5 mcg Intravenous Given 08/18/16 1938)  HYDROmorphone (DILAUDID) injection 0.5 mg (0.5 mg Intravenous Given 08/18/16 2030)  oxyCODONE-acetaminophen (PERCOCET/ROXICET) 5-325 MG per tablet 1 tablet (1 tablet Oral Given 08/18/16 2216)  iopamidol (ISOVUE-370) 76 % injection (100 mLs  Contrast Given 08/18/16 2101)  potassium chloride SA (K-DUR,KLOR-CON) CR tablet 20 mEq (20 mEq Oral Given 08/18/16 2216)     Initial Impression / Assessment and Plan / ED Course  I have reviewed the triage vital signs and the nursing notes.  Pertinent labs & imaging results that were available during my care of the patient were reviewed by me and considered in my medical decision making (see chart for details).  Clinical Course as of Aug 19 752  Wed Aug 18, 2016  1754 IMPRESSION: 1. Stable cardiomegaly. No active lung disease. No change in hyperaeration. 2. Somewhat mottled appearance of the bones diffusely may indicate osteopenia but changes of multiple myeloma cannot be excluded. DG Chest 2 View [CG]    Clinical Course User Index [CG] Kinnie Feil, PA-C   83 yo female with pertinent pmh of HTN, bony disease and anemia secondary to malignancy and active multiple myeloma currently not treated presents to ED bony-type pain diffusely over upper extremities, left lateral chest wall, flank and left hip exacerbated by active ROM.  No recent falls.  Patient reports mild nausea and diarrhea which  appears to be chronic and secondary to first round of Revlimid.  Patient stopped second round of Revlimid due to adverse effects in November, and has not followed up with oncologist for further discussion of alternative interventions for MM. On exam VS are reassuring except for SBP which has remained elevated.  Pt reports 9/10 pain which could be contributing to elevated blood pressure. RRR, lungs CTAB, abdomen benign.    Initial differential diagnoses includes bony mets causing pain, PE or ACS being less likely and patient denies exertional chest pain, shortness of breath, diaphoresis.  CXR showed mottled appearance in spine possibly osteopenia but MM unable to be excluded. ED lab work up so far unremarkable.  Troponin x 1, U/A, lactic acid, lipase, CMP, CBC reassuring.  Patient has been tolerating PO, has ambulated  to and from bathroom without assistance while in ED.   Patient and ED treatment was discussed with supervising physician who also evaluated the patient and is agreeable with plan so far.  Patient was handed off to on coming PA who will f/u on pending EKG,  CTA PE, repeat troponin.  If negative PE and patient's pain is well controlled with oral meds I suspect patient will be able to be discharged with percocet/anti-inflammatories, close f/u with oncologist and  close monitoring by family members.    Final Clinical Impressions(s) / ED Diagnoses   Final diagnoses:  Musculoskeletal pain    New Prescriptions Discharge Medication List as of 08/18/2016 10:48 PM    START taking these medications   Details  diclofenac sodium (VOLTAREN) 1 % GEL Apply 2 g topically 4 (four) times daily., Starting Wed 08/18/2016, Print    oxyCODONE-acetaminophen (PERCOCET) 5-325 MG tablet Take 1 tablet by mouth every 6 (six) hours as needed., Starting Wed 08/18/2016, Print         Kinnie Feil, PA-C 08/19/16 [place], MD 08/20/16 [phone]

## 2016-08-18 NOTE — ED Notes (Signed)
Patient transported to CT 

## 2016-08-18 NOTE — ED Provider Notes (Signed)
PROGRESS NOTE                                                                                                                 This is a sign-out from [place] at shift change: Kayla Brooks is a 83 y.o. female presenting with enteral shoulder pain worsening over the course of several weeks. Patient has active multiple myeloma, is currently not being treated. Vital signs stable except for elevated blood pressure, she is compliant with her blood pressure medications. Pending EKG, CTA, pain control.  Please refer to previous note for full HPI, ROS, PMH and PE.    Patient seen and evaluated the bedside, she is diffusely tender to palpation along the bilateral shoulders. States pain is worse with movement. Lung sounds are clear to auscultation, heart is regular rate and rhythm, abdominal exam is benign. She reports some improvement with Dilaudid, will give Percocet and reassess.  CTA negative, blood work and urinalysis reassuring. EKG nonischemic, initial troponin negative, will obtain delta troponin.  Patient reports significant improvement with Percocet. She states that she has family members who can stay with her we've had an extensive discussion about fall precautions. So had a frank discussion about the lack of follow-up with her oncologist and she states that she will make an appointment tomorrow.   This is a shared visit with the attending physician who personally evaluated the patient and agrees with the care plan.        Monico Blitz, PA-C 08/18/16 2251    [name], MD 08/18/16 [phone]

## 2016-08-18 NOTE — ED Notes (Signed)
The pt is c/o hurting all over for  1-2 months  Today the pain increased and she decided to get checked    Some intermittent generalized chest pain  No temp as far as she knows  Sl cough several weeks ago

## 2016-08-18 NOTE — Discharge Instructions (Signed)
It is very important that you see your oncologist, Dr. Alen Blew as soon as possible.  Take percocet for breakthrough pain, do not drink alcohol, drive, care for children or do other critical tasks while taking percocet.  Please be very careful not to fall! The pain medication puts you at risk for falls. Please rest as much as possible and try to not stay alone.   Please follow with your primary care doctor in the next 2 days for a check-up. They must obtain records for further management.   Do not hesitate to return to the Emergency Department for any new, worsening or concerning symptoms.

## 2016-08-18 NOTE — ED Notes (Signed)
One unsuccessful attempt to start iv 

## 2016-08-19 ENCOUNTER — Telehealth: Payer: Self-pay | Admitting: Oncology

## 2016-08-19 ENCOUNTER — Telehealth: Payer: Self-pay

## 2016-08-19 NOTE — Telephone Encounter (Signed)
sw pt to confirm lab/md appt 3/2 at 115 pm per LOS

## 2016-08-19 NOTE — Telephone Encounter (Signed)
Pt called to schedule appt. She no showed her appt in December. In basket sent routine status for lab and MD

## 2016-08-20 ENCOUNTER — Other Ambulatory Visit: Payer: Self-pay | Admitting: *Deleted

## 2016-08-20 ENCOUNTER — Other Ambulatory Visit (HOSPITAL_BASED_OUTPATIENT_CLINIC_OR_DEPARTMENT_OTHER): Payer: Medicare Other

## 2016-08-20 ENCOUNTER — Telehealth: Payer: Self-pay | Admitting: Oncology

## 2016-08-20 ENCOUNTER — Ambulatory Visit (HOSPITAL_BASED_OUTPATIENT_CLINIC_OR_DEPARTMENT_OTHER): Payer: Medicare Other | Admitting: Oncology

## 2016-08-20 VITALS — BP 160/64 | HR 65 | Temp 98.1°F | Resp 17 | Ht 65.0 in | Wt 106.1 lb

## 2016-08-20 DIAGNOSIS — R630 Anorexia: Secondary | ICD-10-CM

## 2016-08-20 DIAGNOSIS — D630 Anemia in neoplastic disease: Secondary | ICD-10-CM

## 2016-08-20 DIAGNOSIS — C9002 Multiple myeloma in relapse: Secondary | ICD-10-CM | POA: Diagnosis not present

## 2016-08-20 DIAGNOSIS — M899 Disorder of bone, unspecified: Secondary | ICD-10-CM

## 2016-08-20 LAB — URINE CULTURE

## 2016-08-20 LAB — COMPREHENSIVE METABOLIC PANEL
ALBUMIN: 3.4 g/dL — AB (ref 3.5–5.0)
ALK PHOS: 127 U/L (ref 40–150)
ALT: 15 U/L (ref 0–55)
ANION GAP: 10 meq/L (ref 3–11)
AST: 24 U/L (ref 5–34)
BUN: 14.5 mg/dL (ref 7.0–26.0)
CALCIUM: 9 mg/dL (ref 8.4–10.4)
CHLORIDE: 113 meq/L — AB (ref 98–109)
CO2: 23 mEq/L (ref 22–29)
Creatinine: 0.9 mg/dL (ref 0.6–1.1)
EGFR: 70 mL/min/{1.73_m2} — AB (ref >90)
Glucose: 100 mg/dl (ref 70–140)
POTASSIUM: 3.8 meq/L (ref 3.5–5.1)
Sodium: 146 mEq/L — ABNORMAL HIGH (ref 136–145)
Total Bilirubin: 0.66 mg/dL (ref 0.20–1.20)
Total Protein: 7.7 g/dL (ref 6.4–8.3)

## 2016-08-20 LAB — CBC WITH DIFFERENTIAL/PLATELET
BASO%: 0.3 % (ref 0.0–2.0)
BASOS ABS: 0 10*3/uL (ref 0.0–0.1)
EOS ABS: 0.1 10*3/uL (ref 0.0–0.5)
EOS%: 2.2 % (ref 0.0–7.0)
HEMATOCRIT: 25.8 % — AB (ref 34.8–46.6)
HEMOGLOBIN: 8.6 g/dL — AB (ref 11.6–15.9)
LYMPH#: 1.2 10*3/uL (ref 0.9–3.3)
LYMPH%: 27.6 % (ref 14.0–49.7)
MCH: 34.9 pg — AB (ref 25.1–34.0)
MCHC: 33.3 g/dL (ref 31.5–36.0)
MCV: 104.8 fL — AB (ref 79.5–101.0)
MONO#: 0.2 10*3/uL (ref 0.1–0.9)
MONO%: 3.8 % (ref 0.0–14.0)
NEUT#: 2.8 10*3/uL (ref 1.5–6.5)
NEUT%: 66.1 % (ref 38.4–76.8)
Platelets: 170 10*3/uL (ref 145–400)
RBC: 2.46 10*6/uL — ABNORMAL LOW (ref 3.70–5.45)
RDW: 13.3 % (ref 11.2–14.5)
WBC: 4.2 10*3/uL (ref 3.9–10.3)

## 2016-08-20 MED ORDER — HYDROCODONE-ACETAMINOPHEN 5-325 MG PO TABS: 1.0000 | ORAL_TABLET | Freq: Four times a day (QID) | ORAL | 0 refills | Status: DC | PRN

## 2016-08-20 NOTE — Progress Notes (Signed)
Hematology and Oncology Follow Up Visit  Kayla Brooks [id number] 1930-02-08 83 y.o. 08/20/2016 1:27 PM  CC: Kayla Ser, MD  Kayla Brooks. Kayla Blalock, MD, [facility], [place]  Kayla Brooks, M.D., Ph.D.    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  She had a light chain multiple myeloma with M spike of about 0.35 gm/dL.  Prior Therapy: 1. Status post prophylactic fixation impending pathological fracture of the right femur done in August 2009. 2. External beam radiation concluded in 2010 at the right femur. 3. Revlimid 25 mg daily as maintenance therapy for 2 years ending 03/2012.  Current therapy:  Revlimid 10 mg daily started in October 2017. Therapy discontinued in November 2018 due to poor tolerance.  Interim History:  [name] presents today for a followup visit. Since her last visit, she discontinued Revlimid altogether and have not taken any treatment except is a poor tolerance. She reported nausea and poor by mouth intake and she is not willing to take it anymore. She did reasonably well until this week when she started developing diffuse body pain predominately in the right shoulder and chest. She was seen in the emergency department and a CT scan of the chest did not show any evidence of acute pathology. She has been given Percocet which have helped her pain although it's making her extremely drowsy.  She has been doing reasonably well otherwise. She is ambulating with any falls or syncope. Her appetite is improved and have gained 2 pounds since last visit. She is not reporting any pathological fractures or recurrent infections.  She does not report any headaches, blurry vision or syncope. She does not report any fevers or chills or sweats.  she does not report any chest pain, palpitation, orthopnea or leg edema. She does not report any cough, wheezing or  hemoptysis. Her nausea have resolved at this time and denies any vomiting, abdominal pain or early satiety. She does not report any urinary symptoms of frequency urgency or hesitancy. Remainder of her review of systems unremarkable.   Medications: I have reviewed the patient's current medications.  Current Outpatient Prescriptions  Medication Sig Dispense Refill  . amLODipine (NORVASC) 5 MG tablet take 1 tablet by mouth once daily 90 tablet 3  . aspirin 81 MG tablet Take 1 tablet (81 mg total) by mouth daily. 31 tablet 11  . diclofenac sodium (VOLTAREN) 1 % GEL Apply 2 g topically 4 (four) times daily. 100 g 0  . ibuprofen (ADVIL,MOTRIN) 600 MG tablet Take 1 tablet (600 mg total) by mouth every 6 (six) hours as needed for moderate pain. 30 tablet 0  . lisinopril (PRINIVIL,ZESTRIL) 40 MG tablet Take 1 tablet (40 mg total) by mouth daily. 90 tablet 3  . omeprazole (PRILOSEC) 20 MG capsule take 1 capsule by mouth once daily 30 capsule 2  . oxybutynin (DITROPAN) 5 MG tablet take 1 tablet by mouth twice a day 60 tablet 1  . prochlorperazine (COMPAZINE) 10 MG tablet Take 1 tablet (10 mg total) by mouth every 6 (six) hours as needed for nausea or vomiting. 30 tablet 0  . simvastatin (ZOCOR) 20 MG tablet take 1 tablet by mouth at bedtime 30 tablet 5  . nitroGLYCERIN (NITROSTAT) 0.4 MG SL tablet Place 0.4 mg under the tongue every 5 (five) minutes as needed for chest pain.     No current facility-administered medications  for this visit.   , Allergies:  Allergies  Allergen Reactions  . Morphine And Related Other (See Comments)    Abdominal Pain    Past Medical History, Surgical history, Social history, and Family History were reviewed and updated.   Physical Exam: Blood pressure (!) 160/64, pulse 65, temperature 98.1 F (36.7 C), temperature source Oral, resp. rate 17, height _0  (1.651 m), weight 106 lb 1.6 oz (48.1 kg), SpO2 100 %. ECOG: 1 General appearance: Chronically ill-appearing  woman without distress today. Head: Normocephalic, without obvious abnormality. No oral thrush noted. Neck: no adenopathy Lymph nodes: Cervical, supraclavicular, and axillary nodes normal. Heart:regular rate and rhythm, S1, S2 normal, no murmur, click, rub or gallop Lung:chest clear, no wheezing, rales, normal symmetric air entry Abdomen: soft, non-tender, without masses or organomegaly no rebound or guarding. EXT:no evidence of edma.  Skin: No rash or lesions. Lab Results: Lab Results  Component Value Date   WBC 4.2 08/20/2016   HGB 8.6 (L) 08/20/2016   HCT 25.8 (L) 08/20/2016   MCV 104.8 (H) 08/20/2016   PLT 170 08/20/2016        Impression and Plan:  83 year old female with the following issues. 1. History of a plasmacytoma of the right femur.  She is status post surgical fixation followed by external beam radiation. No pain noted since the last visit. 2. Light chain multiple myeloma with disease predominantly in the bone marrow with 20% involvement. Her Kappa light chains continue to increase with increase ratio compared to lambda. She could not tolerate Revlimid given at 5 mg dosing. Options of therapy reviewed today which include Velcade based therapy versus supportive care only. She is not interested in receiving any anticancer treatment at this time and would like to proceed with supportive measures only. We'll discuss complications associated with this illness that includes worsening anemia, bone pain among others. We will continue with supportive care at this time. 3. Bony disease.  Continue to be on calcium and vitamin D supplements.  We will considering starting bisphosphonates and her disease had progressed. 4. Diffuse body pain: Prescription for hydrocodone will be given to the patient is sitting Percocet. 5. Anemia: Could be related due to progression of disease. She does not require any growth factor support or transfusion at this time. 6. Anorexia: Improved at this  time. 7. Follow up in 2 months for clinical visit.     Kayla Button, MD 3/2/20181:27 PM

## 2016-08-20 NOTE — Telephone Encounter (Signed)
Gave patient AVS and calender for May per 08/20/2016 los.

## 2016-08-23 LAB — KAPPA/LAMBDA LIGHT CHAINS: IG LAMBDA FREE LIGHT CHAIN: 3.1 mg/L — AB (ref 5.7–26.3)

## 2016-09-09 ENCOUNTER — Encounter: Payer: Self-pay | Admitting: Student in an Organized Health Care Education/Training Program

## 2016-10-12 ENCOUNTER — Other Ambulatory Visit: Payer: Self-pay | Admitting: Oncology

## 2016-10-22 ENCOUNTER — Ambulatory Visit: Payer: Self-pay | Admitting: Oncology

## 2016-10-22 ENCOUNTER — Other Ambulatory Visit: Payer: Self-pay

## 2016-10-29 ENCOUNTER — Ambulatory Visit (HOSPITAL_BASED_OUTPATIENT_CLINIC_OR_DEPARTMENT_OTHER): Payer: Medicare Other | Admitting: Oncology

## 2016-10-29 ENCOUNTER — Other Ambulatory Visit (HOSPITAL_BASED_OUTPATIENT_CLINIC_OR_DEPARTMENT_OTHER): Payer: Medicare Other

## 2016-10-29 ENCOUNTER — Other Ambulatory Visit: Payer: Self-pay | Admitting: *Deleted

## 2016-10-29 ENCOUNTER — Telehealth: Payer: Self-pay | Admitting: Oncology

## 2016-10-29 VITALS — BP 141/57 | HR 63 | Temp 98.3°F | Resp 20 | Ht 65.0 in | Wt 98.3 lb

## 2016-10-29 DIAGNOSIS — R634 Abnormal weight loss: Secondary | ICD-10-CM

## 2016-10-29 DIAGNOSIS — D630 Anemia in neoplastic disease: Secondary | ICD-10-CM

## 2016-10-29 DIAGNOSIS — C9002 Multiple myeloma in relapse: Secondary | ICD-10-CM

## 2016-10-29 DIAGNOSIS — G893 Neoplasm related pain (acute) (chronic): Secondary | ICD-10-CM

## 2016-10-29 DIAGNOSIS — M899 Disorder of bone, unspecified: Secondary | ICD-10-CM

## 2016-10-29 LAB — COMPREHENSIVE METABOLIC PANEL
ALT: 14 U/L (ref 0–55)
ANION GAP: 13 meq/L — AB (ref 3–11)
AST: 19 U/L (ref 5–34)
Albumin: 3.5 g/dL (ref 3.5–5.0)
Alkaline Phosphatase: 112 U/L (ref 40–150)
BUN: 31.9 mg/dL — ABNORMAL HIGH (ref 7.0–26.0)
CO2: 20 meq/L — AB (ref 22–29)
Calcium: 8.7 mg/dL (ref 8.4–10.4)
Chloride: 109 mEq/L (ref 98–109)
Creatinine: 1.7 mg/dL — ABNORMAL HIGH (ref 0.6–1.1)
EGFR: 32 mL/min/{1.73_m2} — AB (ref >90)
Glucose: 93 mg/dl (ref 70–140)
Potassium: 3.4 mEq/L — ABNORMAL LOW (ref 3.5–5.1)
Sodium: 142 mEq/L (ref 136–145)
Total Bilirubin: 0.8 mg/dL (ref 0.20–1.20)
Total Protein: 8.4 g/dL — ABNORMAL HIGH (ref 6.4–8.3)

## 2016-10-29 LAB — CBC WITH DIFFERENTIAL/PLATELET
BASO%: 0.2 % (ref 0.0–2.0)
Basophils Absolute: 0 10*3/uL (ref 0.0–0.1)
EOS ABS: 0.1 10*3/uL (ref 0.0–0.5)
EOS%: 1.7 % (ref 0.0–7.0)
HCT: 26 % — ABNORMAL LOW (ref 34.8–46.6)
HEMOGLOBIN: 8.5 g/dL — AB (ref 11.6–15.9)
LYMPH#: 1.8 10*3/uL (ref 0.9–3.3)
LYMPH%: 37.7 % (ref 14.0–49.7)
MCH: 32 pg (ref 25.1–34.0)
MCHC: 32.7 g/dL (ref 31.5–36.0)
MCV: 97.7 fL (ref 79.5–101.0)
MONO#: 0.2 10*3/uL (ref 0.1–0.9)
MONO%: 3.6 % (ref 0.0–14.0)
NEUT%: 56.8 % (ref 38.4–76.8)
NEUTROS ABS: 2.7 10*3/uL (ref 1.5–6.5)
PLATELETS: 142 10*3/uL — AB (ref 145–400)
RBC: 2.66 10*6/uL — AB (ref 3.70–5.45)
RDW: 13.4 % (ref 11.2–14.5)
WBC: 4.7 10*3/uL (ref 3.9–10.3)

## 2016-10-29 MED ORDER — HYDROCODONE-ACETAMINOPHEN 5-325 MG PO TABS
1.0000 | ORAL_TABLET | Freq: Four times a day (QID) | ORAL | 0 refills | Status: DC | PRN
Start: 2016-10-29 — End: 2017-01-07

## 2016-10-29 NOTE — Telephone Encounter (Signed)
Appointments scheduled per 10/29/16 los. Patient was given a copy of the AVS report and appointment schedule, per 10/29/16 los.

## 2016-10-29 NOTE — Progress Notes (Signed)
Hematology and Oncology Follow Up Visit  Kayla Brooks [id number] 06/01/1930 83 y.o. 10/29/2016 1:00 PM  CC: Kayla Ser, MD  Kayla Brooks. Kayla Blalock, MD, [facility]  [name], M.D., Ph.D.    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  She had a light chain multiple myeloma with M spike of about 0.35 gm/dL.  Prior Therapy: 1. Status post prophylactic fixation impending pathological fracture of the right femur done in August 2009. 2. External beam radiation concluded in 2010 at the right femur. 3. Revlimid 25 mg daily as maintenance therapy for 2 years ending 03/2012.  Current therapy:   Revlimid 10 mg daily started in October 2017. Therapy discontinued in November 2018 due to poor tolerance. She is currently on supportive care only.  Interim History:  Kayla Brooks presents today for a followup visit. Since her last visit, she reports no major changes. She is reporting more fatigue and tiredness but still able to function reasonably well. He still ambulates without any falls or syncope. She continues to use public transportation if needed to for her appointments. She is noticing that her exercise tolerance is declining. Her appetite remains excellent although she continues to lose weight. She does report periodic hip pain and shoulder pain and takes hydrocodone 4.  She does not report any headaches, blurry vision or syncope. She does not report any fevers or chills or sweats.  she does not report any chest pain, palpitation, orthopnea or leg edema. She does not report any cough, wheezing or hemoptysis. Her nausea have resolved at this time and denies any vomiting, abdominal pain or early satiety. She does not report any urinary symptoms of frequency urgency or hesitancy. Remainder of her review of systems unremarkable.   Medications: I have reviewed the  patient's current medications.  Current Outpatient Prescriptions  Medication Sig Dispense Refill  . aspirin 81 MG tablet Take 1 tablet (81 mg total) by mouth daily. 31 tablet 11  . diclofenac sodium (VOLTAREN) 1 % GEL Apply 2 g topically 4 (four) times daily. 100 g 0  . ibuprofen (ADVIL,MOTRIN) 600 MG tablet Take 1 tablet (600 mg total) by mouth every 6 (six) hours as needed for moderate pain. 30 tablet 0  . lisinopril (PRINIVIL,ZESTRIL) 40 MG tablet Take 1 tablet (40 mg total) by mouth daily. 90 tablet 3  . nitroGLYCERIN (NITROSTAT) 0.4 MG SL tablet Place 0.4 mg under the tongue every 5 (five) minutes as needed for chest pain.    Marland Kitchen omeprazole (PRILOSEC) 20 MG capsule take 1 capsule by mouth once daily 30 capsule 2  . oxybutynin (DITROPAN) 5 MG tablet take 1 tablet by mouth twice a day 60 tablet 1  . prochlorperazine (COMPAZINE) 10 MG tablet Take 1 tablet (10 mg total) by mouth every 6 (six) hours as needed for nausea or vomiting. 30 tablet 0  . simvastatin (ZOCOR) 20 MG tablet take 1 tablet by mouth at bedtime 30 tablet 5  . amLODipine (NORVASC) 5 MG tablet take 1 tablet by mouth once daily 90 tablet 3  . HYDROcodone-acetaminophen (NORCO/VICODIN) 5-325 MG tablet Take 1 tablet by mouth every 6 (six) hours as needed for moderate pain. 30 tablet 0   No current facility-administered medications for this visit.   , Allergies:  Allergies  Allergen Reactions  . Morphine And Related Other (See Comments)    Abdominal Pain  Past Medical History, Surgical history, Social history, and Family History were reviewed and updated.   Physical Exam: Blood pressure (!) 141/57, pulse 63, temperature 98.3 F (36.8 C), temperature source Oral, resp. rate 20, height '5\' 5"'  (1.651 m), weight 98 lb 4.8 oz (44.6 kg), SpO2 100 %. ECOG: 1 General appearance: Frail elderly woman appeared without distress. Head: Normocephalic, without obvious abnormality. No oral thrush or ulcers. Neck: no adenopathy Lymph  nodes: Cervical, supraclavicular, and axillary nodes normal. Heart:regular rate and rhythm, S1, S2 normal, no murmur, click, rub or gallop Lung:chest clear, no wheezing, rales, normal symmetric air entry Abdomen: soft, non-tender, without masses or organomegaly no shifting dullness or ascites. EXT:no evidence of edma.  Skin: No rash or lesions. Lab Results: Lab Results  Component Value Date   WBC 4.7 10/29/2016   HGB 8.5 (L) 10/29/2016   HCT 26.0 (L) 10/29/2016   MCV 97.7 10/29/2016   PLT 142 (L) 10/29/2016        Impression and Plan:  82 year old female with the following issues. 1. History of a plasmacytoma of the right femur.  She is status post surgical fixation followed by external beam radiation. No pain noted since the last visit. 2. Light chain multiple myeloma with disease predominantly in the bone marrow with 20% involvement. Her Kappa light chains continue to increase with increase ratio compared to lambda. She could not tolerate Revlimid given at 5 mg dosing. Options of therapy were reviewed again and at this time she declined systemic therapy. We will continue with supportive management at this time and address her symptoms as they arise. 3. Diffuse body pain: Prescription for hydrocodone will be given to the patient. 4. Anemia: Could be related due to progression of disease. We have discussed the risks and benefits of packed red cell transfusion and given the fact that she is mildly symptomatic and will set up for transfusion in the next couple weeks. 5. Prognosis: She does have an incurable malignancy that is evolving at this time. She will likely experience further deterioration in her health and future and will likely require hospice. 6. Follow up in 2 months for clinical visit.     Zola Button, MD 5/11/20181:00 PM

## 2016-10-31 ENCOUNTER — Telehealth: Payer: Self-pay | Admitting: Student in an Organized Health Care Education/Training Program

## 2016-10-31 DIAGNOSIS — I10 Essential (primary) hypertension: Secondary | ICD-10-CM

## 2016-11-04 NOTE — Telephone Encounter (Signed)
Dr. Evette Doffing will not have any openings until July and his schedule is not available in EPIC.Marland Kitchen

## 2016-11-05 NOTE — Telephone Encounter (Signed)
July will be fine as he has enough refills to last until then, pt also has multiple upcoming appts with oncologist. Please schedule with pcp once schedule is available.Despina Hidden Cassady5/18/20181:52 PM

## 2016-11-12 ENCOUNTER — Other Ambulatory Visit: Payer: Self-pay

## 2016-11-17 ENCOUNTER — Telehealth: Payer: Self-pay | Admitting: Oncology

## 2016-11-17 NOTE — Telephone Encounter (Signed)
Spoke with patient re lab/PRBC's 6/1. Appointment scheduled per 5/25 schedule message. Infusion at capacity this week - per nurse Friday ok for blood.

## 2016-11-19 ENCOUNTER — Ambulatory Visit (HOSPITAL_BASED_OUTPATIENT_CLINIC_OR_DEPARTMENT_OTHER): Payer: Medicare Other

## 2016-11-19 ENCOUNTER — Ambulatory Visit (HOSPITAL_COMMUNITY)
Admission: RE | Admit: 2016-11-19 | Discharge: 2016-11-19 | Disposition: A | Discharge status: Home / Self Care | Payer: Medicare Other | Source: Ambulatory Visit | Attending: Oncology | Admitting: Oncology

## 2016-11-19 ENCOUNTER — Other Ambulatory Visit (HOSPITAL_BASED_OUTPATIENT_CLINIC_OR_DEPARTMENT_OTHER): Payer: Medicare Other

## 2016-11-19 DIAGNOSIS — C9002 Multiple myeloma in relapse: Secondary | ICD-10-CM | POA: Diagnosis not present

## 2016-11-19 LAB — CBC WITH DIFFERENTIAL/PLATELET
BASO%: 0.5 % (ref 0.0–2.0)
Basophils Absolute: 0 10*3/uL (ref 0.0–0.1)
EOS%: 1.4 % (ref 0.0–7.0)
Eosinophils Absolute: 0.1 10*3/uL (ref 0.0–0.5)
HEMATOCRIT: 26.1 % — AB (ref 34.8–46.6)
HGB: 8.7 g/dL — ABNORMAL LOW (ref 11.6–15.9)
LYMPH%: 21.1 % (ref 14.0–49.7)
MCH: 33.1 pg (ref 25.1–34.0)
MCHC: 33.3 g/dL (ref 31.5–36.0)
MCV: 99.6 fL (ref 79.5–101.0)
MONO#: 0.2 10*3/uL (ref 0.1–0.9)
MONO%: 3.6 % (ref 0.0–14.0)
NEUT%: 73.4 % (ref 38.4–76.8)
NEUTROS ABS: 4.6 10*3/uL (ref 1.5–6.5)
PLATELETS: 203 10*3/uL (ref 145–400)
RBC: 2.62 10*6/uL — AB (ref 3.70–5.45)
RDW: 14.3 % (ref 11.2–14.5)
WBC: 6.2 10*3/uL (ref 3.9–10.3)
lymph#: 1.3 10*3/uL (ref 0.9–3.3)

## 2016-12-17 ENCOUNTER — Ambulatory Visit: Payer: Self-pay | Admitting: Oncology

## 2016-12-17 ENCOUNTER — Other Ambulatory Visit: Payer: Self-pay

## 2017-01-06 ENCOUNTER — Telehealth: Payer: Self-pay | Admitting: *Deleted

## 2017-01-06 NOTE — Telephone Encounter (Signed)
"  Can I have an appointment to see Dr. Alen Blew next week?   No problems other than pain.  I have pain when I don't take the hydrocodone 5-325.  I ran out over a month ago.  (Last order was 10-29-2016 for 1 q 6 hrs, quantity:30)  Pain has not changed.  Left hip surgery but I hurt over my right hip area and lower back.  I'm not good about taking medications and not good at keeping up with refills or getting things dome.  I've not been seen and know I need appointment.  (Missed recent appointments for lab, transfusion, one MD  F/U)   Let him know had no idea appointments were already scheduled so I was not aware of any missed appointments.  (Confirmed land line phone number per EPIC)  Ask him to send hydrocodone refill because I'm out.  (Hydrocodone prescription must be picked up to take to the pharmacy)  I do not drive.  My son is off on Wednesday's.  He can bring me next Wednesday.  I guess I'll have to pick up prescription at that appointment.  I can try Tylenol because Motrin makes me nauseated.  Return number [phone].  No, I've never used [facility] medical transportation."

## 2017-01-07 ENCOUNTER — Telehealth: Payer: Self-pay | Admitting: Oncology

## 2017-01-07 ENCOUNTER — Other Ambulatory Visit: Payer: Self-pay | Admitting: *Deleted

## 2017-01-07 ENCOUNTER — Telehealth: Payer: Self-pay | Admitting: *Deleted

## 2017-01-07 MED ORDER — HYDROCODONE-ACETAMINOPHEN 5-325 MG PO TABS: 1.0000 | ORAL_TABLET | Freq: Four times a day (QID) | ORAL | 0 refills | Status: DC | PRN

## 2017-01-07 NOTE — Telephone Encounter (Signed)
Please refill her medication today. I will send a note to get her scheduled ASAP.

## 2017-01-07 NOTE — Telephone Encounter (Signed)
Scheduled appt per 7/20 sch message from Dr. Alen Blew - patient is aware of appt time and date.

## 2017-01-07 NOTE — Telephone Encounter (Signed)
Spoke with patient, she is unable to p/u script today, will come to infusion room on Saturday July 21st.

## 2017-01-11 ENCOUNTER — Inpatient Hospital Stay (HOSPITAL_COMMUNITY)
Admission: EM | Admit: 2017-01-11 | Discharge: 2017-01-19 | DRG: 840 | Disposition: A | Discharge status: Home Health | Payer: Medicare Other | Attending: Internal Medicine | Admitting: Internal Medicine

## 2017-01-11 ENCOUNTER — Encounter (HOSPITAL_COMMUNITY): Payer: Self-pay

## 2017-01-11 DIAGNOSIS — K219 Gastro-esophageal reflux disease without esophagitis: Secondary | ICD-10-CM | POA: Diagnosis not present

## 2017-01-11 DIAGNOSIS — R64 Cachexia: Secondary | ICD-10-CM | POA: Diagnosis not present

## 2017-01-11 DIAGNOSIS — R51 Headache: Secondary | ICD-10-CM | POA: Diagnosis not present

## 2017-01-11 DIAGNOSIS — R1030 Lower abdominal pain, unspecified: Secondary | ICD-10-CM

## 2017-01-11 DIAGNOSIS — I10 Essential (primary) hypertension: Secondary | ICD-10-CM | POA: Diagnosis not present

## 2017-01-11 DIAGNOSIS — Z923 Personal history of irradiation: Secondary | ICD-10-CM | POA: Diagnosis not present

## 2017-01-11 DIAGNOSIS — E785 Hyperlipidemia, unspecified: Secondary | ICD-10-CM | POA: Diagnosis present

## 2017-01-11 DIAGNOSIS — C7951 Secondary malignant neoplasm of bone: Secondary | ICD-10-CM

## 2017-01-11 DIAGNOSIS — C9002 Multiple myeloma in relapse: Secondary | ICD-10-CM

## 2017-01-11 DIAGNOSIS — Z79899 Other long term (current) drug therapy: Secondary | ICD-10-CM | POA: Diagnosis not present

## 2017-01-11 DIAGNOSIS — Z7982 Long term (current) use of aspirin: Secondary | ICD-10-CM | POA: Diagnosis not present

## 2017-01-11 DIAGNOSIS — E872 Acidosis: Secondary | ICD-10-CM | POA: Diagnosis present

## 2017-01-11 DIAGNOSIS — K8020 Calculus of gallbladder without cholecystitis without obstruction: Secondary | ICD-10-CM | POA: Diagnosis present

## 2017-01-11 DIAGNOSIS — N179 Acute kidney failure, unspecified: Secondary | ICD-10-CM | POA: Diagnosis not present

## 2017-01-11 DIAGNOSIS — Z885 Allergy status to narcotic agent status: Secondary | ICD-10-CM | POA: Diagnosis not present

## 2017-01-11 DIAGNOSIS — D649 Anemia, unspecified: Secondary | ICD-10-CM | POA: Diagnosis present

## 2017-01-11 DIAGNOSIS — C9000 Multiple myeloma not having achieved remission: Principal | ICD-10-CM | POA: Diagnosis present

## 2017-01-11 DIAGNOSIS — Z9221 Personal history of antineoplastic chemotherapy: Secondary | ICD-10-CM | POA: Diagnosis not present

## 2017-01-11 DIAGNOSIS — Z515 Encounter for palliative care: Secondary | ICD-10-CM | POA: Diagnosis not present

## 2017-01-11 DIAGNOSIS — N183 Chronic kidney disease, stage 3 unspecified: Secondary | ICD-10-CM | POA: Diagnosis present

## 2017-01-11 DIAGNOSIS — Z87891 Personal history of nicotine dependence: Secondary | ICD-10-CM

## 2017-01-11 DIAGNOSIS — N170 Acute kidney failure with tubular necrosis: Secondary | ICD-10-CM | POA: Diagnosis not present

## 2017-01-11 DIAGNOSIS — R109 Unspecified abdominal pain: Secondary | ICD-10-CM

## 2017-01-11 DIAGNOSIS — E43 Unspecified severe protein-calorie malnutrition: Secondary | ICD-10-CM | POA: Diagnosis not present

## 2017-01-11 DIAGNOSIS — H538 Other visual disturbances: Secondary | ICD-10-CM | POA: Diagnosis not present

## 2017-01-11 DIAGNOSIS — Z7189 Other specified counseling: Secondary | ICD-10-CM

## 2017-01-11 DIAGNOSIS — Z90710 Acquired absence of both cervix and uterus: Secondary | ICD-10-CM

## 2017-01-11 DIAGNOSIS — R1013 Epigastric pain: Secondary | ICD-10-CM | POA: Diagnosis not present

## 2017-01-11 DIAGNOSIS — K529 Noninfective gastroenteritis and colitis, unspecified: Secondary | ICD-10-CM | POA: Diagnosis not present

## 2017-01-11 DIAGNOSIS — G893 Neoplasm related pain (acute) (chronic): Secondary | ICD-10-CM | POA: Diagnosis present

## 2017-01-11 DIAGNOSIS — R519 Headache, unspecified: Secondary | ICD-10-CM

## 2017-01-11 DIAGNOSIS — Z681 Body mass index (BMI) 19 or less, adult: Secondary | ICD-10-CM

## 2017-01-11 LAB — LIPASE, BLOOD: Lipase: 21 U/L (ref 11–51)

## 2017-01-11 LAB — COMPREHENSIVE METABOLIC PANEL
ALK PHOS: 71 U/L (ref 38–126)
ALT: 13 U/L — ABNORMAL LOW (ref 14–54)
ANION GAP: 12 (ref 5–15)
AST: 22 U/L (ref 15–41)
Albumin: 3.3 g/dL — ABNORMAL LOW (ref 3.5–5.0)
BUN: 56 mg/dL — ABNORMAL HIGH (ref 6–20)
CALCIUM: 9.6 mg/dL (ref 8.9–10.3)
CO2: 14 mmol/L — AB (ref 22–32)
Chloride: 113 mmol/L — ABNORMAL HIGH (ref 101–111)
Creatinine, Ser: 2.75 mg/dL — ABNORMAL HIGH (ref 0.44–1.00)
GFR calc non Af Amer: 15 mL/min — ABNORMAL LOW (ref >60)
GFR, EST AFRICAN AMERICAN: 17 mL/min — AB (ref >60)
Glucose, Bld: 103 mg/dL — ABNORMAL HIGH (ref 65–99)
Potassium: 4.2 mmol/L (ref 3.5–5.1)
SODIUM: 139 mmol/L (ref 135–145)
TOTAL PROTEIN: 9.2 g/dL — AB (ref 6.5–8.1)
Total Bilirubin: 0.7 mg/dL (ref 0.3–1.2)

## 2017-01-11 LAB — CBC
HCT: 21.1 % — ABNORMAL LOW (ref 36.0–46.0)
HEMOGLOBIN: 6.8 g/dL — AB (ref 12.0–15.0)
MCH: 32.1 pg (ref 26.0–34.0)
MCHC: 32.2 g/dL (ref 30.0–36.0)
MCV: 99.5 fL (ref 78.0–100.0)
Platelets: 169 10*3/uL (ref 150–400)
RBC: 2.12 MIL/uL — AB (ref 3.87–5.11)
RDW: 16 % — ABNORMAL HIGH (ref 11.5–15.5)
WBC: 3.7 10*3/uL — ABNORMAL LOW (ref 4.0–10.5)

## 2017-01-11 NOTE — ED Notes (Signed)
Hgb 6.8 reported to Dr. Winfred Leeds.

## 2017-01-11 NOTE — ED Notes (Addendum)
Pt son approached this RN to provide additional information about his mother. He reports pt oldest son is in hospital and not expected to live overnight and that has been putting a lot of stress on her. HE also reports she was in remission for cancer for about 7 years but it returned a while ago and has been causing her abdominal pain and vaginal pain. Pt son also reports she has increased swelling in her feet

## 2017-01-11 NOTE — ED Triage Notes (Signed)
PT arrived to ED with complaints of pelvic and left groin pain x 1 week. 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

## 2017-01-11 NOTE — ED Provider Notes (Signed)
[facility] DEPT Provider Note   CSN: [id number] Arrival date & time: 01/11/17  2113 By signing my name below, I, Georgette Shell, attest that this documentation has been prepared under the direction and in the presence of Delora Fuel, MD. Electronically Signed: Georgette Shell, ED Scribe. 01/11/17. 12:37 AM.  History   Chief Complaint Chief Complaint  Patient presents with  . Vaginal Pain    HPI The history is provided by the patient and a relative. No language interpreter was used.   HPI Comments: Kayla Brooks is a 83 y.o. 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 Pt also has associated intermittent nausea and diarrhea. Daughter at bedside reports that pt has been undergoing a lot of stress recently because oldest son is in hospital and not expected to live overnight; she is concerned stress could have caused pt's current symptoms. Pt denies fever, chills, constipation, vomiting, dysuria, trouble urinating, or any other associated symptoms.   Per son at bedside, pt's feet are also more swollen than usual. Pt reports she has soaked them in epsom salt with no relief.  Per chart review, pt's multiple myeloma was treated with one cycle of Revlimid - the second cycle was later discontinued in November 2017 secondary to adverse effects of nausea, anorexia, and diarrhea.  PCP: Axel Filler, MD Oncologist: Zola Button, MD  Past Medical History:  Diagnosis Date  . Arthritis    "right knee" (06/10/2015)  . Chronic anemia   . Chronic diarrhea    Archie Endo 06/10/2015  . GERD (gastroesophageal reflux disease)   . Hyperlipidemia   . Hypertension   . Monoclonal paraproteinemia   . Multiple myeloma without mention  of remission   . Plasmacytoma of bone ([place])    right femur/notes 06/04/2015  . Pneumonia    "years ago" (06/10/2015)  . Urinary frequency     Patient Active Problem List   Diagnosis Date Noted  . Healthcare maintenance 01/02/2015  . Urge incontinence 12/11/2014  . Multiple myeloma ([place]) 03/27/2009  . Essential hypertension 05/04/2006  . GERD (gastroesophageal reflux disease) 05/04/2006    Past Surgical History:  Procedure Laterality Date  . BONE MARROW BIOPSY     "for multiple myeloma"  . CATARACT EXTRACTION Right   . FEMUR SURGERY Right 01/2008   1.  prophylactic fixation impending pathological fracture of the femur/notes 06/04/2015  . FRACTURE SURGERY    . VAGINAL HYSTERECTOMY     "partial"    OB History    No data available       Home Medications    Prior to Admission medications   Medication Sig Start Date End Date Taking? Authorizing Provider  amLODipine (NORVASC) 5 MG tablet take 1 tablet by mouth once daily 07/12/16   Axel Filler, MD  aspirin 81 MG tablet Take 1 tablet (81 mg total) by mouth daily. 09/08/12   Trish Fountain, MD  diclofenac sodium (VOLTAREN) 1 % GEL Apply 2 g topically 4 (four) times daily. 08/18/16   Pisciotta, Elmyra Ricks, PA-C  HYDROcodone-acetaminophen (NORCO/VICODIN) 5-325 MG tablet Take 1 tablet by mouth every 6 (six) hours as needed for moderate pain. 01/07/17   Wyatt Portela, MD  ibuprofen (ADVIL,MOTRIN) 600 MG tablet Take 1 tablet (600 mg total) by mouth every 6 (  six) hours as needed for moderate pain. 06/11/15   Maryellen Pile, MD  lisinopril (PRINIVIL,ZESTRIL) 40 MG tablet take 1 tablet by mouth once daily 11/01/16   Axel Filler, MD  nitroGLYCERIN (NITROSTAT) 0.4 MG SL tablet Place 0.4 mg under the tongue every 5 (five) minutes as needed for chest pain.    [provider]  omeprazole (PRILOSEC) 20 MG capsule take 1 capsule by mouth once daily 12/05/15   Axel Filler, MD  oxybutynin [facility]) 5 MG  tablet take 1 tablet by mouth twice a day 04/06/16   Axel Filler, MD  prochlorperazine (COMPAZINE) 10 MG tablet Take 1 tablet (10 mg total) by mouth every 6 (six) hours as needed for nausea or vomiting. 05/05/16   Wyatt Portela, MD  simvastatin (ZOCOR) 20 MG tablet take 1 tablet by mouth at bedtime 04/10/15   Corky Sox, MD    Family History No family history on file.  Social History Social History  Substance Use Topics  . Smoking status: Former Smoker    Packs/day: 2.00    Years: 40.00    Types: Cigarettes  . Smokeless tobacco: Never Used     Comment: "quit smoking years ago"  . Alcohol use 0.0 oz/week     Comment: 06/10/2015 "drank socially in my younger days"     Allergies   Morphine and related   Review of Systems Review of Systems  Constitutional: Negative for chills and fever.  Gastrointestinal: Positive for abdominal pain, diarrhea and nausea. Negative for constipation and vomiting.  Genitourinary: Negative for difficulty urinating and dysuria.  Musculoskeletal: Positive for joint swelling.  All other systems reviewed and are negative.    Physical Exam Updated Vital Signs BP (!) 179/68 (BP Location: Right Arm)   Pulse 77   Temp 98.9 F (37.2 C) (Oral)   Resp 18   SpO2 100% Comment: left thumb  Physical Exam  Constitutional: She is oriented to person, place, and time. She appears well-developed and well-nourished.  HENT:  Head: Normocephalic and atraumatic.  Eyes: Pupils are equal, round, and reactive to light. EOM are normal.  Neck: Normal range of motion. Neck supple. No JVD present.  Cardiovascular:  No murmur heard. Occasional premature beats.  Pulmonary/Chest: Effort normal and breath sounds normal. She has no wheezes. She has no rales. She exhibits no tenderness.  Abdominal: Soft. Bowel sounds are normal. She exhibits abdominal bruit. She exhibits no distension and no mass. There is tenderness.  Mild suprapubic tenderness. Bruit  heard in epigastric area.  Genitourinary:  Genitourinary Comments: Normal sphincter tone. Light brown stool present.  Musculoskeletal: Normal range of motion. She exhibits edema.  2+ pre-tibial and pedal edema.  Lymphadenopathy:    She has no cervical adenopathy.  Neurological: She is alert and oriented to person, place, and time. No cranial nerve deficit. She exhibits normal muscle tone. Coordination normal.  Skin: Skin is warm and dry. No rash noted.  Psychiatric: She has a normal mood and affect. Her behavior is normal. Judgment and thought content normal.  Nursing note and vitals reviewed.    ED Treatments / Results  DIAGNOSTIC STUDIES: Oxygen Saturation is 100% on RA, normal by my interpretation.   COORDINATION OF CARE: 12:37 AM-Discussed next steps with pt. Pt verbalized understanding and is agreeable with the plan.   Labs (all labs ordered are listed, but only abnormal results are displayed) Labs Reviewed  COMPREHENSIVE METABOLIC PANEL - Abnormal; Notable for the following:  Result Value   Chloride 113 (*)    CO2 14 (*)    Glucose, Bld 103 (*)    BUN 56 (*)    Creatinine, Ser 2.75 (*)    Total Protein 9.2 (*)    Albumin 3.3 (*)    ALT 13 (*)    GFR calc non Af Amer 15 (*)    GFR calc Af Amer 17 (*)    All other components within normal limits  CBC - Abnormal; Notable for the following:    WBC 3.7 (*)    RBC 2.12 (*)    Hemoglobin 6.8 (*)    HCT 21.1 (*)    RDW 16.0 (*)    All other components within normal limits  URINALYSIS, ROUTINE W REFLEX MICROSCOPIC - Abnormal; Notable for the following:    Hgb urine dipstick SMALL (*)    Protein, ur 100 (*)    Leukocytes, UA SMALL (*)    Bacteria, UA RARE (*)    All other components within normal limits  LIPASE, BLOOD  POC OCCULT BLOOD, ED  TYPE AND SCREEN  PREPARE RBC (CROSSMATCH)    Procedures Procedures (including critical care time) CRITICAL CARE Performed by: QPRFF,MBWGY Total critical care time: 40  minutes Critical care time was exclusive of separately billable procedures and treating other patients. Critical care was necessary to treat or prevent imminent or life-threatening deterioration. Critical care was time spent personally by me on the following activities: development of treatment plan with patient and/or surrogate as well as nursing, discussions with consultants, evaluation of patient's response to treatment, examination of patient, obtaining history from patient or surrogate, ordering and performing treatments and interventions, ordering and review of laboratory studies, ordering and review of radiographic studies, pulse oximetry and re-evaluation of patient's condition.  Medications Ordered in ED Medications  0.9 %  sodium chloride infusion (not administered)     Initial Impression / Assessment and Plan / ED Course  I have reviewed the triage vital signs and the nursing notes.  Pertinent lab results that were available during my care of the patient were reviewed by me and considered in my medical decision making (see chart for details).  Abdominal discomfort of uncertain cause. Multiple myeloma which is currently not being treated. Old records are reviewed confirming multiple myeloma with chemotherapy having been withdrawn due to intolerance. Laboratory evaluation shows a 2 g drop in hemoglobin over the last 7 weeks, progressive increase in BUN/creatinine over the last 4 months. Because of drop in hemoglobin, rectal was done showing no blood, and stool was Hemoccult negative. Blood transfusion is ordered. Case is discussed with Dr. Hal Hope of [facility] who agrees to admit the patient.  Final Clinical Impressions(s) / ED Diagnoses   Final diagnoses:  None    New Prescriptions New Prescriptions   No medications on file   I personally performed the services described in this documentation, which was scribed in my presence. The recorded information has been  reviewed and is accurate.       Delora Fuel, MD 65/99/35 [phone]

## 2017-01-12 ENCOUNTER — Observation Stay (HOSPITAL_COMMUNITY): Payer: Medicare Other

## 2017-01-12 ENCOUNTER — Encounter (HOSPITAL_COMMUNITY): Payer: Self-pay | Admitting: Internal Medicine

## 2017-01-12 DIAGNOSIS — Z79899 Other long term (current) drug therapy: Secondary | ICD-10-CM | POA: Diagnosis not present

## 2017-01-12 DIAGNOSIS — E872 Acidosis: Secondary | ICD-10-CM | POA: Diagnosis present

## 2017-01-12 DIAGNOSIS — N183 Chronic kidney disease, stage 3 unspecified: Secondary | ICD-10-CM | POA: Diagnosis present

## 2017-01-12 DIAGNOSIS — Z923 Personal history of irradiation: Secondary | ICD-10-CM | POA: Diagnosis not present

## 2017-01-12 DIAGNOSIS — C9002 Multiple myeloma in relapse: Secondary | ICD-10-CM | POA: Diagnosis not present

## 2017-01-12 DIAGNOSIS — H538 Other visual disturbances: Secondary | ICD-10-CM | POA: Diagnosis present

## 2017-01-12 DIAGNOSIS — I10 Essential (primary) hypertension: Secondary | ICD-10-CM | POA: Diagnosis not present

## 2017-01-12 DIAGNOSIS — R51 Headache: Secondary | ICD-10-CM | POA: Diagnosis not present

## 2017-01-12 DIAGNOSIS — C7951 Secondary malignant neoplasm of bone: Secondary | ICD-10-CM | POA: Diagnosis present

## 2017-01-12 DIAGNOSIS — K529 Noninfective gastroenteritis and colitis, unspecified: Secondary | ICD-10-CM | POA: Diagnosis present

## 2017-01-12 DIAGNOSIS — E785 Hyperlipidemia, unspecified: Secondary | ICD-10-CM | POA: Diagnosis present

## 2017-01-12 DIAGNOSIS — N179 Acute kidney failure, unspecified: Secondary | ICD-10-CM | POA: Insufficient documentation

## 2017-01-12 DIAGNOSIS — D649 Anemia, unspecified: Secondary | ICD-10-CM | POA: Diagnosis not present

## 2017-01-12 DIAGNOSIS — R64 Cachexia: Secondary | ICD-10-CM | POA: Diagnosis present

## 2017-01-12 DIAGNOSIS — C9000 Multiple myeloma not having achieved remission: Secondary | ICD-10-CM | POA: Diagnosis present

## 2017-01-12 DIAGNOSIS — Z87891 Personal history of nicotine dependence: Secondary | ICD-10-CM | POA: Diagnosis not present

## 2017-01-12 DIAGNOSIS — Z681 Body mass index (BMI) 19 or less, adult: Secondary | ICD-10-CM | POA: Diagnosis not present

## 2017-01-12 DIAGNOSIS — R609 Edema, unspecified: Secondary | ICD-10-CM | POA: Diagnosis not present

## 2017-01-12 DIAGNOSIS — R1013 Epigastric pain: Secondary | ICD-10-CM | POA: Diagnosis not present

## 2017-01-12 DIAGNOSIS — R109 Unspecified abdominal pain: Secondary | ICD-10-CM

## 2017-01-12 DIAGNOSIS — E43 Unspecified severe protein-calorie malnutrition: Secondary | ICD-10-CM | POA: Diagnosis present

## 2017-01-12 DIAGNOSIS — Z90710 Acquired absence of both cervix and uterus: Secondary | ICD-10-CM | POA: Diagnosis not present

## 2017-01-12 DIAGNOSIS — Z885 Allergy status to narcotic agent status: Secondary | ICD-10-CM | POA: Diagnosis not present

## 2017-01-12 DIAGNOSIS — Z9221 Personal history of antineoplastic chemotherapy: Secondary | ICD-10-CM | POA: Diagnosis not present

## 2017-01-12 DIAGNOSIS — G893 Neoplasm related pain (acute) (chronic): Secondary | ICD-10-CM | POA: Diagnosis present

## 2017-01-12 DIAGNOSIS — R1030 Lower abdominal pain, unspecified: Secondary | ICD-10-CM | POA: Diagnosis present

## 2017-01-12 DIAGNOSIS — Z7982 Long term (current) use of aspirin: Secondary | ICD-10-CM | POA: Diagnosis not present

## 2017-01-12 DIAGNOSIS — K8020 Calculus of gallbladder without cholecystitis without obstruction: Secondary | ICD-10-CM | POA: Diagnosis not present

## 2017-01-12 DIAGNOSIS — K219 Gastro-esophageal reflux disease without esophagitis: Secondary | ICD-10-CM | POA: Diagnosis present

## 2017-01-12 DIAGNOSIS — N170 Acute kidney failure with tubular necrosis: Secondary | ICD-10-CM | POA: Diagnosis not present

## 2017-01-12 DIAGNOSIS — Z515 Encounter for palliative care: Secondary | ICD-10-CM | POA: Diagnosis not present

## 2017-01-12 DIAGNOSIS — Z7189 Other specified counseling: Secondary | ICD-10-CM | POA: Diagnosis not present

## 2017-01-12 LAB — URINALYSIS, ROUTINE W REFLEX MICROSCOPIC
Bilirubin Urine: NEGATIVE
GLUCOSE, UA: NEGATIVE mg/dL
Ketones, ur: NEGATIVE mg/dL
Nitrite: NEGATIVE
PH: 6 (ref 5.0–8.0)
Protein, ur: 100 mg/dL — AB
SPECIFIC GRAVITY, URINE: 1.013 (ref 1.005–1.030)
Squamous Epithelial / LPF: NONE SEEN

## 2017-01-12 LAB — POC OCCULT BLOOD, ED: FECAL OCCULT BLD: NEGATIVE

## 2017-01-12 LAB — PREPARE RBC (CROSSMATCH)

## 2017-01-12 MED ORDER — HYDRALAZINE HCL 20 MG/ML IJ SOLN: 10.0000 mg | INTRAMUSCULAR | Status: DC | PRN

## 2017-01-12 MED ORDER — SIMVASTATIN 20 MG PO TABS
20.0000 mg | ORAL_TABLET | Freq: Every day | ORAL | Status: DC
  Administered 2017-01-12 – 2017-01-13 (×2): 20 mg via ORAL
  Filled 2017-01-12 (×2): qty 1

## 2017-01-12 MED ORDER — ACETAMINOPHEN 325 MG PO TABS
650.0000 mg | ORAL_TABLET | Freq: Four times a day (QID) | ORAL | Status: DC | PRN
Start: 2017-01-12 — End: 2017-01-13
  Administered 2017-01-12: 650 mg via ORAL
  Filled 2017-01-12: qty 2

## 2017-01-12 MED ORDER — SODIUM CHLORIDE 0.9 % IV SOLN
10.0000 mL/h | Freq: Once | INTRAVENOUS | Status: AC
  Administered 2017-01-12: 10 mL/h via INTRAVENOUS

## 2017-01-12 MED ORDER — SODIUM CHLORIDE 0.9 % IV SOLN
INTRAVENOUS | Status: AC
  Administered 2017-01-12 – 2017-01-13 (×2): via INTRAVENOUS

## 2017-01-12 MED ORDER — FENTANYL CITRATE (PF) 100 MCG/2ML IJ SOLN
25.0000 ug | INTRAMUSCULAR | Status: DC | PRN
  Administered 2017-01-12 – 2017-01-13 (×2): 25 ug via INTRAVENOUS
  Filled 2017-01-12 (×2): qty 2

## 2017-01-12 MED ORDER — IOPAMIDOL (ISOVUE-300) INJECTION 61%
INTRAVENOUS | Status: AC
  Administered 2017-01-12: 30 mL
  Filled 2017-01-12: qty 30

## 2017-01-12 MED ORDER — AMLODIPINE BESYLATE 5 MG PO TABS
5.0000 mg | ORAL_TABLET | Freq: Every day | ORAL | Status: DC
  Administered 2017-01-12 – 2017-01-19 (×8): 5 mg via ORAL
  Filled 2017-01-12 (×8): qty 1

## 2017-01-12 MED ORDER — HYDRALAZINE HCL 50 MG PO TABS
50.0000 mg | ORAL_TABLET | Freq: Three times a day (TID) | ORAL | Status: DC
  Administered 2017-01-12 – 2017-01-19 (×20): 50 mg via ORAL
  Filled 2017-01-12 (×21): qty 1

## 2017-01-12 MED ORDER — PANTOPRAZOLE SODIUM 40 MG PO TBEC
40.0000 mg | DELAYED_RELEASE_TABLET | Freq: Every day | ORAL | Status: DC
  Administered 2017-01-12 – 2017-01-19 (×8): 40 mg via ORAL
  Filled 2017-01-12 (×8): qty 1

## 2017-01-12 MED ORDER — HYDRALAZINE HCL 20 MG/ML IJ SOLN: 10.0000 mg | Freq: Three times a day (TID) | INTRAMUSCULAR | Status: DC | PRN

## 2017-01-12 MED ORDER — ACETAMINOPHEN 650 MG RE SUPP: 650.0000 mg | Freq: Four times a day (QID) | RECTAL | Status: DC | PRN

## 2017-01-12 MED ORDER — ENSURE ENLIVE PO LIQD
237.0000 mL | Freq: Two times a day (BID) | ORAL | Status: DC
  Administered 2017-01-12 – 2017-01-19 (×4): 237 mL via ORAL

## 2017-01-12 MED ORDER — HEPARIN SODIUM (PORCINE) 5000 UNIT/ML IJ SOLN
5000.0000 [IU] | Freq: Three times a day (TID) | INTRAMUSCULAR | Status: DC
  Administered 2017-01-12 – 2017-01-16 (×14): 5000 [IU] via SUBCUTANEOUS
  Filled 2017-01-12 (×11): qty 1

## 2017-01-12 MED ORDER — OXYBUTYNIN CHLORIDE 5 MG PO TABS
5.0000 mg | ORAL_TABLET | Freq: Two times a day (BID) | ORAL | Status: DC
  Administered 2017-01-12 – 2017-01-19 (×15): 5 mg via ORAL
  Filled 2017-01-12 (×15): qty 1

## 2017-01-12 NOTE — Progress Notes (Signed)
Patient seen and examined. Admitted after midnight secondary to abdominal pain, fatigue, worsening anemia and renal failure. FOBT neg. Elevated BP appreciated and otherwise stable vital signs. Symptoms most likely due to further progression of her MM. Will involved palliative care for goals of care and advance directives. Please refer to H&P written by Dr. ?Kakrakandy for further info/details on admission.   [name] MD [phone]

## 2017-01-12 NOTE — Progress Notes (Signed)
Initial Nutrition Assessment  DOCUMENTATION CODES:   Severe malnutrition in context of chronic illness  INTERVENTION:   Ensure Enlive po TID, each supplement provides 350 kcal and 20 grams of protein  NUTRITION DIAGNOSIS:   Malnutrition (Severe) related to chronic illness (Multiple Myeloma) as evidenced by severe depletion of body fat, severe depletion of muscle mass, 12 percent weight loss in 4 months.   GOAL:   Patient will meet greater than or equal to 90% of their needs   MONITOR:   PO intake, Supplement acceptance, Weight trends  REASON FOR ASSESSMENT:   Malnutrition Screening Tool   ASSESSMENT:   Pt with PMH of HTN, HLD, CKD, and multiple myeloma-not currently being treated. Presents this admission with abdominal pain likely from progression of multiple myeloma.  Pt currently eating 60% of her last meal. Pt very lethargic upon interview. Reports loss of appetite due to abdominal pain for an unknown amount of time. Suspect pt meets criteria for decreased energy intake for malnutrition.   Reports a UBW of 115 lb, the last time being at that weight unknown. Records indicate pt has lost 12 % in body weight since March 2018 (106 lb to current 93 lb). This percentage in this time frame is significant for severe malnutrition.   Nutrition-Focused physical exam completed. Findings are severe fat depletion in all regions, severe muscle depletion in all regions, and mild edema.   Medications reviewed. Labs reviewed: CBG 103, Albumin 3.3  Diet Order:  Diet Heart Room service appropriate? Yes; Fluid consistency: Thin  Skin:  Reviewed, no issues  Last BM:  01/11/17  Height:   Ht Readings from Last 1 Encounters:  01/12/17 '5\' 5"'  (1.651 m)    Weight:   Wt Readings from Last 1 Encounters:  01/12/17 93 lb 3.2 oz (42.3 kg)    Ideal Body Weight:  56.8 kg  BMI:  Body mass index is 15.51 kg/m.  Estimated Nutritional Needs:   Kcal:  1500-1700 (26-29 kcal/kg  IBW)  Protein:  75-85 grams (1.7-2 g/kg)  Fluid:  >1.5 L/day  EDUCATION NEEDS:   No education needs identified at this time  [place], LDN Clinical Nutrition Pager # - [phone]

## 2017-01-12 NOTE — H&P (Signed)
History and Physical    AKIYA MORR Brooks:[id number] DOB: 06-Aug-1929 DOA: 01/11/2017  PCP: Axel Filler, MD  Patient coming from: Home.  Chief Complaint: Abdominal pain.  HPI: Kayla Brooks is a 83 y.o. female with history of multiple myeloma presently not on treatment since patient was not able to tolerate last November 2017, chronic kidney disease, hypertension and anemia presents to the ER with complaints of worsening pain in the abdomen particularly on the pelvic bones. Patient's pain has been increasing progressively over the last few weeks and last 2-3 weeks and hardly has been able to ambulate. Denies any nausea vomiting or diarrhea. Denies any chest pain or shortness of breath.   ED Course: In the ER he patient's blood work showed hemoglobin of 6.8 decreased from 8.7. Stool for occult blood negative. Creatinine has increased from 1.7-2.7 over the period of 2 months. UA did not show any RBCs or casts. Patient is being admitted for further management of pain and acute renal failure and anemia.  Review of Systems: As per HPI, rest all negative.   Past Medical History:  Diagnosis Date  . Arthritis    "right knee" (06/10/2015)  . Chronic anemia   . Chronic diarrhea    Archie Endo 06/10/2015  . GERD (gastroesophageal reflux disease)   . Hyperlipidemia   . Hypertension   . Monoclonal paraproteinemia   . Multiple myeloma without mention of remission   . Plasmacytoma of bone ([place])    right femur/notes 06/04/2015  . Pneumonia    "years ago" (06/10/2015)  . Urinary frequency     Past Surgical History:  Procedure Laterality Date  . BONE MARROW BIOPSY     "for multiple myeloma"  . CATARACT EXTRACTION Right   . FEMUR SURGERY Right 01/2008   1.  prophylactic fixation impending pathological fracture of the femur/notes 06/04/2015  . FRACTURE SURGERY    . VAGINAL HYSTERECTOMY     "partial"     reports that she has quit smoking. Her smoking use included Cigarettes. She  has a 80.00 pack-year smoking history. She has never used smokeless tobacco. She reports that she drinks alcohol. She reports that she does not use drugs.  Allergies  Allergen Reactions  . Morphine And Related Other (See Comments)    Abdominal Pain    Family History  Problem Relation Age of Onset  . Multiple myeloma Neg Hx     Prior to Admission medications   Medication Sig Start Date End Date Taking? Authorizing Provider  amLODipine (NORVASC) 5 MG tablet take 1 tablet by mouth once daily 07/12/16  Yes Axel Filler, MD  aspirin 81 MG tablet Take 1 tablet (81 mg total) by mouth daily. 09/08/12  Yes Trish Fountain, MD  diclofenac sodium (VOLTAREN) 1 % GEL Apply 2 g topically 4 (four) times daily. Patient taking differently: Apply 2 g topically 4 (four) times daily as needed (pain).  08/18/16  Yes Pisciotta, Elmyra Ricks, PA-C  HYDROcodone-acetaminophen (NORCO/VICODIN) 5-325 MG tablet Take 1 tablet by mouth every 6 (six) hours as needed for moderate pain. 01/07/17  Yes Wyatt Portela, MD  ibuprofen (ADVIL,MOTRIN) 600 MG tablet Take 1 tablet (600 mg total) by mouth every 6 (six) hours as needed for moderate pain. 06/11/15  Yes Maryellen Pile, MD  lisinopril (PRINIVIL,ZESTRIL) 40 MG tablet take 1 tablet by mouth once daily 11/01/16  Yes Axel Filler, MD  nitroGLYCERIN (NITROSTAT) 0.4 MG SL tablet Place 0.4 mg under the tongue every 5 (five) minutes  as needed for chest pain.   Yes [provider]  omeprazole (PRILOSEC) 20 MG capsule take 1 capsule by mouth once daily 12/05/15  Yes Axel Filler, MD  oxybutynin [facility]) 5 MG tablet take 1 tablet by mouth twice a day 04/06/16  Yes Axel Filler, MD  prochlorperazine (COMPAZINE) 10 MG tablet Take 1 tablet (10 mg total) by mouth every 6 (six) hours as needed for nausea or vomiting. 05/05/16  Yes Wyatt Portela, MD  simvastatin (ZOCOR) 20 MG tablet take 1 tablet by mouth at bedtime 04/10/15  Yes Corky Sox, MD    Physical Exam: Vitals:   01/12/17 0030 01/12/17 0130 01/12/17 0231 01/12/17 0252  BP: (!) 198/147 (!) 180/104 (!) 175/67 (!) 168/68  Pulse: 77  74 73  Resp:   20 18  Temp:   98.4 F (36.9 C) 98 F (36.7 C)  TempSrc:   Oral Oral  SpO2: 100%  100% 100%      Constitutional: Moderately built and nourished. Vitals:   01/12/17 0030 01/12/17 0130 01/12/17 0231 01/12/17 0252  BP: (!) 198/147 (!) 180/104 (!) 175/67 (!) 168/68  Pulse: 77  74 73  Resp:   20 18  Temp:   98.4 F (36.9 C) 98 F (36.7 C)  TempSrc:   Oral Oral  SpO2: 100%  100% 100%   Eyes: Anicteric mild pallor. ENMT: No discharge from the ears eyes nose and mouth. Neck: No mass felt. No neck rigidity. Respiratory: No rhonchi or crepitations. Cardiovascular: S1 and S2 heard no murmurs appreciated. Abdomen: Soft nontender bowel sounds present. Musculoskeletal: Bilateral lower extremity edema. Skin: No rash. Skin appears warm. Neurologic: Alert awake oriented to time place and person. Moves all extremities. Psychiatric: Appears normal. Normal affect.   Labs on Admission: I have personally reviewed following labs and imaging studies  CBC:  Recent Labs Lab 01/11/17 2154  WBC 3.7*  HGB 6.8*  HCT 21.1*  MCV 99.5  PLT 400   Basic Metabolic Panel:  Recent Labs Lab 01/11/17 2154  NA 139  K 4.2  CL 113*  CO2 14*  GLUCOSE 103*  BUN 56*  CREATININE 2.75*  CALCIUM 9.6   GFR: CrCl cannot be calculated (Unknown ideal weight.). Liver Function Tests:  Recent Labs Lab 01/11/17 2154  AST 22  ALT 13*  ALKPHOS 71  BILITOT 0.7  PROT 9.2*  ALBUMIN 3.3*    Recent Labs Lab 01/11/17 2154  LIPASE 21   No results for input(s): AMMONIA in the last 168 hours. Coagulation Profile: No results for input(s): INR, PROTIME in the last 168 hours. Cardiac Enzymes: No results for input(s): CKTOTAL, CKMB, CKMBINDEX, TROPONINI in the last 168 hours. BNP (last 3 results) No results for input(s):  PROBNP in the last 8760 hours. HbA1C: No results for input(s): HGBA1C in the last 72 hours. CBG: No results for input(s): GLUCAP in the last 168 hours. Lipid Profile: No results for input(s): CHOL, HDL, LDLCALC, TRIG, CHOLHDL, LDLDIRECT in the last 72 hours. Thyroid Function Tests: No results for input(s): TSH, T4TOTAL, FREET4, T3FREE, THYROIDAB in the last 72 hours. Anemia Panel: No results for input(s): VITAMINB12, FOLATE, FERRITIN, TIBC, IRON, RETICCTPCT in the last 72 hours. Urine analysis:    Component Value Date/Time   COLORURINE YELLOW 01/11/2017 2355   APPEARANCEUR CLEAR 01/11/2017 2355   LABSPEC 1.013 01/11/2017 2355   PHURINE 6.0 01/11/2017 2355   GLUCOSEU NEGATIVE 01/11/2017 2355   GLUCOSEU NEG mg/dL 11/21/2006 2037   HGBUR SMALL (  A) 01/11/2017 2355   BILIRUBINUR NEGATIVE 01/11/2017 2355   KETONESUR NEGATIVE 01/11/2017 2355   PROTEINUR 100 (A) 01/11/2017 2355   UROBILINOGEN 0.2 12/11/2014 1410   NITRITE NEGATIVE 01/11/2017 2355   LEUKOCYTESUR SMALL (A) 01/11/2017 2355   Sepsis Labs: '@LABRCNTIP' (procalcitonin:4,lacticidven:4) )No results found for this or any previous visit (from the past 240 hour(s)).   Radiological Exams on Admission: No results found.   Assessment/Plan Principal Problem:   Abdominal pain Active Problems:   Multiple myeloma (HCC)   Essential hypertension   Normochromic normocytic anemia   ARF (acute renal failure) ([place])    1. Abdominal pain likely from progression of patient's known history of multiple myeloma. May discuss with Dr. Alen Blew patient's oncologist. I have placed patient on pain relief medications. Since patient has significant pain I have ordered CT abdomen and pelvis without contrast. 2. Normocytic normochromic anemia with worsening likely from multiple myeloma - 2 units of packed red blood cell transfusion has been ordered. 3. Acute on chronic renal failure stage III - hold lisinopril. UA does not show any RBCs or casts. 2  units of PRBC has been ordered recheck metabolic panel. Check CT abdomen and pelvis to rule out any obstruction. 4. Hypertension uncontrolled - hold lisinopril due to renal failure. I have placed patient on IV hydralazine. May need to change lisinopril to other medications probably amlodipine.  I have reviewed patient's old charts and labs.   DVT prophylaxis: SCDs. Code Status: Full code.  Family Communication: Family at the bedside.  Disposition Plan: Home.  Consults called: None.  Admission status: Observation.    Rise Patience MD [facility] Pager [phone].  If 7PM-7AM, please contact night-coverage [email or web address] Password [email or web address]  01/12/2017, 3:01 AM

## 2017-01-12 NOTE — Discharge Instructions (Signed)

## 2017-01-12 NOTE — Care Management Obs Status (Signed)
[facility] NOTIFICATION   Patient Details  Name: LUNETTA MARINA MRN: [id number] Date of Birth: 1930-06-21   Medicare Observation Status Notification Given:  Yes    Carles Collet, RN 01/12/2017, 4:04 PM

## 2017-01-13 ENCOUNTER — Inpatient Hospital Stay (HOSPITAL_COMMUNITY): Payer: Medicare Other

## 2017-01-13 DIAGNOSIS — Z515 Encounter for palliative care: Secondary | ICD-10-CM

## 2017-01-13 DIAGNOSIS — C9002 Multiple myeloma in relapse: Secondary | ICD-10-CM

## 2017-01-13 DIAGNOSIS — R609 Edema, unspecified: Secondary | ICD-10-CM

## 2017-01-13 LAB — TYPE AND SCREEN
ABO/RH(D): O POS
Antibody Screen: NEGATIVE
UNIT DIVISION: 0
Unit division: 0

## 2017-01-13 LAB — CBC
HEMATOCRIT: 26.6 % — AB (ref 36.0–46.0)
HEMOGLOBIN: 8.9 g/dL — AB (ref 12.0–15.0)
MCH: 31.6 pg (ref 26.0–34.0)
MCHC: 33.5 g/dL (ref 30.0–36.0)
MCV: 94.3 fL (ref 78.0–100.0)
Platelets: 139 10*3/uL — ABNORMAL LOW (ref 150–400)
RBC: 2.82 MIL/uL — AB (ref 3.87–5.11)
RDW: 16.9 % — ABNORMAL HIGH (ref 11.5–15.5)
WBC: 3.3 10*3/uL — AB (ref 4.0–10.5)

## 2017-01-13 LAB — BASIC METABOLIC PANEL
ANION GAP: 9 (ref 5–15)
BUN: 49 mg/dL — ABNORMAL HIGH (ref 6–20)
CHLORIDE: 115 mmol/L — AB (ref 101–111)
CO2: 15 mmol/L — AB (ref 22–32)
Calcium: 8.6 mg/dL — ABNORMAL LOW (ref 8.9–10.3)
Creatinine, Ser: 1.99 mg/dL — ABNORMAL HIGH (ref 0.44–1.00)
GFR calc non Af Amer: 22 mL/min — ABNORMAL LOW (ref >60)
GFR, EST AFRICAN AMERICAN: 25 mL/min — AB (ref >60)
Glucose, Bld: 90 mg/dL (ref 65–99)
POTASSIUM: 3.9 mmol/L (ref 3.5–5.1)
SODIUM: 139 mmol/L (ref 135–145)

## 2017-01-13 LAB — BPAM RBC
Blood Product Expiration Date: 201808212359
Blood Product Expiration Date: 201808212359
ISSUE DATE / TIME: 201807250209
ISSUE DATE / TIME: 201807250552
UNIT TYPE AND RH: 5100
Unit Type and Rh: 5100

## 2017-01-13 MED ORDER — ACETAMINOPHEN 325 MG PO TABS
650.0000 mg | ORAL_TABLET | Freq: Four times a day (QID) | ORAL | Status: DC | PRN
  Administered 2017-01-13 – 2017-01-19 (×6): 650 mg via ORAL
  Filled 2017-01-13 (×8): qty 2

## 2017-01-13 MED ORDER — SODIUM BICARBONATE 650 MG PO TABS
650.0000 mg | ORAL_TABLET | Freq: Two times a day (BID) | ORAL | Status: DC
  Administered 2017-01-13 – 2017-01-14 (×2): 650 mg via ORAL
  Filled 2017-01-13 (×2): qty 1

## 2017-01-13 MED ORDER — SODIUM CHLORIDE 0.9 % IV SOLN
INTRAVENOUS | Status: AC
Start: 2017-01-13 — End: 2017-01-14
  Administered 2017-01-13 – 2017-01-14 (×2): via INTRAVENOUS

## 2017-01-13 MED ORDER — HYDROCODONE-ACETAMINOPHEN 5-325 MG PO TABS
1.0000 | ORAL_TABLET | Freq: Four times a day (QID) | ORAL | Status: DC | PRN
  Administered 2017-01-13 – 2017-01-14 (×3): 1 via ORAL
  Filled 2017-01-13 (×3): qty 1

## 2017-01-13 NOTE — Progress Notes (Signed)
*  PRELIMINARY RESULTS* Vascular Ultrasound Lower extremity venous duplex has been completed.  Preliminary findings: no evidence of DVT. Large areas of mixed echoes noted in the bilateral popliteal fossa, possibly complex baker's cysts.    Landry Mellow, RDMS, RVT  01/13/2017, 9:02 AM

## 2017-01-13 NOTE — Progress Notes (Signed)
PT Cancellation Note  Patient Details Name: Kayla Brooks MRN: [id number] DOB: Oct 16, 1929   Cancelled Treatment:    Reason Eval/Treat Not Completed: Fatigue/lethargy limiting ability to participate;Pain limiting ability to participate.  Has been medicated initially and now is not wanting to move, and agreed to do therapy in the AM.   Ramond Dial 01/13/2017, 2:43 PM   Mee Hives, PT MS Acute Rehab Dept. Number: [place] [facility] [place]

## 2017-01-13 NOTE — Progress Notes (Signed)
Responded to consult for palliative. Nurse said pt was in rm w/o family and could likely talk -- she had just met her. Pt was sitting in bedside chair seeming to doze. She opened her eyes and looked at me when I came in and introduced self, and said she could probably wake up enough to talk to me if I wanted, and I said no, you sleep, I can come again in the morning if you like, I never like to disturb someone when they're sleeping. She briefly nodded, and closed her eyes again. Chaplain available for f/u.   01/13/17 1600  Clinical Encounter Type  Visited With Patient not available;Health care provider  Visit Type Initial;Psychological support;Spiritual support;Social support  Referral From Nurse   Gerrit Heck, Chaplain

## 2017-01-13 NOTE — Progress Notes (Signed)
[facility] PROGRESS NOTE  Kayla Brooks TMA:[id number] DOB: April 18, 1930 DOA: 01/11/2017 PCP: Axel Filler, MD  Interim summary and HPI 83 y.o. female with history of multiple myeloma presently not on treatment since patient was not able to tolerate last November 2017, chronic kidney disease, hypertension and anemia presents to the ER with complaints of worsening pain in the abdomen particularly on the pelvic bones. Patient's pain has been increasing progressively over the last few weeks and in the last 2-3 weeks hardly has been able to ambulate and has not been eating or drinking properly..   Assessment/Plan: 1-abd pain: -base on CT results appears to be secondary to further progression of known metastatic MM -patient with cholelithiasis as well, but pain not associated with food and no signs of cholecystitis seen. -per Dr. Hazeline Junker most recent note; not cure or further treatment to be offer  -palliative care consulted; as patient most likely will benefit of hospice -will start PRN pain meds  2-symptomatic anemia -from MM  -no signs of acute bleeding  -received 2 units of PRBC's -Hgb 8.9 now -will monitor  3-acute on chronic renal failure: stage 3 at baseline -due to acute azotemia and continue use or lisinopril -progression of MM also contributing -will provide gentle hydration and follow clinical response and renal function trend. -Cr 2.56 now  4-metabolic acidosis -due to renal failure -will add sodium bicarbonate BID   5-HTN -will stop lisinopril for now -continue hydralazine  6-severe protein calorie malnutrition  -nutritional service on board -will follow rec's for feeding supplements  7-HLD -at this point stopping statins  8-GERD -continue PPI    Code Status: Full Family Communication: no family at bedside and patient asked not to communicate over the phone with them. Disposition Plan: follow palliative care recommendations; continue IVF's and  supportive care.    Consultants:  Palliative care   Procedures:  See below for x-ray reports   Antibiotics:  None   HPI/Subjective: Afebrile, no CP, no SOB. reporting abd pain and some headache.  Objective: Vitals:   01/13/17 0945 01/13/17 1720  BP: (!) 164/57 130/80  Pulse: 61 66  Resp: 18 18  Temp: (!) 97.4 F (36.3 C) 98.4 F (36.9 C)    Intake/Output Summary (Last 24 hours) at 01/13/17 2112 Last data filed at 01/13/17 2100  Gross per 24 hour  Intake           966.25 ml  Output              500 ml  Net           466.25 ml   Filed Weights   01/12/17 0524  Weight: 42.3 kg (93 lb 3.2 oz)    Exam:   General: afebrile, complaining of abd pain, no nausea, no vomiting; reporting some HA's as well. No SOB and no CP. Patient chronically ill in appearance and cachetic.   Cardiovascular: S1 and S2, no rubs, no gallops  Respiratory: good air movement, no wheezing, no crackles  Abdomen: soft, ND, reporting pain in her abd diffusely; positive BS  Musculoskeletal: no edema, no cyanosis, no clubbing  Data Reviewed: Basic Metabolic Panel:  Recent Labs Lab 01/11/17 2154 01/13/17 0749  NA 139 139  K 4.2 3.9  CL 113* 115*  CO2 14* 15*  GLUCOSE 103* 90  BUN 56* 49*  CREATININE 2.75* 1.99*  CALCIUM 9.6 8.6*   Liver Function Tests:  Recent Labs Lab 01/11/17 2154  AST 22  ALT 13*  ALKPHOS  71  BILITOT 0.7  PROT 9.2*  ALBUMIN 3.3*    Recent Labs Lab 01/11/17 2154  LIPASE 21   CBC:  Recent Labs Lab 01/11/17 2154 01/13/17 0749  WBC 3.7* 3.3*  HGB 6.8* 8.9*  HCT 21.1* 26.6*  MCV 99.5 94.3  PLT 169 139*    Studies: Ct Abdomen Pelvis Wo Contrast  Result Date: 01/12/2017 CLINICAL DATA:  83 y/o F; history of ovarian cancer. Abdominal pain. Concern for hydronephrosis. EXAM: CT ABDOMEN AND PELVIS WITHOUT CONTRAST TECHNIQUE: Multidetector CT imaging of the abdomen and pelvis was performed following the standard protocol without IV contrast.  COMPARISON:  01/25/2009 CT abdomen and pelvis FINDINGS: Lower chest: No acute abnormality. Decreased attenuation of cardiac blood pool compatible with anemia. Hepatobiliary: Cholelithiasis. No focal liver lesion. No intra or extrahepatic biliary ductal dilatation. Pancreas: Unremarkable. No pancreatic ductal dilatation or surrounding inflammatory changes. Spleen: Normal in size without focal abnormality. Adrenals/Urinary Tract: Normal adrenal glands. No focal kidney lesion identified. No hydronephrosis. Mildly distended bladder. Stomach/Bowel: No definite obstructive or inflammatory changes of the bowel. Oral contrast extends into the distal small bowel. Moderate volume of stool in the colon. Vascular/Lymphatic: Aortic atherosclerosis with severe calcific atherosclerosis. No enlarged abdominal or pelvic lymph nodes. Reproductive: Status post hysterectomy. No adnexal masses. Other:  Negative. Musculoskeletal: Partially visualized right hip replacement. Diffuse mixed sclerotic and lucent lesions throughout the visualized bones up proximal femurs, pelvis, and spine. No acute fracture identified. IMPRESSION: 1. Diffuse mixed sclerotic and lucent lesions throughout the visualized bones up proximal femurs, pelvis, and spine probably representing metastatic disease or myeloma. No acute fracture identified. 2. Cholelithiasis. 3. No hydronephrosis. 4. Severe calcific atherosclerosis of the aorta. Electronically Signed   By: Kristine Garbe M.D.   On: 01/12/2017 03:14    Scheduled Meds: . amLODipine  5 mg Oral Daily  . feeding supplement (ENSURE ENLIVE)  237 mL Oral BID BM  . heparin  5,000 Units Subcutaneous Q8H  . hydrALAZINE  50 mg Oral Q8H  . oxybutynin  5 mg Oral BID  . pantoprazole  40 mg Oral Daily  . simvastatin  20 mg Oral QHS   Continuous Infusions:  Principal Problem:   Abdominal pain Active Problems:   Multiple myeloma (HCC)   Essential hypertension   Normochromic normocytic anemia    ARF (acute renal failure) ([place])   Palliative care by specialist    Time spent: 30 minutes    [name]  [facility] Pager [phone] If 7PM-7AM, please contact night-coverage at [email or web address], password [facility] 01/13/2017, 9:12 PM  LOS: 1 day

## 2017-01-13 NOTE — Consult Note (Signed)
[facility] CM Primary Care Navigator  01/13/2017  Kayla Brooks [birth year] [id number]   Met with patient, son and other family members at the bedside to identify possible discharge needs. Patient reports having increased abdominal pain, shoulder pain and extreme weakness ("was hard to walk") that had led to this admission.  Patientendorses Dr. Lalla Brothers with [facility] as herprimary care provider.   Patient shared using [place] [facility] to obtain medications without difficulty.   Patient reports managing her medications at home straight out of the containers.   Patientmentionedthat she does not drive but she rides the "city bus" to go places. Her sons Kayla Brooks and Kayla Brooks) providetransportationto herdoctors'appointments when possible and needed.   Patient lives alone and independent with care. She reports having family members living close by who can provide assistance with care when needed.  Anticipated discharge plan is home according to patient. She declined offer for EMMI (General) calls to follow-up on her at home. Patient states she has "good family support" and they are in and out of her house to look after her needs.  Patient voiced understanding to follow-up with aprimary care providerwhen she returns back home,for a post discharge follow-up appointment within a week or sooner if needs arise.Patient letter (with PCP's contact number) was provided as a reminder.  Explained to patient about [facility] CM services available for her but deniesany current needs or concerns at this time.  She voiced understanding to seekreferral to [facility] care managementservicesfrom primary care provider if deemed necessary.  [facility] care management information provided for future needs that may arise.  Noted that Palliative Care consulted for establishing goals of care and pain control.   For questions, please  contact:  Dannielle Huh, BSN, RN- [facility] Primary Care Navigator  Telephone: [phone] [place]

## 2017-01-13 NOTE — Consult Note (Signed)
Consultation Note Date: 01/13/2017   Patient Name: Kayla Brooks  DOB: 11/03/1929  MRN: [id number]  Age / Sex: 83 y.o., female  PCP: Axel Filler, MD Referring Physician: [name], MD  Reason for Consultation: Establishing goals of care and Pain control  HPI/Patient Profile: Kayla Brooks is a 83 y.o. female with history of multiple myeloma presently not on treatment since patient was not able to tolerate last November 2017, chronic kidney disease, hypertension and anemia presents to the ER with complaints of worsening pain in the abdomen particularly on the pelvic bones. Patient's pain has been increasing progressively over the last few weeks and last 2-3 weeks and hardly has been able to ambulate. Denies any nausea vomiting or diarrhea. Denies any chest pain or shortness of breath.   Continued physical and functional decline, patient faces advanced directive decision , and anticipatory care needs.     Clinical Assessment and Goals of Care:  [name] noted to be resting comfortably in chair. She stated she did not want to discuss her health problems and did not wish to discuss goals or care or planning. She stated she has not "thought about any of that yet" and is not ready at this time. She endorses children as a support network but states her sons are truck drivers and a daughter who has health concerns. A family meeting was offered, however Ms. Collie Siad does not want them to be contacted.  We will have the team to return tomorrow to speak with her further. She does complain of uncontrolled pain and her home Norco was initiated.    PATIENT- no documented HPOA or AD    SUMMARY OF RECOMMENDATIONS    Code Status/Advance Care Planning:  Full code   Symptom Management:   Home Norco initiated for pain.  Palliative Prophylaxis:   Frequent Pain Assessment  Additional Recommendations  (Limitations, Scope, Preferences):  Full Scope Treatment  Psycho-social/Spiritual:   Desire for further Chaplaincy support:yes   Prognosis:   Unable to determine  Discharge Planning: To Be Determined      Primary Diagnoses: Present on Admission: . Abdominal pain . Essential hypertension . Normochromic normocytic anemia . Multiple myeloma ([place]) . ARF (acute renal failure) ([place])   I have reviewed the medical record, interviewed the patient and family, and examined the patient. The following aspects are pertinent.  Past Medical History:  Diagnosis Date  . Arthritis    "right knee" (06/10/2015)  . Chronic anemia   . Chronic diarrhea    Archie Endo 06/10/2015  . GERD (gastroesophageal reflux disease)   . Hyperlipidemia   . Hypertension   . Monoclonal paraproteinemia   . Multiple myeloma without mention of remission   . 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 Pneumonia    "years ago" (06/10/2015)  . Urinary frequency    Social History   Social History  . Marital status: Widowed    Spouse name: N/A  . Number of children: N/A  . Years of education:  N/A   Social History Main Topics  . Smoking status: Former Smoker    Packs/day: 2.00    Years: 40.00    Types: Cigarettes  . Smokeless tobacco: Never Used     Comment: "quit smoking years ago"  . Alcohol use 0.0 oz/week     Comment: 06/10/2015 "drank socially in my younger days"  . Drug use: No  . Sexual activity: No   Other Topics Concern  . None   Social History Narrative  . None   Family History  Problem Relation Age of Onset  . Multiple myeloma Neg Hx    Scheduled Meds: . amLODipine  5 mg Oral Daily  . feeding supplement (ENSURE ENLIVE)  237 mL Oral BID BM  . heparin  5,000 Units Subcutaneous Q8H  . hydrALAZINE  50 mg Oral Q8H  . oxybutynin  5 mg Oral BID  . pantoprazole  40 mg Oral Daily  . simvastatin  20 mg Oral QHS   Continuous Infusions: PRN Meds:.acetaminophen **OR**  acetaminophen, fentaNYL (SUBLIMAZE) injection, hydrALAZINE Medications Prior to Admission:  Prior to Admission medications   Medication Sig Start Date End Date Taking? Authorizing Provider  amLODipine (NORVASC) 5 MG tablet take 1 tablet by mouth once daily 07/12/16  Yes Axel Filler, MD  aspirin 81 MG tablet Take 1 tablet (81 mg total) by mouth daily. 09/08/12  Yes Trish Fountain, MD  diclofenac sodium (VOLTAREN) 1 % GEL Apply 2 g topically 4 (four) times daily. Patient taking differently: Apply 2 g topically 4 (four) times daily as needed (pain).  08/18/16  Yes Pisciotta, Elmyra Ricks, PA-C  HYDROcodone-acetaminophen (NORCO/VICODIN) 5-325 MG tablet Take 1 tablet by mouth every 6 (six) hours as needed for moderate pain. 01/07/17  Yes Wyatt Portela, MD  ibuprofen (ADVIL,MOTRIN) 600 MG tablet Take 1 tablet (600 mg total) by mouth every 6 (six) hours as needed for moderate pain. 06/11/15  Yes Maryellen Pile, MD  lisinopril (PRINIVIL,ZESTRIL) 40 MG tablet take 1 tablet by mouth once daily 11/01/16  Yes Axel Filler, MD  nitroGLYCERIN (NITROSTAT) 0.4 MG SL tablet Place 0.4 mg under the tongue every 5 (five) minutes as needed for chest pain.   Yes [provider]  omeprazole (PRILOSEC) 20 MG capsule take 1 capsule by mouth once daily 12/05/15  Yes Axel Filler, MD  oxybutynin [facility]) 5 MG tablet take 1 tablet by mouth twice a day 04/06/16  Yes Axel Filler, MD  prochlorperazine (COMPAZINE) 10 MG tablet Take 1 tablet (10 mg total) by mouth every 6 (six) hours as needed for nausea or vomiting. 05/05/16  Yes Wyatt Portela, MD  simvastatin (ZOCOR) 20 MG tablet take 1 tablet by mouth at bedtime 04/10/15  Yes Corky Sox, MD   Allergies  Allergen Reactions  . Morphine And Related Other (See Comments)    Abdominal Pain   Review of Systems  Constitutional: Positive for fever and unexpected weight change.  Neurological: Positive for weakness.     Physical Exam  Constitutional: She appears lethargic. She appears cachectic. She appears ill.  Neurological: She appears lethargic.  Skin: Skin is warm and dry.    Vital Signs: BP (!) 164/57 (BP Location: Left Arm)   Pulse 61   Temp (!) 97.4 F (36.3 C) (Oral)   Resp 18   Ht '5\' 5"'  (1.651 m)   Wt 42.3 kg (93 lb 3.2 oz)   SpO2 100%   BMI 15.51 kg/m  Pain Assessment: 0-10   Pain  Score: 6    SpO2: SpO2: 100 % O2 Device:SpO2: 100 % O2 Flow Rate: .   IO: Intake/output summary:  Intake/Output Summary (Last 24 hours) at 01/13/17 1109 Last data filed at 01/13/17 0941  Gross per 24 hour  Intake          1306.25 ml  Output              500 ml  Net           806.25 ml    LBM: Last BM Date: 01/12/17 Baseline Weight: Weight: 42.3 kg (93 lb 3.2 oz) Most recent weight: Weight: 42.3 kg (93 lb 3.2 oz)     Palliative Assessment/Data:  40 %   Flowsheet Rows     Most Recent Value  Intake Tab  Unit at Time of Referral  Med/Surg Unit  Palliative Care Primary Diagnosis  Cancer  Date Notified  01/13/17  Palliative Care Type  New Palliative care  Reason for referral  Pain, Clarify Goals of Care  Date of Admission  01/11/17  # of days IP prior to Palliative referral  2  Clinical Assessment  Palliative Performance Scale Score  40%  Psychosocial & Spiritual Assessment  Palliative Care Outcomes     Discussed with Dr Dyann Kief  Time In: 1030 Time Out: 1145 Time Total: 45 minutes Greater than 50%  of this time was spent counseling and coordinating care related to the above assessment and plan.  Signed by: Radene Gunning, NP/  Wadie Lessen NP    Please contact Palliative Medicine Team phone at [phone] for questions and concerns.  For individual provider: See Shea Evans

## 2017-01-14 LAB — BASIC METABOLIC PANEL
Anion gap: 9 (ref 5–15)
BUN: 48 mg/dL — AB (ref 6–20)
CHLORIDE: 113 mmol/L — AB (ref 101–111)
CO2: 16 mmol/L — ABNORMAL LOW (ref 22–32)
CREATININE: 1.95 mg/dL — AB (ref 0.44–1.00)
Calcium: 8.5 mg/dL — ABNORMAL LOW (ref 8.9–10.3)
GFR calc Af Amer: 26 mL/min — ABNORMAL LOW (ref >60)
GFR calc non Af Amer: 22 mL/min — ABNORMAL LOW (ref >60)
GLUCOSE: 94 mg/dL (ref 65–99)
Potassium: 3.8 mmol/L (ref 3.5–5.1)
SODIUM: 138 mmol/L (ref 135–145)

## 2017-01-14 LAB — CBC
HEMATOCRIT: 27.4 % — AB (ref 36.0–46.0)
Hemoglobin: 8.9 g/dL — ABNORMAL LOW (ref 12.0–15.0)
MCH: 30.7 pg (ref 26.0–34.0)
MCHC: 32.5 g/dL (ref 30.0–36.0)
MCV: 94.5 fL (ref 78.0–100.0)
PLATELETS: 156 10*3/uL (ref 150–400)
RBC: 2.9 MIL/uL — ABNORMAL LOW (ref 3.87–5.11)
RDW: 16.8 % — AB (ref 11.5–15.5)
WBC: 4.3 10*3/uL (ref 4.0–10.5)

## 2017-01-14 MED ORDER — SODIUM BICARBONATE 650 MG PO TABS
1300.0000 mg | ORAL_TABLET | Freq: Two times a day (BID) | ORAL | Status: DC
  Administered 2017-01-14 – 2017-01-19 (×10): 1300 mg via ORAL
  Filled 2017-01-14 (×11): qty 2

## 2017-01-14 MED ORDER — OXYCODONE HCL 5 MG PO TABS
5.0000 mg | ORAL_TABLET | ORAL | Status: DC | PRN
  Administered 2017-01-14 – 2017-01-18 (×3): 5 mg via ORAL
  Filled 2017-01-14 (×3): qty 1

## 2017-01-14 NOTE — Progress Notes (Signed)
OT Cancellation Note  Patient Details Name: Kayla Brooks MRN: [id number] DOB: [birth year]   Cancelled Treatment:    Reason Eval/Treat Not Completed: Patient declined, no reason specified. Pt reports she has headache and has just gotten back to bed. Reports she would like for therapy to check back in AM. Noted palliative consult with possible transition to hospice. Will continue to follow as time allows.  Binnie Kand M.S., OTR/L Pager: [phone]  01/14/2017, 11:38 AM

## 2017-01-14 NOTE — Progress Notes (Signed)
Like yesterday, found pt is bedside chair seeming to doze. She again acknowledged my presence, was indisposed to talk, I left her to sleep. Chaplain available for f/u.   01/14/17 1500  Clinical Encounter Type  Visited With Patient not available  Visit Type Follow-up;Psychological support;Spiritual support;Social support  Referral From [place] Dariya Gainer, Chaplain

## 2017-01-14 NOTE — Progress Notes (Signed)
PROGRESS NOTE    Tobie D Breese  MRN:[id number] DOB: 06/22/1929 DOA: 01/11/2017 PCP: Vincent, Duncan Thomas, MD   Brief Narrative: 83-year-old female with history of multiple myeloma followed up with oncologist, Dr. not further chemotherapy, hypertension, chronic kidney disease, anemia, bony metastasis to the pelvis presented with worsening abdominal pain and pelvic bones. Her pain is progressively worsened over last few weeks. She has difficulty emanating now because of the pain is associated with decreasing oral intake. Palliative care consult was requested.  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 -Pain management with oxycodone and IV fentanyl as needed. -I will add standing Robaxin -Palliative care consult requested -PT OT therapy and supportive care. -Follow up with oncologist  #Symptomatic anemia: Status post 2 units of red blood cell transfusion. Monitor CBC. Hemoglobin is stable. No sign of bleeding.  #Acute kidney injury on CKD stage 3 -Serum creatinine level stable and around baseline. Monitor BMP. Avoid nephrotoxins. -continue sod bicarbonate  #Hypertension: Blood pressure acceptable. Continue hydralazine, amlodipine.  #Severe protein calorie malnutrition: Dietary referral and encourage oral intake  #Gerd: On protonix.  #Physical deconditioning: PT OT evaluation and supportive care  Principal Problem:   Abdominal pain Active Problems:   Multiple myeloma (HCC)   Essential hypertension   Normochromic normocytic anemia   ARF (acute renal failure) (HCC)   Palliative care by specialist   DVT prophylaxis: Heparin subcutaneous Code Status: Full code Family Communication: No family at bedside Disposition Plan: Likely discharge home in 1-2 days    Consultants:   Palliative care  Procedures:  None Antimicrobials: None  Subjective: Seen and examined at bedside. Reported the pain is better with the current medication. Has nausea but denied vomiting or abdominal pain chest pain or shortness of breath.  Objective: Vitals:   01/13/17 2235 01/14/17 0504 01/14/17 0830 01/14/17 1311  BP: 107/66 (!) 146/65 137/62 (!) 143/61  Pulse: 72 78 67   Resp: 16 16 16   Temp: 99.2 F (37.3 C) 99.1 F (37.3 C) 98.6 F (37 C)   TempSrc:   Oral   SpO2: 97% 100% 98%   Weight: 44.2 kg (97 lb 6.4 oz)     Height:        Intake/Output Summary (Last 24 hours) at 01/14/17 1542 Last data filed at 01/14/17 1320  Gross per 24 hour  Intake             1275 ml  Output             1125 ml  Net              150 ml   Filed Weights   01/12/17 0524 01/13/17 2235  Weight: 42.3 kg (93 lb 3.2 oz) 44.2 kg (97 lb 6.4 oz)    Examination:  General exam: Appears calm and comfortable  Respiratory system: Clear to auscultation. Respiratory effort normal. No wheezing or crackle Cardiovascular system: S1 & S2 heard, RRR.  No pedal edema. Gastrointestinal system: Abdomen is nondistended, soft and nontender. Normal bowel sounds heard. Central nervous system: Alert Awake and following commands Skin: No rashes, lesions or ulcers Psychiatry: Judgement and insight appear normal. Mood & affect appropriate.     Data Reviewed: I have personally reviewed following labs and imaging studies  CBC:  Recent Labs Lab 01/11/17 2154 01/13/17 0749 01/14/17 0418  WBC 3.7* 3.3* 4.3    HGB 6.8* 8.9* 8.9*  HCT 21.1* 26.6* 27.4*  MCV 99.5 94.3 94.5  PLT 169 139* 696   Basic Metabolic Panel:  Recent Labs Lab 01/11/17 2154 01/13/17 0749 01/14/17 0418  NA 139 139 138  K 4.2 3.9 3.8  CL 113* 115* 113*  CO2 14* 15* 16*  GLUCOSE 103* 90 94  BUN 56* 49* 48*  CREATININE 2.75* 1.99* 1.95*  CALCIUM 9.6 8.6* 8.5*   GFR: Estimated Creatinine Clearance: 14.7 mL/min (A) (by C-G formula based on SCr of 1.95 mg/dL  (H)). Liver Function Tests:  Recent Labs Lab 01/11/17 2154  AST 22  ALT 13*  ALKPHOS 71  BILITOT 0.7  PROT 9.2*  ALBUMIN 3.3*    Recent Labs Lab 01/11/17 2154  LIPASE 21   No results for input(s): AMMONIA in the last 168 hours. Coagulation Profile: No results for input(s): INR, PROTIME in the last 168 hours. Cardiac Enzymes: No results for input(s): CKTOTAL, CKMB, CKMBINDEX, TROPONINI in the last 168 hours. BNP (last 3 results) No results for input(s): PROBNP in the last 8760 hours. HbA1C: No results for input(s): HGBA1C in the last 72 hours. CBG: No results for input(s): GLUCAP in the last 168 hours. Lipid Profile: No results for input(s): CHOL, HDL, LDLCALC, TRIG, CHOLHDL, LDLDIRECT in the last 72 hours. Thyroid Function Tests: No results for input(s): TSH, T4TOTAL, FREET4, T3FREE, THYROIDAB in the last 72 hours. Anemia Panel: No results for input(s): VITAMINB12, FOLATE, FERRITIN, TIBC, IRON, RETICCTPCT in the last 72 hours. Sepsis Labs: No results for input(s): PROCALCITON, LATICACIDVEN in the last 168 hours.  No results found for this or any previous visit (from the past 240 hour(s)).       Radiology Studies: No results found.      Scheduled Meds: . amLODipine  5 mg Oral Daily  . feeding supplement (ENSURE ENLIVE)  237 mL Oral BID BM  . heparin  5,000 Units Subcutaneous Q8H  . hydrALAZINE  50 mg Oral Q8H  . oxybutynin  5 mg Oral BID  . pantoprazole  40 mg Oral Daily  . sodium bicarbonate  650 mg Oral BID   Continuous Infusions:   LOS: 2 days    Dron Tanna Furry, MD [facility] Pager [phone]  If 7PM-7AM, please contact night-coverage [email or web address] Password [facility] 01/14/2017, 3:42 PM

## 2017-01-14 NOTE — Progress Notes (Signed)
PT Cancellation Note  Patient Details Name: Kayla Brooks MRN: [id number] DOB: Oct 27, 1929   Cancelled Treatment:    Reason Eval/Treat Not Completed: Other (comment)  Pt reporting ongoing fatigue/malaise as well as feeling "pestered" by so many staff members trying to talk to her today regarding one thing or another.  Pt politely requested that therapy return on Monday once she has had some time to process her diagnosis/prognosis and determine whether she is interested in pursuing rehab of any kind.  Left pt sitting in recliner with call bell in reach and needs met.     Vesta Wheeland E Penven-Crew 01/14/2017, 2:07 PM

## 2017-01-14 NOTE — Progress Notes (Signed)
Daily Progress Note   Patient Name: Kayla Brooks       Date: 01/14/2017 DOB: 1929-11-29  Age: 83 y.o. MRN#: [id number] Attending Physician: Rosita Fire, MD Primary Care Physician: Axel Filler, MD Admit Date: 01/11/2017  Reason for Consultation/Follow-up: Establishing goals of care and Pain control  Subjective: Kayla Brooks was resting in bed on my arrival to her room. She endorsed a significant headache all morning, which had just started easing up. She also continues to endorse pain in her abdomen, pelvis, and lower back. It has improved minimally since re-starting her Vicodin. She feels the medication takes the edge off, but doesn't reduce it substantially. Her other main concern was feeling tired and worn-out.   Length of Stay: 2  Current Medications: Scheduled Meds:  . amLODipine  5 mg Oral Daily  . feeding supplement (ENSURE ENLIVE)  237 mL Oral BID BM  . heparin  5,000 Units Subcutaneous Q8H  . hydrALAZINE  50 mg Oral Q8H  . oxybutynin  5 mg Oral BID  . pantoprazole  40 mg Oral Daily  . sodium bicarbonate  650 mg Oral BID    Continuous Infusions: . sodium chloride 75 mL/hr at 01/14/17 1037   PRN Meds: acetaminophen, fentaNYL (SUBLIMAZE) injection, hydrALAZINE, HYDROcodone-acetaminophen  Physical Exam  Constitutional: She is oriented to person, place, and time. She appears cachectic. She has a sickly appearance.  HENT:  Head: Normocephalic and atraumatic.  Right Ear: Decreased hearing is noted.  Left Ear: Decreased hearing is noted.  Mouth/Throat: Oropharynx is clear and moist. No oropharyngeal exudate.  Eyes: EOM are normal.  Neck: Normal range of motion. Neck supple.  Pulmonary/Chest: Effort normal.  Musculoskeletal:  Repositioning self in bed, but declined ROM assessment  Neurological: She is  alert and oriented to person, place, and time.  Skin: Skin is warm and dry.  Psychiatric: Her speech is normal. Judgment and thought content normal. Her affect is blunt. She is withdrawn. Cognition and memory are normal.            Vital Signs: BP 137/62 (BP Location: Right Arm)   Pulse 67   Temp 98.6 F (37 C) (Oral)   Resp 16   Ht _0  (1.651 m)   Wt 44.2 kg (97 lb 6.4 oz)   SpO2 98%   BMI 16.21 kg/m  SpO2: SpO2: 98 % O2 Device: O2 Device: Not Delivered O2 Flow Rate:    Intake/output summary:  Intake/Output Summary (Last 24 hours) at 01/14/17 1200 Last data filed at 01/14/17 1030  Gross per 24 hour  Intake             1455 ml  Output              825 ml  Net              630 ml   LBM: Last BM Date: 01/13/17 Baseline Weight: Weight: 42.3 kg (93 lb 3.2 oz) Most recent weight: Weight: 44.2 kg (97 lb 6.4 oz)  Flowsheet Rows     Most Recent Value  Intake Tab  Unit at Time of Referral  Med/Surg Unit  Palliative Care Primary Diagnosis  Cancer  Date Notified  01/13/17  Palliative Care Type  New Palliative care  Reason for referral  Pain, Clarify Goals of Care  Date of Admission  01/11/17  # of days IP prior to Palliative referral  2  Clinical Assessment  Palliative Performance Scale Score  40%  Psychosocial & Spiritual Assessment  Palliative Care Outcomes      Patient Active Problem List   Diagnosis Date Noted  . Palliative care by specialist   . Abdominal pain 01/12/2017  . Normochromic normocytic anemia 01/12/2017  . ARF (acute renal failure) ([place]) 01/12/2017  . Acute kidney injury (nontraumatic) ([place])   . Healthcare maintenance 01/02/2015  . Urge incontinence 12/11/2014  . Multiple myeloma ([place]) 03/27/2009  . Essential hypertension 05/04/2006  . GERD (gastroesophageal reflux disease) 05/04/2006    Palliative Care Assessment & Plan   HPI: ZARI CLY a 83 y.o.femalewith history of multiple myeloma presently not on treatment since patient was not  able to tolerate last November 2017. She also has chronic kidney disease, hypertension and anemia. She presents to the ER with complaints of worsening pain in the abdomen particularly on the pelvic bones. Patient's pain has been increasing progressively over the last few weeks and last 2-3 weeks and hardly has been able to ambulate. Denies any nausea vomiting or diarrhea. Denies any chest pain or shortness of breath.Imaging revealed likely progression of myeloma. She has been followed by Dr. Alen Blew and declined further treatment. Palliative consulted to assist in clarifying goals of care and code status.   Assessment: Kayla Brooks met the palliative team on 7/26. In that Brooks encounter she was clear on not wanting to discuss her health problems, goals of care, or advanced care planning. She also did not want to involve her children in these discussions. At that visit she continued to endorse significant pain, for which her home medication was restarted.   I followed-up again today to check on her pain status and again try to engage her in a conversation about her health. She continues to endorse significant pain, for which her PRN Norco will take the edge off, but will not meaningfully reduce. Given the extent of her disease progression noted on the CT scan, I am not surprised the medication is insufficient. I discussed the possibility of starting a stronger opioid, such as oxycodone. She was hesitant to try anything different, however did agree after we discussed starting at a low dose and increasing slowly. I also think she could benefit from steroids given bone lesions, however she was not open to trying them at present (despite education on efficacy in targeting the type of pain she was having).   She remained steadfast in not wanting to talk about her health, disease state, or advanced care planning. I attempted to clarify the root of her reticence, however she would not engage anything related to  her health other than her pain. In review of Oncology notes it seems she has declined further therapy and opted for supportive management. I do think she is eligible and would benefit from Hospice, unfortunately, she is not  not open to discussing this with me today.  Recommendations/Plan:  Ongoing supportive care, she is not yet ready to engage in goals of care conversations. If her hospitalization is prolonged, it may be worthwhile to contact Dr. Alen Blew to facilitate this conversation, given their prior relationship. If she is stable, this conversation can occur outpatient at his office.   Pain: Stop hydrocodone and start Oxycodone. I think she would greatly  benefit from steroids if she was amenable to trying them (she refused to try them today).   Code Status:  Full code  Prognosis:   Unable to determine  Discharge Planning:  To Be Determined  Care plan was discussed with pt.  Thank you for allowing the Palliative Medicine Team to assist in the care of this patient.  Total time: 25 minutes    Greater than 50%  of this time was spent counseling and coordinating care related to the above assessment and plan.  Charlynn Court, NP Palliative Medicine Team [phone] pager (7a-5p) Team Phone # [phone]

## 2017-01-15 ENCOUNTER — Inpatient Hospital Stay (HOSPITAL_COMMUNITY): Payer: Medicare Other

## 2017-01-15 DIAGNOSIS — R51 Headache: Secondary | ICD-10-CM

## 2017-01-15 DIAGNOSIS — R519 Headache, unspecified: Secondary | ICD-10-CM

## 2017-01-15 LAB — BASIC METABOLIC PANEL
Anion gap: 7 (ref 5–15)
BUN: 39 mg/dL — AB (ref 6–20)
CHLORIDE: 115 mmol/L — AB (ref 101–111)
CO2: 16 mmol/L — AB (ref 22–32)
CREATININE: 1.57 mg/dL — AB (ref 0.44–1.00)
Calcium: 8.5 mg/dL — ABNORMAL LOW (ref 8.9–10.3)
GFR calc non Af Amer: 29 mL/min — ABNORMAL LOW (ref >60)
GFR, EST AFRICAN AMERICAN: 34 mL/min — AB (ref >60)
Glucose, Bld: 96 mg/dL (ref 65–99)
Potassium: 3.6 mmol/L (ref 3.5–5.1)
SODIUM: 138 mmol/L (ref 135–145)

## 2017-01-15 MED ORDER — POLYETHYLENE GLYCOL 3350 17 G PO PACK
17.0000 g | PACK | Freq: Every day | ORAL | Status: DC
  Administered 2017-01-15 – 2017-01-19 (×3): 17 g via ORAL
  Filled 2017-01-15 (×5): qty 1

## 2017-01-15 MED ORDER — METHOCARBAMOL 500 MG PO TABS
500.0000 mg | ORAL_TABLET | Freq: Three times a day (TID) | ORAL | Status: DC
  Administered 2017-01-15 – 2017-01-18 (×10): 500 mg via ORAL
  Filled 2017-01-15 (×11): qty 1

## 2017-01-15 MED ORDER — SENNOSIDES-DOCUSATE SODIUM 8.6-50 MG PO TABS
1.0000 | ORAL_TABLET | Freq: Two times a day (BID) | ORAL | Status: DC
  Administered 2017-01-15 – 2017-01-19 (×7): 1 via ORAL
  Filled 2017-01-15 (×9): qty 1

## 2017-01-15 NOTE — Progress Notes (Addendum)
PROGRESS NOTE    Kayla Brooks  VQQ:[id number] DOB: 10-Jan-1930 DOA: 01/11/2017 PCP: Axel Filler, MD   Brief Narrative: 83 year old female with history of multiple myeloma followed up with oncologist, Dr. not further chemotherapy, hypertension, chronic kidney disease, anemia, bony metastasis to the pelvis presented with worsening abdominal pain and pelvic bones. Her pain is progressively worsened over last few weeks. She has difficulty emanating now because of the pain is associated with decreasing oral intake. Palliative care consult was requested.  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 -Pain management with oxycodone and IV fentanyl as needed. -I will add standing Robaxin -Palliative care consult requested. Patient hasn't make decision about goals of care yet. He doesn't like to discuss about it now. Complaining of severe headache. -PT OT therapy and supportive care. -Follow up with oncologist outpatient.  #Severe diffuse headache: Checked stat CT head, and history of malignancy. She didn't result came back unremarkable. Continue Tylenol and current pain management. Patient has no focal neurological deficit.  #Symptomatic anemia: Status post 2 units of red blood cell transfusion. Monitor CBC. Hemoglobin is stable. No sign of bleeding.  #Acute kidney injury on CKD stage 3 -Serum creatinine level stable and around baseline. Monitor BMP. Avoid nephrotoxins. -continue sod bicarbonate, increased the dose.  #Hypertension: Blood pressure acceptable. Continue hydralazine, amlodipine.  #Severe protein calorie malnutrition: Dietary referral and encourage oral intake  #Gerd: On protonix.  #Physical deconditioning: PT OT evaluation and supportive care  Principal Problem:   Abdominal pain, lower Active Problems:  Multiple myeloma (HCC)   Essential hypertension   Normochromic normocytic anemia   ARF (acute renal failure) (HCC)   Palliative care by specialist   DVT prophylaxis: Heparin subcutaneous Code Status: Full code Family Communication: No family at bedside Disposition Plan: Likely discharge home in 1-2 days    Consultants:   Palliative care  Procedures: None Antimicrobials: None  Subjective: Seen and examined at bedside. Reported generalized weakness and severe headache. Also has mild nausea. Denied vomiting. No chest pain or shortness of breath or abdominal pain.  Objective: Vitals:   01/14/17 1850 01/14/17 2059 01/15/17 0412 01/15/17 1025  BP: (!) 128/50 (!) 134/44 (!) 136/41 98/73  Pulse: 69 67 62 77  Resp: '16 16 16 18  ' Temp: 98.9 F (37.2 C) 98.1 F (36.7 C) 98.5 F (36.9 C) 99.2 F (37.3 C)  TempSrc: Oral Oral Oral Oral  SpO2: 100% 100% 100% 100%  Weight:      Height:        Intake/Output Summary (Last 24 hours) at 01/15/17 1405 Last data filed at 01/15/17 0600  Gross per 24 hour  Intake              360 ml  Output              300 ml  Net               60 ml   Filed Weights   01/12/17 0524 01/13/17 2235  Weight: 42.3 kg (93 lb 3.2 oz) 44.2 kg (97 lb 6.4 oz)    Examination:  General exam: Looks distress due to  headache Respiratory system: Clear bilaterally, respiratory effort normal Cardiovascular system: Regular rate rhythm, S1-S2 normal. No pedal edema Gastrointestinal system: Abdomen soft, nontender, nondistended. Bowel sound positive Central nervous system: Alert awake and following commands. Muscle  strength 5/5 in all extremities Skin: No rashes, lesions or ulcers Psychiatry: Judgement and insight appear normal. Mood & affect appropriate.     Data Reviewed: I have personally reviewed following labs and imaging studies  CBC:  Recent Labs Lab 01/11/17 2154 01/13/17 0749 01/14/17 0418  WBC 3.7* 3.3* 4.3  HGB 6.8* 8.9* 8.9*  HCT 21.1*  26.6* 27.4*  MCV 99.5 94.3 94.5  PLT 169 139* 468   Basic Metabolic Panel:  Recent Labs Lab 01/11/17 2154 01/13/17 0749 01/14/17 0418 01/15/17 0328  NA 139 139 138 138  K 4.2 3.9 3.8 3.6  CL 113* 115* 113* 115*  CO2 14* 15* 16* 16*  GLUCOSE 103* 90 94 96  BUN 56* 49* 48* 39*  CREATININE 2.75* 1.99* 1.95* 1.57*  CALCIUM 9.6 8.6* 8.5* 8.5*   GFR: Estimated Creatinine Clearance: 18.3 mL/min (A) (by C-G formula based on SCr of 1.57 mg/dL (H)). Liver Function Tests:  Recent Labs Lab 01/11/17 2154  AST 22  ALT 13*  ALKPHOS 71  BILITOT 0.7  PROT 9.2*  ALBUMIN 3.3*    Recent Labs Lab 01/11/17 2154  LIPASE 21   No results for input(s): AMMONIA in the last 168 hours. Coagulation Profile: No results for input(s): INR, PROTIME in the last 168 hours. Cardiac Enzymes: No results for input(s): CKTOTAL, CKMB, CKMBINDEX, TROPONINI in the last 168 hours. BNP (last 3 results) No results for input(s): PROBNP in the last 8760 hours. HbA1C: No results for input(s): HGBA1C in the last 72 hours. CBG: No results for input(s): GLUCAP in the last 168 hours. Lipid Profile: No results for input(s): CHOL, HDL, LDLCALC, TRIG, CHOLHDL, LDLDIRECT in the last 72 hours. Thyroid Function Tests: No results for input(s): TSH, T4TOTAL, FREET4, T3FREE, THYROIDAB in the last 72 hours. Anemia Panel: No results for input(s): VITAMINB12, FOLATE, FERRITIN, TIBC, IRON, RETICCTPCT in the last 72 hours. Sepsis Labs: No results for input(s): PROCALCITON, LATICACIDVEN in the last 168 hours.  No results found for this or any previous visit (from the past 240 hour(s)).       Radiology Studies: Ct Head Wo Contrast  Result Date: 01/15/2017 CLINICAL DATA:  Headache EXAM: CT HEAD WITHOUT CONTRAST TECHNIQUE: Contiguous axial images were obtained from the base of the skull through the vertex without intravenous contrast. COMPARISON:  04/06/2013 FINDINGS: Brain: No acute intracranial abnormality.  Specifically, no hemorrhage, hydrocephalus, mass lesion, acute infarction, or significant intracranial injury. Vascular: No hyperdense vessel or unexpected calcification. Skull: No acute calvarial abnormality. Sinuses/Orbits: Visualized paranasal sinuses are clear. Fluid in the right mastoid air cells. Left mastoid air cells clear. Orbital soft tissues unremarkable. Other: None IMPRESSION: No intracranial abnormality. Right mastoid effusion. Electronically Signed   By: Rolm Baptise M.D.   On: 01/15/2017 10:51        Scheduled Meds: . amLODipine  5 mg Oral Daily  . feeding supplement (ENSURE ENLIVE)  237 mL Oral BID BM  . heparin  5,000 Units Subcutaneous Q8H  . hydrALAZINE  50 mg Oral Q8H  . oxybutynin  5 mg Oral BID  . pantoprazole  40 mg Oral Daily  . sodium bicarbonate  1,300 mg Oral BID   Continuous Infusions:   LOS: 3 days    Arion Morgan Tanna Furry, MD [facility] Pager [phone]  If 7PM-7AM, please contact night-coverage [email or web address] Password [email or web address] 01/15/2017, 2:05 PM

## 2017-01-16 LAB — BASIC METABOLIC PANEL
Anion gap: 11 (ref 5–15)
BUN: 30 mg/dL — AB (ref 6–20)
CALCIUM: 8.9 mg/dL (ref 8.9–10.3)
CO2: 17 mmol/L — AB (ref 22–32)
CREATININE: 1.3 mg/dL — AB (ref 0.44–1.00)
Chloride: 112 mmol/L — ABNORMAL HIGH (ref 101–111)
GFR calc Af Amer: 42 mL/min — ABNORMAL LOW (ref >60)
GFR calc non Af Amer: 36 mL/min — ABNORMAL LOW (ref >60)
GLUCOSE: 107 mg/dL — AB (ref 65–99)
Potassium: 3.4 mmol/L — ABNORMAL LOW (ref 3.5–5.1)
Sodium: 140 mmol/L (ref 135–145)

## 2017-01-16 LAB — CBC
HCT: 27.3 % — ABNORMAL LOW (ref 36.0–46.0)
HEMOGLOBIN: 9 g/dL — AB (ref 12.0–15.0)
MCH: 30.5 pg (ref 26.0–34.0)
MCHC: 33 g/dL (ref 30.0–36.0)
MCV: 92.5 fL (ref 78.0–100.0)
PLATELETS: 106 10*3/uL — AB (ref 150–400)
RBC: 2.95 MIL/uL — ABNORMAL LOW (ref 3.87–5.11)
RDW: 16.3 % — ABNORMAL HIGH (ref 11.5–15.5)
WBC: 3.9 10*3/uL — ABNORMAL LOW (ref 4.0–10.5)

## 2017-01-16 LAB — SEDIMENTATION RATE: Sed Rate: >140 mm/hr — ABNORMAL HIGH (ref 0–22)

## 2017-01-16 LAB — C-REACTIVE PROTEIN

## 2017-01-16 MED ORDER — ONDANSETRON HCL 4 MG/2ML IJ SOLN
4.0000 mg | Freq: Once | INTRAMUSCULAR | Status: AC
  Administered 2017-01-16: 4 mg via INTRAVENOUS
  Filled 2017-01-16: qty 2

## 2017-01-16 MED ORDER — ACETAMINOPHEN 325 MG PO TABS
650.0000 mg | ORAL_TABLET | Freq: Once | ORAL | Status: AC
  Administered 2017-01-16: 650 mg via ORAL

## 2017-01-16 MED ORDER — POTASSIUM CHLORIDE CRYS ER 20 MEQ PO TBCR
40.0000 meq | EXTENDED_RELEASE_TABLET | Freq: Every day | ORAL | Status: AC
  Administered 2017-01-16 – 2017-01-17 (×2): 40 meq via ORAL
  Filled 2017-01-16 (×2): qty 2

## 2017-01-16 MED ORDER — VALPROATE SODIUM 500 MG/5ML IV SOLN
500.0000 mg | Freq: Once | INTRAVENOUS | Status: AC
  Administered 2017-01-16: 500 mg via INTRAVENOUS
  Filled 2017-01-16: qty 5

## 2017-01-16 MED ORDER — POTASSIUM CHLORIDE 10 MEQ/100ML IV SOLN: 10.0000 meq | INTRAVENOUS | Status: DC

## 2017-01-16 MED ORDER — DEXAMETHASONE 4 MG PO TABS
4.0000 mg | ORAL_TABLET | Freq: Every day | ORAL | Status: DC
  Administered 2017-01-16 – 2017-01-19 (×4): 4 mg via ORAL
  Filled 2017-01-16 (×4): qty 1

## 2017-01-16 MED ORDER — OXYCODONE HCL 5 MG PO TABS
5.0000 mg | ORAL_TABLET | Freq: Three times a day (TID) | ORAL | Status: DC
  Administered 2017-01-16 – 2017-01-18 (×6): 5 mg via ORAL
  Filled 2017-01-16 (×6): qty 1

## 2017-01-16 NOTE — Consult Note (Signed)
NEURO HOSPITALIST CONSULT NOTE   Requesting physician: Dr. Carolin Sicks  Reason for Consult: Headache  History obtained from:  Patient and Chart   HPI:                                                                                                                                          Kayla Brooks is an 83 y.o. female with a history of multiple myeloma (not currently on treatment), CKDm hypertension and hyperlipidemia presented [facility] 01/12/17 with complaints of abdominal pain, particularly in the pelvis, and admitted with ARF and anemia. She developed a headache the day following admission (01/13/17) that is located on the left side of her head that "comes and goes". She denies photophonia and photophobia. She denies trouble chewing, scalp pain or new visual problems. Denies any aggravating or alleviating factors for headaches. Denies focal weakness. Denies any new numbness. He reports blurred vision in both her eyes more so in the left eye for the past few months. Denies jaw claudication. Denies chest pain shortness of breath. Reports feeling tired all the time. Reports generalized fatigue and pain.   Past Medical History:  Diagnosis Date  . Arthritis    "right knee" (06/10/2015)  . Chronic anemia   . Chronic diarrhea    Archie Endo 06/10/2015  . GERD (gastroesophageal reflux disease)   . Hyperlipidemia   . Hypertension   . Monoclonal paraproteinemia   . Multiple myeloma without mention of remission   . 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 Pneumonia    "years ago" (06/10/2015)  . Urinary frequency     Past Surgical History:  Procedure Laterality Date  . BONE MARROW BIOPSY     "for multiple myeloma"  . CATARACT EXTRACTION Right   . FEMUR SURGERY Right 01/2008   1.  prophylactic fixation impending pathological fracture of the femur/notes 06/04/2015  . FRACTURE SURGERY    . VAGINAL HYSTERECTOMY     "partial"    Family History   Problem Relation Age of Onset  . Multiple myeloma Neg Hx     Social History:  reports that she has quit smoking. Her smoking use included Cigarettes. She has a 80.00 pack-year smoking history. She has never used smokeless tobacco. She reports that she drinks alcohol. She reports that she does not use drugs.  Allergies  Allergen Reactions  . Morphine And Related Other (See Comments)    Abdominal Pain    MEDICATIONS:  Current Meds  Medication Sig  . amLODipine (NORVASC) 5 MG tablet take 1 tablet by mouth once daily  . aspirin 81 MG tablet Take 1 tablet (81 mg total) by mouth daily.  . diclofenac sodium (VOLTAREN) 1 % GEL Apply 2 g topically 4 (four) times daily. (Patient taking differently: Apply 2 g topically 4 (four) times daily as needed (pain). )  . HYDROcodone-acetaminophen (NORCO/VICODIN) 5-325 MG tablet Take 1 tablet by mouth every 6 (six) hours as needed for moderate pain.  Marland Kitchen ibuprofen (ADVIL,MOTRIN) 600 MG tablet Take 1 tablet (600 mg total) by mouth every 6 (six) hours as needed for moderate pain.  Marland Kitchen lisinopril (PRINIVIL,ZESTRIL) 40 MG tablet take 1 tablet by mouth once daily  . nitroGLYCERIN (NITROSTAT) 0.4 MG SL tablet Place 0.4 mg under the tongue every 5 (five) minutes as needed for chest pain.  Marland Kitchen omeprazole (PRILOSEC) 20 MG capsule take 1 capsule by mouth once daily  . oxybutynin (DITROPAN) 5 MG tablet take 1 tablet by mouth twice a day  . prochlorperazine (COMPAZINE) 10 MG tablet Take 1 tablet (10 mg total) by mouth every 6 (six) hours as needed for nausea or vomiting.  . simvastatin (ZOCOR) 20 MG tablet take 1 tablet by mouth at bedtime     Review Of Systems:                                                                                                           History obtained from the patient  General: Negative for chills, fever; Positive for  fatigue Psychological: Negative for any known behavioral disorder, visual or auditory hallucinations, memory difficulties, mood swings or suicidal ideation Ophthalmic: Negative for blurry or double vision, loss of vision in any field. She has reported chronic problems with seeing out of her right eye that appears unassociated to the headache ENT: Negative for abrupt loss of hearing, tinnitus or vertigo Respiratory: Negative for cough, shortness of breath or wheezing Cardiovascular: Negative for chest pain, dyspnea on exertion, edema Gastrointestinal: Positive for abdominal pain Genito-Urinary: Negative for dysuria Musculoskeletal: Right shoulder swelling Neurological: As noted in HPI Dermatological: Negative for rash or skin changes, numbness or tingling  Blood pressure (!) 146/48, pulse 74, temperature 98.8 F (37.1 C), temperature source Oral, resp. rate 16, height _0  (1.651 m), weight 44.2 kg (97 lb 6.4 oz), SpO2 100 %.   Physical Examination:                                                                                                      General: WDWN female. HEENT:  Normocephalic, no lesions, without obvious abnormality.  Normal external  eye and conjunctiva.  Normal external ears. Normal external nose, mucus membranes and septum.  Normal pharynx. Cardiovascular: S1, S2 normal, pulses palpable throughout   Pulmonary: Clear, unlabored Abdomen: soft, non-tender; bowel sounds normal; no masses,  no organomegaly Extremities: no joint deformities, effusion, or inflammation and no edema Musculoskeletal: Right shoulder pain with a golf ball sized area of fluctuance. Tenderness has been around for several months, not associated with trauma Skin: warm and dry, no hyperpigmentation, vitiligo, or suspicious lesions  Neurological Examination:                                                                                               Mental Status: Kayla Brooks is alert, oriented x 4,  thought content appropriate.  Speech fluent without evidence of aphasia. Able to follow 3-step commands without difficulty. Cranial Nerves: II: Visual fields grossly normal, pupils are equal, round, reactive to light. III,IV, VI: Ptosis not present, extra-ocular muscle movements intact bilaterally V,VII: Smile and eyebrow raise is symmetric. Facial light touch and pinprick sensation intact bilaterally VIII: Hearing grossly intact IX,X: Uvula and palate rise symmetrically XI: SCM and bilateral shoulder shrug strength 5/5 XII: Midline tongue extension Motor: Right :     Upper extremity   5/5   Left:     Upper extremity   5/5          Lower extremity   5/5     Lower extremity   5/5 Sensory: Pinprick and light touch intact throughout, bilaterally Deep Tendon Reflexes: 2+ and symmetric throughout Plantars: Right: downgoing   Left: downgoing Cerebellar: Finger-to-nose test without evidence of dysmetria or ataxia. Coordinated rapid alternating movements. Heel-to-shin test executed within normal limits. Gait: Deffered   Lab Results: Basic Metabolic Panel:  Recent Labs Lab 01/11/17 2154 01/13/17 0749 01/14/17 0418 01/15/17 0328 01/16/17 1202  NA 139 139 138 138 140  K 4.2 3.9 3.8 3.6 3.4*  CL 113* 115* 113* 115* 112*  CO2 14* 15* 16* 16* 17*  GLUCOSE 103* 90 94 96 107*  BUN 56* 49* 48* 39* 30*  CREATININE 2.75* 1.99* 1.95* 1.57* 1.30*  CALCIUM 9.6 8.6* 8.5* 8.5* 8.9    Liver Function Tests:  Recent Labs Lab 01/11/17 2154  AST 22  ALT 13*  ALKPHOS 71  BILITOT 0.7  PROT 9.2*  ALBUMIN 3.3*  CBC:  Recent Labs Lab 01/11/17 2154 01/13/17 0749 01/14/17 0418 01/16/17 1202  WBC 3.7* 3.3* 4.3 3.9*  HGB 6.8* 8.9* 8.9* 9.0*  HCT 21.1* 26.6* 27.4* 27.3*  MCV 99.5 94.3 94.5 92.5  PLT 169 139* 156 106*   Imaging: Reviewed, official report is below. Ct Head Wo Contrast  Result Date: 01/15/2017 CLINICAL DATA:  Headache EXAM: CT HEAD WITHOUT CONTRAST TECHNIQUE: Contiguous axial  images were obtained from the base of the skull through the vertex without intravenous contrast. COMPARISON:  04/06/2013 FINDINGS: Brain: No acute intracranial abnormality. Specifically, no hemorrhage, hydrocephalus, mass lesion, acute infarction, or significant intracranial injury. Vascular: No hyperdense vessel or unexpected calcification. Skull: No acute calvarial abnormality. Sinuses/Orbits: Visualized paranasal sinuses are clear. Fluid in the right mastoid air cells.  Left mastoid air cells clear. Orbital soft tissues unremarkable. Other: None IMPRESSION: No intracranial abnormality. Right mastoid effusion. Electronically Signed   By: Rolm Baptise M.D.   On: 01/15/2017 10:51    Assessment 83 year old female with a past medical history that includes multiple myeloma with increasing fatigue, pelvic pain and new onset headache.  Headache is unlikely temporal arteritis based on a lack of scalp, temporal and masticatory pain, CRP within normal limits, ESR pending.  She has been treated with opioid pain medication since she was admitted, which may be driving her intermittent headache.  Suggest cessation of opioids and migraine cocktail as needed. MRI to rule out any intracranial abnormality. Noncontrast CT of the head has been unremarkable.  Impression Medication overuse headache - likely Rule out intracranial lesions related to multiple myeloma Low suspicion for giant cell arteritis.  Plan: -minimize opioids,  -Would've liked to use Toradol for pain relief IV 1 but given her to arrange renal function, in discussion with the hospitalist, would recommend using IV Depakote 500 mg 1 and Zofran. -MRI brain with and without contrast -Correction of metabolic derangements per primary team -Continue [place] conversations per palliative medicine We will follow this patient with you. Please call with questions  Amie Portland, MD [facility] [phone]  If 7pm to 7am, please call on call as  listed on AMION.

## 2017-01-16 NOTE — Progress Notes (Signed)
Daily Progress Note   Patient Name: Kayla Brooks       Date: 01/16/2017 DOB: [birth year]  Age: 83 y.o. MRN#: [id number] Attending Physician: Rosita Fire, MD Primary Care Physician: Axel Filler, MD Admit Date: 01/11/2017  Reason for Consultation/Follow-up: Establishing goals of care, Pain control and Psychosocial/spiritual support  Subjective: Patient continues to complain of pain, headache as well as back and abdominal pain. Per chart review, she is only received one oxycodone 5 mg in the past 24 hours. She states "she forgets to ask for it". Today I did discuss with her the usage of steroids to address bone pain and today she was agreeable to trying Decadron. She also verbalized willingness to take oxycodone on a scheduled basis with the caveat that she may refuse if she is too sleepy.  She shares with me that her son died 01/21/2017 from stage IV cancer  Length of Stay: 4  Current Medications: Scheduled Meds:  . amLODipine  5 mg Oral Daily  . dexamethasone  4 mg Oral Daily  . feeding supplement (ENSURE ENLIVE)  237 mL Oral BID BM  . heparin  5,000 Units Subcutaneous Q8H  . hydrALAZINE  50 mg Oral Q8H  . methocarbamol  500 mg Oral TID  . oxybutynin  5 mg Oral BID  . oxyCODONE  5 mg Oral Q8H  . pantoprazole  40 mg Oral Daily  . polyethylene glycol  17 g Oral Daily  . senna-docusate  1 tablet Oral BID  . sodium bicarbonate  1,300 mg Oral BID    Continuous Infusions:   PRN Meds: acetaminophen, fentaNYL (SUBLIMAZE) injection, hydrALAZINE, oxyCODONE  Physical Exam  Constitutional: She is oriented to person, place, and time.  Frail elderly female seen sitting in her recliner. In no acute distress  Neck: Normal range of motion.  Cardiovascular: Normal rate.     Pulmonary/Chest: Effort normal.  Musculoskeletal: Normal range of motion.  Neurological: She is alert and oriented to person, place, and time.  Skin: Skin is warm and dry.  Psychiatric: Her behavior is normal. Judgment and thought content normal.  Affect constricted  Nursing note and vitals reviewed.           Vital Signs: BP (!) 146/48 (BP Location: Left Arm)   Pulse 74   Temp 98.8 F (37.1 C) (  Oral)   Resp 16   Ht '5\' 5"'  (1.651 m)   Wt 44.2 kg (97 lb 6.4 oz)   SpO2 100%   BMI 16.21 kg/m  SpO2: SpO2: 100 % O2 Device: O2 Device: Not Delivered O2 Flow Rate:    Intake/output summary:  Intake/Output Summary (Last 24 hours) at 01/16/17 1246 Last data filed at 01/16/17 7619  Gross per 24 hour  Intake              360 ml  Output              650 ml  Net             -290 ml   LBM: Last BM Date: 01/12/17 Baseline Weight: Weight: 42.3 kg (93 lb 3.2 oz) Most recent weight: Weight:  (Pt stayed in recliner all shift.)       Palliative Assessment/Data:    Flowsheet Rows     Most Recent Value  Intake Tab  Unit at Time of Referral  Med/Surg Unit  Palliative Care Primary Diagnosis  Cancer  Date Notified  01/13/17  Palliative Care Type  New Palliative care  Reason for referral  Pain, Clarify Goals of Care  Date of Admission  01/11/17  # of days IP prior to Palliative referral  2  Clinical Assessment  Palliative Performance Scale Score  40%  Psychosocial & Spiritual Assessment  Palliative Care Outcomes      Patient Active Problem List   Diagnosis Date Noted  . Intractable headache   . Palliative care by specialist   . Abdominal pain, lower 01/12/2017  . Normochromic normocytic anemia 01/12/2017  . ARF (acute renal failure) ([place]) 01/12/2017  . Acute kidney injury (nontraumatic) ([place])   . Healthcare maintenance 01/02/2015  . Urge incontinence 12/11/2014  . Multiple myeloma ([place]) 03/27/2009  . Essential hypertension 05/04/2006  . GERD (gastroesophageal reflux disease)  05/04/2006    Palliative Care Assessment & Plan   Patient Profile: Kayla Brooks a 83 y.o.femalewith history of multiple myeloma presently not on treatment since patient was not able to tolerate last November 2017. She also has chronic kidney disease, hypertension and anemia. She presents to the ER with complaints of worsening pain in the abdomen particularly on the pelvic bones. Patient's pain has been increasing progressively over the last few weeks and last 2-3 weeks and hardly has been able to ambulate. Denies any nausea vomiting or diarrhea. Denies any chest pain or shortness of breath.Imaging revealed likely progression of myeloma. She has been followed by Dr. Alen Blew and declined further treatment. Palliative consulted to assist in clarifying goals of care   Patient continues to be very resistant to discussing her illness in any depth as well as goals of care  Recommendations/Plan:  Pain: As patient likely has a significant degree of bone pain will start Decadron 4 mg daily. Also will schedule low-dose oxycodone, 5 mg every 8 hours as patient is forgetting to ask for medicine on an as-needed basis. Continue with Robaxin as started by attending.  Goals of Care and Additional Recommendations:  Limitations on Scope of Treatment: Full Scope Treatment  Code Status:    Code Status Orders        Start     Ordered   01/12/17 0300  Full code  Continuous     01/12/17 0300    Code Status History    Date Active Date Inactive Code Status Order ID Comments User Context   06/10/2015  6:03 PM  06/11/2015  7:37 PM Full Code [id number]  Milagros Loll, MD ED       Prognosis:   Unable to determine  Discharge Planning:  To Be Determined  Care plan was discussed with Dr. Carolin Sicks  Thank you for allowing the Palliative Medicine Team to assist in the care of this patient.   Time In: 1200 Time Out: 1225 Total Time 25 min Prolonged Time Billed  no       Greater than 50%  of  this time was spent counseling and coordinating care related to the above assessment and plan.  Dory Horn, NP  Please contact Palliative Medicine Team phone at [phone] for questions and concerns.

## 2017-01-16 NOTE — Progress Notes (Addendum)
PROGRESS NOTE    Kayla Brooks  WRU:[id number] DOB: 01-03-1930 DOA: 01/11/2017 PCP: Axel Filler, MD   Brief Narrative: 83 year old female with history of multiple myeloma followed up with oncologist, Dr. not further chemotherapy, hypertension, chronic kidney disease, anemia, bony metastasis to the pelvis presented with worsening abdominal pain and pelvic bones. Her pain is progressively worsened over last few weeks. She has difficulty emanating now because of the pain is associated with decreasing oral intake. Palliative care consult was requested.  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 -Pain management with oxycodone and IV fentanyl as needed. -continue Robaxin -Palliative care consult appreciated.  -PT OT therapy and supportive care. -Follow up with oncologist outpatient.  # Intractable headache: Patient reported pain mostly on the left side. She has no focal neurological deficit. Continue Tylenol, oxycodone and fentanyl as needed. Patient reported a chronic issue with her vision is unchanged recently. I ordered ESR, CRP. Discussed with the neurology for the consult. Recommended MRI of the brain. MRI ordered. Further evaluation and management defer to neurology team.  #Symptomatic anemia: Status post 2 units of red blood cell transfusion. Monitor CBC. Hemoglobin is stable. No sign of bleeding. Check CBC today  #Acute kidney injury on CKD stage 3 -Serum creatinine level stable and around baseline. Monitor BMP. Avoid nephrotoxins. -continue sod bicarbonate, increased the dose. -Repeat BMP today  #Hypertension: Blood pressure acceptable. Continue hydralazine, amlodipine.  #Severe protein calorie malnutrition: Dietary referral and encourage oral intake  #Gerd: On protonix.  #Physical deconditioning: PT OT  evaluation and supportive care  Principal Problem:   Abdominal pain, lower Active Problems:   Multiple myeloma ([facility])   Essential hypertension   Normochromic normocytic anemia   ARF (acute renal failure) ([facility])   Palliative care by specialist   Intractable headache   DVT prophylaxis: SCD, thrombocytopenia Code Status: Full code Family Communication: No family at bedside Disposition Plan: Likely discharge home in 1-2 days    Consultants:   Palliative care  Neurology  Procedures: None Antimicrobials: None  Subjective: Seen and examined at bedside. Reported severe headache mostly in the left side. She felt better for a few hours yesterday afternoon. Has mild nausea but denied vomiting or abdominal pain, dysuria or urgency.    Objective: Vitals:   01/15/17 1025 01/15/17 2300 01/16/17 0512 01/16/17 0900  BP: 98/73 (!) 139/58 (!) 161/54 (!) 146/48  Pulse: 77 69 84 74  Resp: '18 16 16 16  ' Temp: 99.2 F (37.3 C) 98.7 F (37.1 C) 98.6 F (37 C) 98.8 F (37.1 C)  TempSrc: Oral Oral Oral Oral  SpO2: 100% 100% 99% 100%  Weight:      Height:        Intake/Output Summary (Last 24 hours) at 01/16/17 1241 Last data filed at 01/16/17 0513  Gross per 24 hour  Intake              360 ml  Output              650 ml  Net             -290 ml   Filed Weights   01/12/17 0524 01/13/17 2235  Weight: 42.3 kg (93 lb 3.2 oz) 44.2 kg (97 lb 6.4 oz)    Examination:  General exam: Mild distress because of headache, sitting on chair Respiratory system: Clear  bilateral, respiratory effort normal Cardiovascular system: Regular rate rhythm, S1 is normal. No pedal edema Gastrointestinal system: Abdomen soft, nontender, nondistended. Thousand positive Central nervous system: Alert awake and following commands. Muscle strength 5 over 5 in all extremities. Skin: No rashes, lesions or ulcers Psychiatry: Judgement and insight appear normal. Mood & affect appropriate.     Data Reviewed: I  have personally reviewed following labs and imaging studies  CBC:  Recent Labs Lab 01/11/17 2154 01/13/17 0749 01/14/17 0418  WBC 3.7* 3.3* 4.3  HGB 6.8* 8.9* 8.9*  HCT 21.1* 26.6* 27.4*  MCV 99.5 94.3 94.5  PLT 169 139* 235   Basic Metabolic Panel:  Recent Labs Lab 01/11/17 2154 01/13/17 0749 01/14/17 0418 01/15/17 0328  NA 139 139 138 138  K 4.2 3.9 3.8 3.6  CL 113* 115* 113* 115*  CO2 14* 15* 16* 16*  GLUCOSE 103* 90 94 96  BUN 56* 49* 48* 39*  CREATININE 2.75* 1.99* 1.95* 1.57*  CALCIUM 9.6 8.6* 8.5* 8.5*   GFR: Estimated Creatinine Clearance: 18.3 mL/min (A) (by C-G formula based on SCr of 1.57 mg/dL (H)). Liver Function Tests:  Recent Labs Lab 01/11/17 2154  AST 22  ALT 13*  ALKPHOS 71  BILITOT 0.7  PROT 9.2*  ALBUMIN 3.3*    Recent Labs Lab 01/11/17 2154  LIPASE 21   No results for input(s): AMMONIA in the last 168 hours. Coagulation Profile: No results for input(s): INR, PROTIME in the last 168 hours. Cardiac Enzymes: No results for input(s): CKTOTAL, CKMB, CKMBINDEX, TROPONINI in the last 168 hours. BNP (last 3 results) No results for input(s): PROBNP in the last 8760 hours. HbA1C: No results for input(s): HGBA1C in the last 72 hours. CBG: No results for input(s): GLUCAP in the last 168 hours. Lipid Profile: No results for input(s): CHOL, HDL, LDLCALC, TRIG, CHOLHDL, LDLDIRECT in the last 72 hours. Thyroid Function Tests: No results for input(s): TSH, T4TOTAL, FREET4, T3FREE, THYROIDAB in the last 72 hours. Anemia Panel: No results for input(s): VITAMINB12, FOLATE, FERRITIN, TIBC, IRON, RETICCTPCT in the last 72 hours. Sepsis Labs: No results for input(s): PROCALCITON, LATICACIDVEN in the last 168 hours.  No results found for this or any previous visit (from the past 240 hour(s)).       Radiology Studies: Ct Head Wo Contrast  Result Date: 01/15/2017 CLINICAL DATA:  Headache EXAM: CT HEAD WITHOUT CONTRAST TECHNIQUE: Contiguous  axial images were obtained from the base of the skull through the vertex without intravenous contrast. COMPARISON:  04/06/2013 FINDINGS: Brain: No acute intracranial abnormality. Specifically, no hemorrhage, hydrocephalus, mass lesion, acute infarction, or significant intracranial injury. Vascular: No hyperdense vessel or unexpected calcification. Skull: No acute calvarial abnormality. Sinuses/Orbits: Visualized paranasal sinuses are clear. Fluid in the right mastoid air cells. Left mastoid air cells clear. Orbital soft tissues unremarkable. Other: None IMPRESSION: No intracranial abnormality. Right mastoid effusion. Electronically Signed   By: Rolm Baptise M.D.   On: 01/15/2017 10:51        Scheduled Meds: . amLODipine  5 mg Oral Daily  . feeding supplement (ENSURE ENLIVE)  237 mL Oral BID BM  . heparin  5,000 Units Subcutaneous Q8H  . hydrALAZINE  50 mg Oral Q8H  . methocarbamol  500 mg Oral TID  . oxybutynin  5 mg Oral BID  . pantoprazole  40 mg Oral Daily  . polyethylene glycol  17 g Oral Daily  . senna-docusate  1 tablet Oral BID  . sodium bicarbonate  1,300 mg Oral  BID   Continuous Infusions:   LOS: 4 days    Yunus Stoklosa Tanna Furry, MD [facility] Pager [phone]  If 7PM-7AM, please contact night-coverage [email or web address] Password [facility] 01/16/2017, 12:41 PM

## 2017-01-17 ENCOUNTER — Inpatient Hospital Stay (HOSPITAL_COMMUNITY): Payer: Medicare Other

## 2017-01-17 DIAGNOSIS — N170 Acute kidney failure with tubular necrosis: Secondary | ICD-10-CM

## 2017-01-17 DIAGNOSIS — R1030 Lower abdominal pain, unspecified: Secondary | ICD-10-CM

## 2017-01-17 MED ORDER — FENTANYL 12 MCG/HR TD PT72
12.5000 ug | MEDICATED_PATCH | TRANSDERMAL | Status: DC
  Administered 2017-01-17: 12.5 ug via TRANSDERMAL
  Filled 2017-01-17: qty 1

## 2017-01-17 MED ORDER — FENTANYL 25 MCG/HR TD PT72: 25.0000 ug | MEDICATED_PATCH | TRANSDERMAL | Status: DC

## 2017-01-17 NOTE — Progress Notes (Signed)
PROGRESS NOTE    Kayla Brooks  RNH:[id number] DOB: 1930/02/18 DOA: 01/11/2017 PCP: Axel Filler, MD   Brief Narrative: 83 year old female with history of multiple myeloma followed up with oncologist, Dr. not further chemotherapy, hypertension, chronic kidney disease, anemia, bony metastasis to the pelvis presented with worsening abdominal pain and pelvic bones. Her pain is progressively worsened over last few weeks. She has difficulty ambulating now because of the pain is associated with decreasing oral intake. Palliative care consult was requested.  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 -Pain management with oxycodone and IV fentanyl as needed. Patient also started on steroids, currently on Decadron 4 mg a day -continue Robaxin, add fentanyl patch  -Palliative care consult- we will need to define goals of care prior to discharge -PT OT therapy -home health -Follow up with oncologist outpatient.   # Intractable headache: Patient reported pain mostly on the left side. She has no focal neurological deficit. Continue Tylenol, oxycodone and fentanyl as needed. Patient reported a chronic issue with her vision is unchanged recently. ESR markedly elevated greater than 140, likely the setting of multiple myeloma. Discussed with the neurology for the consult. Recommended MRI of the brain. MRI ordered. Further evaluation and management defer to neurology team.  #Symptomatic anemia: Status post 2 units of red blood cell transfusion. Monitor CBC. Hemoglobin is stable. No sign of bleeding. Check CBC today  #Acute kidney injury on CKD stage 3, likely prerenal presenting creatinine 2.75, now 1.3 -Serum creatinine level stable and around baseline. Monitor BMP. Avoid nephrotoxins. -continue sod bicarbonate, increased the dose.     #Hypertension: Blood pressure acceptable. Continue hydralazine, amlodipine.  #Severe protein calorie malnutrition: Dietary referral and encourage oral intake  #Gerd: On protonix.  #Physical deconditioning: PT OT evaluation-home health    Principal Problem:   Abdominal pain, lower Active Problems:   Multiple myeloma (HCC)   Essential hypertension   Normochromic normocytic anemia   ARF (acute renal failure) ([facility])   Palliative care by specialist   Intractable headache   DVT prophylaxis: SCD, thrombocytopenia Code Status: Full code Family Communication: No family at bedside Disposition Plan: Likely discharge home in 1-2 days    Consultants:   Palliative care  Neurology  Procedures: None Antimicrobials: None  Subjective: Feels pain control is better,no HA ,  Has mild nausea but denied vomiting or abdominal pain, dysuria or urgency.    Objective: Vitals:   01/16/17 2026 01/17/17 0558 01/17/17 0800 01/17/17 0823  BP: 127/66 (!) 163/71 (!) 148/61 (!) 150/75  Pulse: (!) 58 73 79 80  Resp: '15 14 16 17  ' Temp: 98 F (36.7 C) 97.8 F (36.6 C) 98.1 F (36.7 C) 97.8 F (36.6 C)  TempSrc:   Oral Oral  SpO2: 100% 100% 100% 100%  Weight:      Height:        Intake/Output Summary (Last 24 hours) at 01/17/17 1005 Last data filed at 01/17/17 0824  Gross per 24 hour  Intake              535 ml  Output              500 ml  Net               35 ml   Filed Weights   01/12/17 0524 01/13/17 2235  Weight: 42.3 kg (93  lb 3.2 oz) 44.2 kg (97 lb 6.4 oz)    Examination:  General exam: Mild distress because of headache, sitting on chair Respiratory system: Clear bilateral, respiratory effort normal Cardiovascular system: Regular rate rhythm, S1 is normal. No pedal edema Gastrointestinal system: Abdomen soft, nontender, nondistended. Thousand positive Central nervous system: Alert awake and following commands. Muscle strength 5 over 5 in all extremities. Skin: No  rashes, lesions or ulcers Psychiatry: Judgement and insight appear normal. Mood & affect appropriate.     Data Reviewed: I have personally reviewed following labs and imaging studies  CBC:  Recent Labs Lab 01/11/17 2154 01/13/17 0749 01/14/17 0418 01/16/17 1202  WBC 3.7* 3.3* 4.3 3.9*  HGB 6.8* 8.9* 8.9* 9.0*  HCT 21.1* 26.6* 27.4* 27.3*  MCV 99.5 94.3 94.5 92.5  PLT 169 139* 156 948*   Basic Metabolic Panel:  Recent Labs Lab 01/11/17 2154 01/13/17 0749 01/14/17 0418 01/15/17 0328 01/16/17 1202  NA 139 139 138 138 140  K 4.2 3.9 3.8 3.6 3.4*  CL 113* 115* 113* 115* 112*  CO2 14* 15* 16* 16* 17*  GLUCOSE 103* 90 94 96 107*  BUN 56* 49* 48* 39* 30*  CREATININE 2.75* 1.99* 1.95* 1.57* 1.30*  CALCIUM 9.6 8.6* 8.5* 8.5* 8.9   GFR: Estimated Creatinine Clearance: 22.1 mL/min (A) (by C-G formula based on SCr of 1.3 mg/dL (H)). Liver Function Tests:  Recent Labs Lab 01/11/17 2154  AST 22  ALT 13*  ALKPHOS 71  BILITOT 0.7  PROT 9.2*  ALBUMIN 3.3*    Recent Labs Lab 01/11/17 2154  LIPASE 21   No results for input(s): AMMONIA in the last 168 hours. Coagulation Profile: No results for input(s): INR, PROTIME in the last 168 hours. Cardiac Enzymes: No results for input(s): CKTOTAL, CKMB, CKMBINDEX, TROPONINI in the last 168 hours. BNP (last 3 results) No results for input(s): PROBNP in the last 8760 hours. HbA1C: No results for input(s): HGBA1C in the last 72 hours. CBG: No results for input(s): GLUCAP in the last 168 hours. Lipid Profile: No results for input(s): CHOL, HDL, LDLCALC, TRIG, CHOLHDL, LDLDIRECT in the last 72 hours. Thyroid Function Tests: No results for input(s): TSH, T4TOTAL, FREET4, T3FREE, THYROIDAB in the last 72 hours. Anemia Panel: No results for input(s): VITAMINB12, FOLATE, FERRITIN, TIBC, IRON, RETICCTPCT in the last 72 hours. Sepsis Labs: No results for input(s): PROCALCITON, LATICACIDVEN in the last 168 hours.  No results  found for this or any previous visit (from the past 240 hour(s)).       Radiology Studies: Ct Head Wo Contrast  Result Date: 01/15/2017 CLINICAL DATA:  Headache EXAM: CT HEAD WITHOUT CONTRAST TECHNIQUE: Contiguous axial images were obtained from the base of the skull through the vertex without intravenous contrast. COMPARISON:  04/06/2013 FINDINGS: Brain: No acute intracranial abnormality. Specifically, no hemorrhage, hydrocephalus, mass lesion, acute infarction, or significant intracranial injury. Vascular: No hyperdense vessel or unexpected calcification. Skull: No acute calvarial abnormality. Sinuses/Orbits: Visualized paranasal sinuses are clear. Fluid in the right mastoid air cells. Left mastoid air cells clear. Orbital soft tissues unremarkable. Other: None IMPRESSION: No intracranial abnormality. Right mastoid effusion. Electronically Signed   By: Rolm Baptise M.D.   On: 01/15/2017 10:51        Scheduled Meds: . amLODipine  5 mg Oral Daily  . dexamethasone  4 mg Oral Daily  . feeding supplement (ENSURE ENLIVE)  237 mL Oral BID BM  . hydrALAZINE  50 mg Oral Q8H  . methocarbamol  500  mg Oral TID  . oxybutynin  5 mg Oral BID  . oxyCODONE  5 mg Oral Q8H  . pantoprazole  40 mg Oral Daily  . polyethylene glycol  17 g Oral Daily  . potassium chloride  40 mEq Oral Daily  . senna-docusate  1 tablet Oral BID  . sodium bicarbonate  1,300 mg Oral BID   Continuous Infusions:   LOS: 5 days    Reyne Dumas, MD [facility]    If 7PM-7AM, please contact night-coverage [email or web address] Password [email or web address] 01/17/2017, 10:05 AM

## 2017-01-17 NOTE — Progress Notes (Signed)
Daily Progress Note   Patient Name: Kayla Brooks       Date: 01/17/2017 DOB: Nov 07, 1929  Age: 83 y.o. MRN#: [id number] Attending Physician: Reyne Dumas, MD Primary Care Physician: Axel Filler, MD Admit Date: 01/11/2017  Reason for Consultation/Follow-up: Establishing goals of care, Pain control and Psychosocial/spiritual support  Subjective: Kayla Brooks was sitting in the bedside chair on my arrival to her room. She reports improvement in her pain, and related that she has intermittently felt "like a queen." While her pain has previously been 10/10, she now feels like it is 6/10. She feel sthis is a reasonable level of pain for her, and has been happy to also have periods of minimal pain around 2/10. She is very appreciative of her pain improvement.   Length of Stay: 5  Current Medications: Scheduled Meds:  . amLODipine  5 mg Oral Daily  . dexamethasone  4 mg Oral Daily  . feeding supplement (ENSURE ENLIVE)  237 mL Oral BID BM  . fentaNYL  12.5 mcg Transdermal Q72H  . hydrALAZINE  50 mg Oral Q8H  . methocarbamol  500 mg Oral TID  . oxybutynin  5 mg Oral BID  . oxyCODONE  5 mg Oral Q8H  . pantoprazole  40 mg Oral Daily  . polyethylene glycol  17 g Oral Daily  . senna-docusate  1 tablet Oral BID  . sodium bicarbonate  1,300 mg Oral BID    Continuous Infusions:   PRN Meds: acetaminophen, fentaNYL (SUBLIMAZE) injection, hydrALAZINE, oxyCODONE  Physical Exam  Constitutional: She is oriented to person, place, and time.  Frail elderly female seen sitting in her recliner. In no acute distress  Neck: Normal range of motion.  Cardiovascular: Normal rate.   Pulmonary/Chest: Effort normal.  Musculoskeletal: Normal range of motion.  Neurological: She is alert and oriented to  person, place, and time.  Skin: Skin is warm and dry.  Psychiatric: She has a normal mood and affect. Her speech is normal and behavior is normal. Judgment and thought content normal. Cognition and memory are normal.  More socially interactive  Nursing note and vitals reviewed.           Vital Signs: BP (!) 150/75 (BP Location: Left Arm)   Pulse 80   Temp 97.8 F (36.6 C) (Oral)   Resp 17  Ht _0  (1.651 m)   Wt 44.2 kg (97 lb 6.4 oz)   SpO2 100%   BMI 16.21 kg/m  SpO2: SpO2: 100 % O2 Device: O2 Device: Not Delivered O2 Flow Rate:    Intake/output summary:   Intake/Output Summary (Last 24 hours) at 01/17/17 1222 Last data filed at 01/17/17 0915  Gross per 24 hour  Intake              655 ml  Output              250 ml  Net              405 ml   LBM: Last BM Date: 01/16/17 Baseline Weight: Weight: 42.3 kg (93 lb 3.2 oz) Most recent weight: Weight:  (Pt stayed in recliner all shift.)  Palliative Assessment/Data: PPS 50-60%   Flowsheet Rows     Most Recent Value  Intake Tab  Unit at Time of Referral  Med/Surg Unit  Palliative Care Primary Diagnosis  Cancer  Date Notified  01/13/17  Palliative Care Type  New Palliative care  Reason for referral  Pain, Clarify Goals of Care  Date of Admission  01/11/17  # of days IP prior to Palliative referral  2  Clinical Assessment  Palliative Performance Scale Score  40%  Psychosocial & Spiritual Assessment  Palliative Care Outcomes      Patient Active Problem List   Diagnosis Date Noted  . Intractable headache   . Palliative care by specialist   . Abdominal pain, lower 01/12/2017  . Normochromic normocytic anemia 01/12/2017  . ARF (acute renal failure) ([place]) 01/12/2017  . Acute kidney injury (nontraumatic) ([place])   . Healthcare maintenance 01/02/2015  . Urge incontinence 12/11/2014  . Multiple myeloma ([place]) 03/27/2009  . Essential hypertension 05/04/2006  . GERD (gastroesophageal reflux disease) 05/04/2006     Palliative Care Assessment & Plan   Patient Profile: Kayla Brooks a 83 y.o.femalewith history of multiple myeloma presently not on treatment since patient was not able to tolerate last November 2017. She also has chronic kidney disease, hypertension and anemia. She presents to the ER with complaints of worsening pain in the abdomen particularly on the pelvic bones. Patient's pain has been increasing progressively over the last few weeks and last 2-3 weeks and hardly has been able to ambulate. Denies any nausea vomiting or diarrhea. Denies any chest pain or shortness of breath.Imaging revealed likely progression of myeloma. She has been followed by Dr. Alen Blew and declined further treatment. Palliative consulted to assist in clarifying goals of care   Kayla Brooks is much more socially interactive today. She has had a good response to the steroid and scheduled Oxycodone, and reports her pain is under much better control. I discussed the option of adjusting her pain medication today, however she felt comfortable at present and did not want to adjust it.   I did bring up the necessity of considering the plan for after she leaves the hospital. She reports good family and friend support, and she plans to return home and rely on them for her needs. As OT is recommending 24hr support, I queried her capacity to set that up. She would not go into any specifics, but said she would have the support she needed. She did not want me to call any friends or family to confirm their commitment to helping her. I also tried to discuss the overall plan for managing her health, especially in light of her progressive  disease and symptoms. She felt "too tired" to discuss that with me, and asked that I let her rest. I stressed the importance of advanced care planning, but she pleasantly dismissed me from the room.   Recommendations/Plan:  Pain: I would recommend continuing decadron and scheduled Oxycodone and  Robaxin. If her pain remains well controlled on current regimen, can consider transitioning tomorrow to Oxycontin and Oxycodone PRN for breakthrough. I would be hesitant to use transdermal Fentanyl given her lack of body fat and the potential for inadequate serum concentrations.  GOC: She remains unwilling to discuss her overall health and advanced care planning needs. This is the fourth time our team has attempted to engage her in this discussion. I will not push the issue given her clear reticence. I suspect she will need a f/u visit with Oncology to facilitate these discussions. At present, she plans to return home with family/friend support and home health.   Goals of Care and Additional Recommendations:  Limitations on Scope of Treatment: Full Scope Treatment  Code Status:    Code Status Orders        Start     Ordered   01/12/17 0300  Full code  Continuous     01/12/17 0300    Code Status History    Date Active Date Inactive Code Status Order ID Comments User Context   06/10/2015  6:03 PM 06/11/2015  7:37 PM Full Code [id number]  Milagros Loll, MD ED       Prognosis:   < 6 months in the setting of progressive multiple myeloma for which no treatment is being sought.   Discharge Planning:  Home with [facility] was discussed with pt.   Thank you for allowing the Palliative Medicine Team to assist in the care of this patient.  Total time: 25 minutes    Greater than 50%  of this time was spent counseling and coordinating care related to the above assessment and plan.  Jannette Fogo, NP  Please contact Palliative Medicine Team phone at [phone] for questions and concerns.

## 2017-01-17 NOTE — Evaluation (Signed)
Physical Therapy Evaluation Patient Details Name: Kayla Brooks MRN: [id number] DOB: [birth year] Today's Date: 01/17/2017   History of Present Illness  83 y.o. female presents to the ER with complaints of worsening pain in the abdomen particularly on the pelvic bones. PMH including chronic kidney disease, hypertension, anemia, and history of multiple myeloma presently not on treatment since patient was not able to tolerate last November 2017.  Clinical Impression  Pt presents with abdominal pain secondary to metastatic multiple myeloma presently not on treatment. Pt was awake and alert, and cooperative with treatment. Pt required minimal assistance for transfers and ambulation for safety and stability. Spoke with patient about home care service options available. Pt will benefit from continued skilled physical therapy to improve strength and endurance for safety and functional mobility.     Follow Up Recommendations Supervision/Assistance - 24 hour;Home health PT;Other (comment) [facility] RN)    Equipment Recommendations       Recommendations for Other Services       Precautions / Restrictions Precautions Precautions: Fall Restrictions Weight Bearing Restrictions: No      Mobility  Bed Mobility               General bed mobility comments: In recliner upon arrival  Transfers Overall transfer level: Needs assistance Equipment used: Rolling walker (2 wheeled) Transfers: Sit to/from Stand Sit to Stand: +2 safety/equipment;Min assist         General transfer comment: Min A to power up to standing  Ambulation/Gait Ambulation/Gait assistance: Min assist;+2 safety/equipment   Assistive device: Rolling walker (2 wheeled) Gait Pattern/deviations: Narrow base of support;Decreased step length - right;Decreased step length - left    Comments: Decreased gait velocity for age. Left foot hit right heel during step through. Upright posture. Appreciated being up and walking.      Stairs            Wheelchair Mobility    Modified Rankin (Stroke Patients Only)       Balance Overall balance assessment: Needs assistance Sitting-balance support: No upper extremity supported Sitting balance-Leahy Scale: Good     Standing balance support: Bilateral upper extremity supported Standing balance-Leahy Scale: Poor Standing balance comment: Balanced well with bilateral UE support of RW                             Pertinent Vitals/Pain Pain Location: Left shoulder   Pain Level: 7 on 0-10 scale Pain Management: Limited activity within pain tolerance    Home Living Family/patient expects to be discharged to:: Private residence Living Arrangements: Alone Available Help at Discharge: Family;Friend(s) Type of Home: Apartment Home Access: Level entry     Home Layout: One level Home Equipment: Walker - 2 wheels;Wheelchair - manual;Bedside commode;Hand held shower head;Cane - single point      Prior Function Level of Independence: Independent         Comments: ADLs, IADLs, sons will help with harder tasks     Hand Dominance        Extremity/Trunk Assessment                Communication   Communication: HOH  Cognition Arousal/Alertness: Awake/alert Behavior During Therapy: WFL for tasks assessed/performed Overall Cognitive Status: Within Functional Limits for tasks assessed  General Comments      Exercises     Assessment/Plan    PT Assessment Patient needs continued PT services  PT Problem List Decreased strength;Decreased activity tolerance;Decreased balance;Decreased mobility;Decreased safety awareness;Pain       PT Treatment Interventions DME instruction;Gait training;Functional mobility training;Balance training;Therapeutic exercise;Therapeutic activities;Patient/family education    PT Goals (Current goals can be found in the Care Plan section)        Frequency Min 3X/week   Barriers to discharge        Co-evaluation               AM-PAC PT "6 Clicks" Daily Activity  Outcome Measure Difficulty turning over in bed (including adjusting bedclothes, sheets and blankets)?: A Little Difficulty moving from lying on back to sitting on the side of the bed? : A Lot Difficulty sitting down on and standing up from a chair with arms (e.g., wheelchair, bedside commode, etc,.)?: Total Help needed moving to and from a bed to chair (including a wheelchair)?: A Little Help needed walking in hospital room?: A Little Help needed climbing 3-5 steps with a railing? : A Lot 6 Click Score: 14    End of Session Equipment Utilized During Treatment: Gait belt Activity Tolerance: Patient tolerated treatment well Patient left: in chair;with call bell/phone within reach   PT Visit Diagnosis: Muscle weakness (generalized) (M62.81);Pain;Unsteadiness on feet (R26.81) Pain - Right/Left: Left Pain - part of body: Shoulder    Time:  -      Charges:       01/17/17 0943  PT Time Calculation  PT Start Time (ACUTE ONLY) 0943  PT Stop Time (ACUTE ONLY) 1000  PT Time Calculation (min) (ACUTE ONLY) 17 min  PT General Charges  $$ ACUTE PT VISIT 1 Procedure  PT Evaluation  $PT Eval Low Complexity 1 Procedure        PT G Codes:        Sandria Bales, SPT Acute Rehabilitation Services Office: [phone]  Sandria Bales 01/17/2017, 2:28 PM

## 2017-01-17 NOTE — Evaluation (Signed)
Occupational Therapy Evaluation Patient Details Name: Kayla Brooks MRN: [id number] DOB: 05-May-1930 Today's Date: 01/17/2017    History of Present Illness 83 y.o. female presents to the ER with complaints of worsening pain in the abdomen particularly on the pelvic bones. PMH including chronic kidney disease, hypertension, anemia, and history of multiple myeloma presently not on treatment since patient was not able to tolerate last November 2017.   Clinical Impression   PTA, pt reports that she was living alone and performing her ADLs and IADLs. Pt currently requiring Min A for LB ADLs and functional mobility with RW. Pt demonstrating overall decreased activity tolerance and quickly fatigues during ADLs. Pt states she has good family and friend support that can be available at DC. Feel pt would requiring initial 24 hour supervision/assistance at dc due to decreased activity tolerance. Recommend dc with HHOT to increase pt safety and independence with ADLs and functional mobility.      Follow Up Recommendations  Home health OT;Supervision/Assistance - 24 hour    Equipment Recommendations  Tub/shower bench    Recommendations for Other Services PT consult     Precautions / Restrictions Restrictions Weight Bearing Restrictions: No      Mobility Bed Mobility               General bed mobility comments: In recliner upon arrival  Transfers Overall transfer level: Needs assistance Equipment used: Rolling walker (2 wheeled) Transfers: Sit to/from Stand Sit to Stand: Min assist         General transfer comment: Min A to power up into standing    Balance Overall balance assessment: Needs assistance Sitting-balance support: No upper extremity supported;Feet supported Sitting balance-Leahy Scale: Good     Standing balance support: Single extremity supported;During functional activity Standing balance-Leahy Scale: Poor Standing balance comment: Pt requiring atleast one hand  on surface for UE support and balance                           ADL either performed or assessed with clinical judgement   ADL Overall ADL's : Needs assistance/impaired Eating/Feeding: Set up;Sitting   Grooming: Wash/dry face;Applying deodorant;Sitting;Min guard Grooming Details (indicate cue type and reason): Pt performed grooming at sink while seated. Pt stated she is very fatigued and doesnt think she could stand at sink for grooming.  Upper Body Bathing: Min guard;Sitting Upper Body Bathing Details (indicate cue type and reason): Pt washed UB while seated at sink Lower Body Bathing: Minimal assistance;Sit to/from stand Lower Body Bathing Details (indicate cue type and reason): Min A for balance while pt washed peri areas while standing at sink Upper Body Dressing : Set up;Supervision/safety;Sitting   Lower Body Dressing: Sit to/from stand;Minimal assistance Lower Body Dressing Details (indicate cue type and reason): Pt donned new pair of socks with Min A to adjust once she had them on.  Toilet Transfer: Minimal assistance;RW;Ambulation (Simulated at recliner)           Functional mobility during ADLs: Minimal assistance;Rolling walker General ADL Comments: Pt requiring Min A  for LB ADLs and functional mobility due to increased fatuigue with activity. Pt would benefit from sueprvision/assistance at home due to decreased activity tolerance     Vision Baseline Vision/History: Wears glasses Wears Glasses: At all times Patient Visual Report: No change from baseline       Perception     Praxis      Pertinent Vitals/Pain Pain Assessment: No/denies pain  Hand Dominance Right   Extremity/Trunk Assessment Upper Extremity Assessment Upper Extremity Assessment: Generalized weakness;RUE deficits/detail RUE Deficits / Details: Decreased ROM in R shoulder. Forwarf shoulder flex 0-60 and then with compensatory movements to increase ROM. RUE Coordination: decreased  gross motor   Lower Extremity Assessment Lower Extremity Assessment: Defer to PT evaluation   Cervical / Trunk Assessment Cervical / Trunk Assessment: Normal   Communication Communication Communication: HOH;Other (comment) (Some expressive difficulties. Pt needing increased time)   Cognition Arousal/Alertness: Awake/alert Behavior During Therapy: WFL for tasks assessed/performed Overall Cognitive Status: Within Functional Limits for tasks assessed                                     General Comments  Pt still unsure of dc plan     Exercises     Shoulder Instructions      Home Living Family/patient expects to be discharged to:: Private residence Living Arrangements: Alone Available Help at Discharge: Family;Friend(s) Type of Home: Apartment Home Access: Level entry     Home Layout: One level     Bathroom Shower/Tub: Tub/shower unit;Curtain   Biochemist, clinical: Standard     Home Equipment: Environmental consultant - 2 wheels;Wheelchair - manual;Bedside commode;Hand held shower head;Cane - single point          Prior Functioning/Environment Level of Independence: Independent        Comments: ADLs, IADLs, sons will help with harder tasks        OT Problem List: Decreased strength;Decreased range of motion;Decreased activity tolerance;Impaired balance (sitting and/or standing);Decreased safety awareness;Decreased knowledge of use of DME or AE      OT Treatment/Interventions: Self-care/ADL training;Therapeutic exercise;Energy conservation;DME and/or AE instruction;Therapeutic activities;Patient/family education    OT Goals(Current goals can be found in the care plan section) Acute Rehab OT Goals Patient Stated Goal: Get better OT Goal Formulation: With patient Time For Goal Achievement: 01/31/17 Potential to Achieve Goals: Good ADL Goals Pt Will Perform Grooming: standing;with min guard assist Pt Will Perform Upper Body Bathing: with set-up;with  supervision;sitting Pt Will Perform Lower Body Bathing: with min guard assist;sit to/from stand Pt Will Transfer to Toilet: with min guard assist;bedside commode;ambulating  OT Frequency: Min 2X/week   Barriers to D/C:            Co-evaluation              AM-PAC PT "6 Clicks" Daily Activity     Outcome Measure Help from another person eating meals?: None Help from another person taking care of personal grooming?: A Little Help from another person toileting, which includes using toliet, bedpan, or urinal?: A Little Help from another person bathing (including washing, rinsing, drying)?: A Little Help from another person to put on and taking off regular upper body clothing?: None Help from another person to put on and taking off regular lower body clothing?: A Little 6 Click Score: 20   End of Session Equipment Utilized During Treatment: Rolling walker;Gait belt Nurse Communication: Mobility status  Activity Tolerance: Patient tolerated treatment well;Patient limited by fatigue Patient left: in chair;with call bell/phone within reach;with chair alarm set  OT Visit Diagnosis: Unsteadiness on feet (R26.81);Other abnormalities of gait and mobility (R26.89);Muscle weakness (generalized) (M62.81)                Time: 4580-9983 OT Time Calculation (min): 39 min Charges:  OT General Charges $OT Visit: 1 Procedure OT Evaluation $OT Eval  Low Complexity: 1 Procedure OT Treatments $Self Care/Home Management : 23-37 mins G-Codes:     Almon Whitford MSOT, OTR/L Acute Rehab Pager: [phone] Office: [place] 01/17/2017, 9:03 AM

## 2017-01-18 DIAGNOSIS — N171 Acute kidney failure with acute cortical necrosis: Secondary | ICD-10-CM

## 2017-01-18 DIAGNOSIS — I10 Essential (primary) hypertension: Secondary | ICD-10-CM

## 2017-01-18 DIAGNOSIS — R1013 Epigastric pain: Secondary | ICD-10-CM

## 2017-01-18 LAB — BASIC METABOLIC PANEL
ANION GAP: 10 (ref 5–15)
BUN: 40 mg/dL — ABNORMAL HIGH (ref 6–20)
CALCIUM: 8.9 mg/dL (ref 8.9–10.3)
CO2: 19 mmol/L — AB (ref 22–32)
Chloride: 111 mmol/L (ref 101–111)
Creatinine, Ser: 1.72 mg/dL — ABNORMAL HIGH (ref 0.44–1.00)
GFR, EST AFRICAN AMERICAN: 30 mL/min — AB (ref >60)
GFR, EST NON AFRICAN AMERICAN: 26 mL/min — AB (ref >60)
Glucose, Bld: 97 mg/dL (ref 65–99)
Potassium: 5.1 mmol/L (ref 3.5–5.1)
Sodium: 140 mmol/L (ref 135–145)

## 2017-01-18 LAB — CBC
HCT: 27.3 % — ABNORMAL LOW (ref 36.0–46.0)
HEMOGLOBIN: 8.7 g/dL — AB (ref 12.0–15.0)
MCH: 30.3 pg (ref 26.0–34.0)
MCHC: 31.9 g/dL (ref 30.0–36.0)
MCV: 95.1 fL (ref 78.0–100.0)
Platelets: 123 10*3/uL — ABNORMAL LOW (ref 150–400)
RBC: 2.87 MIL/uL — AB (ref 3.87–5.11)
RDW: 16.2 % — ABNORMAL HIGH (ref 11.5–15.5)
WBC: 4.9 10*3/uL (ref 4.0–10.5)

## 2017-01-18 MED ORDER — OXYCODONE HCL 5 MG PO TABS
5.0000 mg | ORAL_TABLET | ORAL | Status: DC | PRN
  Administered 2017-01-19: 5 mg via ORAL
  Filled 2017-01-18: qty 1

## 2017-01-18 MED ORDER — SENNOSIDES-DOCUSATE SODIUM 8.6-50 MG PO TABS: 1.0000 | ORAL_TABLET | Freq: Two times a day (BID) | ORAL | 1 refills | Status: DC

## 2017-01-18 MED ORDER — METHOCARBAMOL 500 MG PO TABS: 500.0000 mg | ORAL_TABLET | Freq: Three times a day (TID) | ORAL | 1 refills | Status: DC

## 2017-01-18 MED ORDER — POLYETHYLENE GLYCOL 3350 17 G PO PACK: 17.0000 g | PACK | Freq: Every day | ORAL | 0 refills | Status: DC

## 2017-01-18 MED ORDER — OXYCODONE HCL ER 15 MG PO T12A: 15.0000 mg | EXTENDED_RELEASE_TABLET | Freq: Two times a day (BID) | ORAL | Status: DC

## 2017-01-18 MED ORDER — DEXAMETHASONE 4 MG PO TABS: 4.0000 mg | ORAL_TABLET | Freq: Every day | ORAL | 1 refills | Status: DC

## 2017-01-18 MED ORDER — OXYCODONE HCL ER 10 MG PO T12A: 20.0000 mg | EXTENDED_RELEASE_TABLET | Freq: Two times a day (BID) | ORAL | Status: DC

## 2017-01-18 MED ORDER — ENSURE ENLIVE PO LIQD
237.0000 mL | Freq: Two times a day (BID) | ORAL | 12 refills | Status: DC
Start: 2017-01-18 — End: 2017-03-30

## 2017-01-18 MED ORDER — OXYCODONE HCL 5 MG PO TABS: 5.0000 mg | ORAL_TABLET | Freq: Three times a day (TID) | ORAL | Status: DC

## 2017-01-18 MED ORDER — HYDRALAZINE HCL 50 MG PO TABS
50.0000 mg | ORAL_TABLET | Freq: Three times a day (TID) | ORAL | 1 refills | Status: DC
Start: 2017-01-18 — End: 2017-04-03

## 2017-01-18 MED ORDER — OXYCODONE HCL ER 15 MG PO T12A: 15.0000 mg | EXTENDED_RELEASE_TABLET | Freq: Two times a day (BID) | ORAL | 0 refills | Status: DC

## 2017-01-18 MED ORDER — OXYCODONE HCL ER 15 MG PO T12A
15.0000 mg | EXTENDED_RELEASE_TABLET | Freq: Two times a day (BID) | ORAL | Status: DC
  Administered 2017-01-18: 15 mg via ORAL
  Filled 2017-01-18: qty 1

## 2017-01-18 MED ORDER — OXYCODONE HCL 5 MG PO TABS
5.0000 mg | ORAL_TABLET | Freq: Three times a day (TID) | ORAL | Status: DC
  Administered 2017-01-18 – 2017-01-19 (×2): 5 mg via ORAL
  Filled 2017-01-18 (×2): qty 1

## 2017-01-18 MED ORDER — SODIUM BICARBONATE 650 MG PO TABS
650.0000 mg | ORAL_TABLET | Freq: Two times a day (BID) | ORAL | 2 refills | Status: DC
Start: 2017-01-18 — End: 2017-03-28

## 2017-01-18 MED ORDER — OXYCODONE HCL 5 MG PO TABS: 5.0000 mg | ORAL_TABLET | ORAL | Status: DC | PRN

## 2017-01-18 MED ORDER — OXYCODONE HCL 5 MG PO TABS: 5.0000 mg | ORAL_TABLET | ORAL | 0 refills | Status: DC | PRN

## 2017-01-18 MED ORDER — PANTOPRAZOLE SODIUM 40 MG PO TBEC: 40.0000 mg | DELAYED_RELEASE_TABLET | Freq: Every day | ORAL | 1 refills | Status: DC

## 2017-01-18 MED ORDER — OXYCODONE HCL ER 20 MG PO T12A: 20.0000 mg | EXTENDED_RELEASE_TABLET | Freq: Two times a day (BID) | ORAL | 0 refills | Status: DC

## 2017-01-18 NOTE — Discharge Summary (Addendum)
Physician Discharge Summary  Kayla Brooks MRN: [id number] DOB/AGE: 1929/10/25 83 y.o.  PCP: Axel Filler, MD   Admit date: 01/11/2017 Discharge date: 01/18/2017  Discharge Diagnoses:    Principal Problem:   Abdominal pain, lower Active Problems:   Multiple myeloma (HCC)   Essential hypertension   Normochromic normocytic anemia   ARF (acute renal failure) ([place])   Palliative care by specialist   Intractable headache   Addendum-patient unable to discharge yesterday,due to unclear discharge plan    Follow-up recommendations Follow-up with PCP in 3-5 days , including all  additional recommended appointments as below Follow-up CBC, CMP in 3-5 days      Current Discharge Medication List    START taking these medications   Details  dexamethasone (DECADRON) 4 MG tablet Take 1 tablet (4 mg total) by mouth daily. Qty: 30 tablet, Refills: 1    feeding supplement, ENSURE ENLIVE, (ENSURE ENLIVE) LIQD Take 237 mLs by mouth 2 (two) times daily between meals. Qty: 237 mL, Refills: 12    hydrALAZINE (APRESOLINE) 50 MG tablet Take 1 tablet (50 mg total) by mouth every 8 (eight) hours. Qty: 90 tablet, Refills: 1    methocarbamol (ROBAXIN) 500 MG tablet Take 1 tablet (500 mg total) by mouth 3 (three) times daily. Qty: 30 tablet, Refills: 1    oxyCODONE (OXY IR/ROXICODONE) 5 MG immediate release tablet Take 1 tablet (5 mg total) by mouth every 4 (four) hours as needed for severe pain. Qty: 30 tablet, Refills: 0    oxyCODONE (OXYCONTIN) 20 mg 12 hr tablet Take 1 tablet (20 mg total) by mouth every 12 (twelve) hours. Qty: 30 tablet, Refills: 0    pantoprazole (PROTONIX) 40 MG tablet Take 1 tablet (40 mg total) by mouth daily. Qty: 30 tablet, Refills: 1    polyethylene glycol (MIRALAX / GLYCOLAX) packet Take 17 g by mouth daily. Qty: 14 each, Refills: 0    senna-docusate (SENOKOT-S) 8.6-50 MG tablet Take 1 tablet by mouth 2 (two) times daily. Qty: 30 tablet, Refills: 1     sodium bicarbonate 650 MG tablet Take 1 tablet (650 mg total) by mouth 2 (two) times daily. Qty: 60 tablet, Refills: 2      CONTINUE these medications which have NOT CHANGED   Details  amLODipine (NORVASC) 5 MG tablet take 1 tablet by mouth once daily Qty: 90 tablet, Refills: 3   Associated Diagnoses: Essential hypertension    diclofenac sodium (VOLTAREN) 1 % GEL Apply 2 g topically 4 (four) times daily. Qty: 100 g, Refills: 0    nitroGLYCERIN (NITROSTAT) 0.4 MG SL tablet Place 0.4 mg under the tongue every 5 (five) minutes as needed for chest pain.    omeprazole (PRILOSEC) 20 MG capsule take 1 capsule by mouth once daily Qty: 30 capsule, Refills: 2    oxybutynin (DITROPAN) 5 MG tablet take 1 tablet by mouth twice a day Qty: 60 tablet, Refills: 1   Associated Diagnoses: Urge incontinence    prochlorperazine (COMPAZINE) 10 MG tablet Take 1 tablet (10 mg total) by mouth every 6 (six) hours as needed for nausea or vomiting. Qty: 30 tablet, Refills: 0    simvastatin (ZOCOR) 20 MG tablet take 1 tablet by mouth at bedtime Qty: 30 tablet, Refills: 5      STOP taking these medications     aspirin 81 MG tablet      HYDROcodone-acetaminophen (NORCO/VICODIN) 5-325 MG tablet      ibuprofen (ADVIL,MOTRIN) 600 MG tablet  lisinopril (PRINIVIL,ZESTRIL) 40 MG tablet            Discharge Condition:  Stable  Discharge Instructions Get Medicines reviewed and adjusted: Please take all your medications with you for your next visit with your Primary MD  Please request your Primary MD to go over all hospital tests and procedure/radiological results at the follow up, please ask your Primary MD to get all Hospital records sent to his/her office.  If you experience worsening of your admission symptoms, develop shortness of breath, life threatening emergency, suicidal or homicidal thoughts you must seek medical attention immediately by calling 911 or calling your MD immediately if  symptoms less severe.  You must read complete instructions/literature along with all the possible adverse reactions/side effects for all the Medicines you take and that have been prescribed to you. Take any new Medicines after you have completely understood and accpet all the possible adverse reactions/side effects.   Do not drive when taking Pain medications.   Do not take more than prescribed Pain, Sleep and Anxiety Medications  Special Instructions: If you have smoked or chewed Tobacco in the last 2 yrs please stop smoking, stop any regular Alcohol and or any Recreational drug use.  Wear Seat belts while driving.  Please note  You were cared for by a hospitalist during your hospital stay. Once you are discharged, your primary care physician will handle any further medical issues. Please note that NO REFILLS for any discharge medications will be authorized once you are discharged, as it is imperative that you return to your primary care physician (or establish a relationship with a primary care physician if you do not have one) for your aftercare needs so that they can reassess your need for medications and monitor your lab values.     Allergies  Allergen Reactions  . Morphine And Related Other (See Comments)    Abdominal Pain      Disposition: 01-Home or Self Care   Consults: Palliative care    Significant Diagnostic Studies:  Ct Abdomen Pelvis Wo Contrast  Result Date: 01/12/2017 CLINICAL DATA:  83 y/o F; history of ovarian cancer. Abdominal pain. Concern for hydronephrosis. EXAM: CT ABDOMEN AND PELVIS WITHOUT CONTRAST TECHNIQUE: Multidetector CT imaging of the abdomen and pelvis was performed following the standard protocol without IV contrast. COMPARISON:  01/25/2009 CT abdomen and pelvis FINDINGS: Lower chest: No acute abnormality. Decreased attenuation of cardiac blood pool compatible with anemia. Hepatobiliary: Cholelithiasis. No focal liver lesion. No intra or  extrahepatic biliary ductal dilatation. Pancreas: Unremarkable. No pancreatic ductal dilatation or surrounding inflammatory changes. Spleen: Normal in size without focal abnormality. Adrenals/Urinary Tract: Normal adrenal glands. No focal kidney lesion identified. No hydronephrosis. Mildly distended bladder. Stomach/Bowel: No definite obstructive or inflammatory changes of the bowel. Oral contrast extends into the distal small bowel. Moderate volume of stool in the colon. Vascular/Lymphatic: Aortic atherosclerosis with severe calcific atherosclerosis. No enlarged abdominal or pelvic lymph nodes. Reproductive: Status post hysterectomy. No adnexal masses. Other:  Negative. Musculoskeletal: Partially visualized right hip replacement. Diffuse mixed sclerotic and lucent lesions throughout the visualized bones up proximal femurs, pelvis, and spine. No acute fracture identified. IMPRESSION: 1. Diffuse mixed sclerotic and lucent lesions throughout the visualized bones up proximal femurs, pelvis, and spine probably representing metastatic disease or myeloma. No acute fracture identified. 2. Cholelithiasis. 3. No hydronephrosis. 4. Severe calcific atherosclerosis of the aorta. Electronically Signed   By: Kristine Garbe M.D.   On: 01/12/2017 03:14   Ct Head Wo Contrast  Result Date: 01/15/2017 CLINICAL DATA:  Headache EXAM: CT HEAD WITHOUT CONTRAST TECHNIQUE: Contiguous axial images were obtained from the base of the skull through the vertex without intravenous contrast. COMPARISON:  04/06/2013 FINDINGS: Brain: No acute intracranial abnormality. Specifically, no hemorrhage, hydrocephalus, mass lesion, acute infarction, or significant intracranial injury. Vascular: No hyperdense vessel or unexpected calcification. Skull: No acute calvarial abnormality. Sinuses/Orbits: Visualized paranasal sinuses are clear. Fluid in the right mastoid air cells. Left mastoid air cells clear. Orbital soft tissues unremarkable.  Other: None IMPRESSION: No intracranial abnormality. Right mastoid effusion. Electronically Signed   By: Rolm Baptise M.D.   On: 01/15/2017 10:51   Mr Brain Wo Contrast  Result Date: 01/17/2017 CLINICAL DATA:  Headache EXAM: MRI HEAD WITHOUT CONTRAST TECHNIQUE: Multiplanar, multiecho pulse sequences of the brain and surrounding structures were obtained without intravenous contrast. COMPARISON:  Head CT 01/15/2017 FINDINGS: Brain: The midline structures are normal. There is no focal diffusion restriction to indicate acute infarct. There is multifocal hyperintense T2-weighted signal within the periventricular white matter, most often seen in the setting of chronic microvascular ischemia. No intraparenchymal hematoma or chronic microhemorrhage. Brain volume is normal for age without age-advanced or lobar predominant atrophy. The dura is normal and there is no extra-axial collection. Vascular: Major intracranial arterial and venous sinus flow voids are preserved. Skull and upper cervical spine: The visualized skull base, calvarium, upper cervical spine and extracranial soft tissues are normal. Sinuses/Orbits: No fluid levels or advanced mucosal thickening. Small right mastoid effusion. Right lens replacement. IMPRESSION: Findings of chronic microvascular ischemia without acute abnormality. No specific finding to explain the reported headaches. Electronically Signed   By: Ulyses Jarred M.D.   On: 01/17/2017 19:19       Filed Weights   01/12/17 0524 01/13/17 2235 01/17/17 2104  Weight: 42.3 kg (93 lb 3.2 oz) 44.2 kg (97 lb 6.4 oz) 44.1 kg (97 lb 3.6 oz)     Microbiology: No results found for this or any previous visit (from the past 240 hour(s)).     Blood Culture    Component Value Date/Time   SDES URINE, RANDOM 08/18/2016 1930   SPECREQUEST NONE 08/18/2016 1930   CULT MULTIPLE SPECIES PRESENT, SUGGEST RECOLLECTION (A) 08/18/2016 1930   REPTSTATUS 08/20/2016 FINAL 08/18/2016 1930       Labs: Results for orders placed or performed during the hospital encounter of 01/11/17 (from the past 48 hour(s))  Basic metabolic panel     Status: Abnormal   Collection Time: 01/16/17 12:02 PM  Result Value Ref Range   Sodium 140 135 - 145 mmol/L   Potassium 3.4 (L) 3.5 - 5.1 mmol/L   Chloride 112 (H) 101 - 111 mmol/L   CO2 17 (L) 22 - 32 mmol/L   Glucose, Bld 107 (H) 65 - 99 mg/dL   BUN 30 (H) 6 - 20 mg/dL   Creatinine, Ser 1.30 (H) 0.44 - 1.00 mg/dL   Calcium 8.9 8.9 - 10.3 mg/dL   GFR calc non Af Amer 36 (L) >60 mL/min   GFR calc Af Amer 42 (L) >60 mL/min    Comment: (NOTE) The eGFR has been calculated using the CKD EPI equation. This calculation has not been validated in all clinical situations. eGFR's persistently <60 mL/min signify possible Chronic Kidney Disease.    Anion gap 11 5 - 15  CBC     Status: Abnormal   Collection Time: 01/16/17 12:02 PM  Result Value Ref Range   WBC 3.9 (L) 4.0 - 10.5 K/uL  RBC 2.95 (L) 3.87 - 5.11 MIL/uL   Hemoglobin 9.0 (L) 12.0 - 15.0 g/dL   HCT 27.3 (L) 36.0 - 46.0 %   MCV 92.5 78.0 - 100.0 fL   MCH 30.5 26.0 - 34.0 pg   MCHC 33.0 30.0 - 36.0 g/dL   RDW 16.3 (H) 11.5 - 15.5 %   Platelets 106 (L) 150 - 400 K/uL    Comment: REPEATED TO VERIFY PLATELET COUNT CONFIRMED BY SMEAR   C-reactive protein     Status: None   Collection Time: 01/16/17 12:02 PM  Result Value Ref Range   CRP <0.8 <1.0 mg/dL  Sedimentation rate     Status: Abnormal   Collection Time: 01/16/17  2:33 PM  Result Value Ref Range   Sed Rate >140 (H) 0 - 22 mm/hr     Lipid Panel     Component Value Date/Time   CHOL 127 02/25/2012 1636   TRIG 83 02/25/2012 1636   HDL 60 02/25/2012 1636   CHOLHDL 2.1 02/25/2012 1636   VLDL 17 02/25/2012 1636   LDLCALC 50 02/25/2012 1636     No results found for: HGBA1C      HPI   83 y.o.femalewith history of multiple myeloma presently not on treatment since patient was not able to tolerate last November  2017, chronic kidney disease, hypertension and anemia presents to the ER with complaints of worsening pain in the abdomen particularly on the pelvic bones. Patient's pain has been increasing progressively over the last few weeks and last 2-3 weeks and hardly has been able to ambulate. Patient also found to have  ARF and anemia. She developed a headache the day following admission (01/13/17) that is located on the left side of her head . Patient was evaluated by neurology for the headache. 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 -Pain management with decadron and scheduled Oxycodone and Robaxin  .   currently on Decadron 4 mg a day -Continue prn oxycodone -Palliative care consult- She remains unwilling to discuss her overall health and advanced care planning needs -PT OT therapy -home health -Follow up with oncologist outpatient.   # Intractable headache: Patient reported pain mostly on the left side. She has no focal neurological deficit. Continue Tylenol, oxycodone and fentanyl as needed. Patient reported a chronic issue with her vision is unchanged recently. ESR markedly elevated greater than 140, likely the setting of multiple myeloma. Discussed with the neurology , Recommended MRI of the brain. MRI negative for acute events.    #Symptomatic anemia: Status post 2 units of red blood cell transfusion. Monitor CBC. Hemoglobin is stable. No sign of bleeding.    #Acute kidney injury on CKD stage 3, likely prerenal presenting creatinine 2.75, now 1.3 -Serum creatinine level stable and around baseline. Monitor BMP. Avoid nephrotoxins/ACE inhibitor. -continue sod bicarbonate,      #Hypertension: Blood pressure acceptable. Continue hydralazine, amlodipine.  #Severe protein  calorie malnutrition: Dietary referral and encourage oral intake  #Gerd: On protonix.  #Physical deconditioning: PT OT evaluation-will need home health    Discharge Exam  Blood pressure (!) 162/63, pulse 84, temperature 98.5 F (36.9 C), temperature source Oral, resp. rate 19, height '5\' 5"'  (1.651 m), weight 44.1 kg (97 lb 3.6 oz), SpO2 98 %.  Cardiovascular system: Regular rate rhythm, S1 is  normal. No pedal edema Gastrointestinal system: Abdomen soft, nontender, nondistended. [place] positive Central nervous system: Alert awake and following commands. Muscle strength 5 over 5 in all extremities. Skin: No rashes, lesions or ulcers Psychiatry: Judgement and insight appear normal. Mood & affect appropriate.     Follow-up Information    Axel Filler, MD. Call.   Specialty:  Internal Medicine Why:  Hospital follow-up in 3-5 days Contact information: [address] [place] [address] [phone]           Signed: Reyne Dumas 01/18/2017, 9:44 AM        Time spent >1 hour

## 2017-01-18 NOTE — Progress Notes (Signed)
Subjective: Sleeping well and no HA  Exam: Vitals:   01/17/17 2104 01/18/17 0436  BP: (!) 152/53 (!) 162/63  Pulse: 73 84  Resp: 18 19  Temp: 98.7 F (37.1 C) 98.5 F (36.9 C)    HEENT-  Normocephalic, no lesions, without obvious abnormality.  Normal external eye and conjunctiva.  Normal TM's bilaterally.  Normal auditory canals and external ears. Normal external nose, mucus membranes and septum.  Normal pharynx.    Neuro:  CN: Pupils are equal and round. They are symmetrically reactive from 3-->2 mm. EOMI without nystagmus. Facial sensation is intact to light touch. Face is symmetric at rest with normal strength and mobility. Hearing is intact to conversational voice. Palate elevates symmetrically and uvula is midline. Voice is normal in tone, pitch and quality. Bilateral SCM and trapezii are 5/5. Tongue is midline with normal bulk and mobility.  Motor: Normal bulk, tone, and strength. 5/5 throughout. No drift.  Sensation: Intact to light touch.  DTRs: 2+, symmetric  Toes downgoing bilaterally. No pathologic reflexes.  Coordination: Finger-to-nose and heel-to-shin are without dysmetria     Pertinent Labs/Diagnostics: none  Etta Quill PA-C [facility] [phone]  Impression: HA which has now resolved.    Recommendations: 1)None--neuro S/O    01/18/2017, 9:58 AM

## 2017-01-18 NOTE — Care Management Note (Addendum)
Case Management Note  Patient Details  Name: Kayla Brooks MRN: [id number] Date of Birth: 11-19-1929  Subjective/Objective:   Pt admitted with abd pain, headaches and pelvic pain - found to have mestatic CA                 Action/Plan:   PTA independent from home alone.  Pt closely being followed by Palliative with multiple attempts at [facility] - currently pt is not interested in Hospice of any kind and refuses for staff to contact POA regarding [facility].  CM spoke with pt and informed pt of the community resources that could also continue palliative discussions post discharge, pt is in agreement with [facility] for this purpose but not home palliative consult.  Pt is also in agreement with [facility] as recommended - pt given choice with agencies that accept insurance for [facility] recommended and pt chose [facility] - agency contacted and referral accepted.  Pt states she stays alone but if she needs help she can call family, she also states that PTA she walked independently however she has a cane in the home.    Expected Discharge Date:                  Expected Discharge Plan:  [facility] [place]  In-House Referral:     Discharge planning Services  CM Consult  Post Acute Care Choice:    Choice offered to:  Patient  DME Arranged:    DME Agency:     HH Arranged:  RN, Disease Management, PT, OT, Nurse's Aide, Social Work, Refused [facility] [place] Agency:   Nanine Means)  Status of Service:  In process, will continue to follow  If discussed at Long Length of Stay Meetings, dates discussed:    Additional Comments: 01/18/2017 Discussed in LOS 01/18/17 - pt remains appropriate for continued stay.  Barrier to discharge to pain control.  CM will continue to follow for discharge needs   Maryclare Labrador, RN 01/18/2017, 3:22 PM

## 2017-01-18 NOTE — Progress Notes (Signed)
Daily Progress Note   Patient Name: Kayla Brooks       Date: 01/18/2017 DOB: 1930/04/24  Age: 83 y.o. MRN#: [id number] Attending Physician: Reyne Dumas, MD Primary Care Physician: Axel Filler, MD Admit Date: 01/11/2017  Reason for Consultation/Follow-up: Establishing goals of care, Pain control and Psychosocial/spiritual support  Subjective: Kayla Brooks was extremely lethargic when I saw her at 1400. She would wake to voice, but could not have a sustained conversation due to falling asleep mid-sentence. She reports increased pain this morning, but has felt much better since lunch. She also feels her overall pain status has improved, both her abdominal pain and hear headaches.    Length of Stay: 6  Current Medications: Scheduled Meds:  . amLODipine  5 mg Oral Daily  . dexamethasone  4 mg Oral Daily  . feeding supplement (ENSURE ENLIVE)  237 mL Oral BID BM  . hydrALAZINE  50 mg Oral Q8H  . methocarbamol  500 mg Oral TID  . oxybutynin  5 mg Oral BID  . oxyCODONE  15 mg Oral Q12H  . pantoprazole  40 mg Oral Daily  . polyethylene glycol  17 g Oral Daily  . senna-docusate  1 tablet Oral BID  . sodium bicarbonate  1,300 mg Oral BID    Continuous Infusions:   PRN Meds: acetaminophen, hydrALAZINE, oxyCODONE  Physical Exam  Constitutional: She is oriented to person, place, and time. She appears lethargic. No distress.  Frail elderly female seen sitting in her recliner. In no acute distress  HENT:  Head: Normocephalic and atraumatic.  Mouth/Throat: Oropharynx is clear and moist and mucous membranes are normal. Abnormal dentition. No oropharyngeal exudate.  Eyes: EOM are normal.  Neck: Normal range of motion. Neck supple.  Cardiovascular: Normal rate and regular rhythm.     Pulmonary/Chest: Effort normal and breath sounds normal.  Abdominal: Soft. Bowel sounds are normal.  Musculoskeletal: She exhibits no edema.  Generalized weakness, but able to reposition in chair  Neurological: She is oriented to person, place, and time. She appears lethargic.  Very lethargic. Conversation limited by fatigue.   Skin: Skin is warm and dry.  Psychiatric: She is slowed.  Difficult to assess as pt minimally able to engage in a conversation due to lethargy  Nursing note and vitals reviewed.  Vital Signs: BP (!) 162/59   Pulse 65   Temp 98.5 F (36.9 C) (Oral)   Resp 19   Ht '5\' 5"'  (1.651 m)   Wt 44.1 kg (97 lb 3.6 oz)   SpO2 98%   BMI 16.18 kg/m  SpO2: SpO2: 98 % O2 Device: O2 Device: Not Delivered O2 Flow Rate:    Intake/output summary:   Intake/Output Summary (Last 24 hours) at 01/18/17 1408 Last data filed at 01/18/17 0522  Gross per 24 hour  Intake              120 ml  Output                0 ml  Net              120 ml   LBM: Last BM Date: 01/16/17 Baseline Weight: Weight: 42.3 kg (93 lb 3.2 oz) Most recent weight: Weight: 44.1 kg (97 lb 3.6 oz)  Palliative Assessment/Data: PPS 50-60%   Flowsheet Rows     Most Recent Value  Intake Tab  Unit at Time of Referral  Med/Surg Unit  Palliative Care Primary Diagnosis  Cancer  Date Notified  01/13/17  Palliative Care Type  New Palliative care  Reason for referral  Pain, Clarify Goals of Care  Date of Admission  01/11/17  # of days IP prior to Palliative referral  2  Clinical Assessment  Palliative Performance Scale Score  40%  Psychosocial & Spiritual Assessment  Palliative Care Outcomes      Patient Active Problem List   Diagnosis Date Noted  . Intractable headache   . Palliative care by specialist   . Abdominal pain, lower 01/12/2017  . Normochromic normocytic anemia 01/12/2017  . ARF (acute renal failure) ([place]) 01/12/2017  . Acute kidney injury (nontraumatic) ([place])   . Healthcare  maintenance 01/02/2015  . Urge incontinence 12/11/2014  . Multiple myeloma ([place]) 03/27/2009  . Essential hypertension 05/04/2006  . GERD (gastroesophageal reflux disease) 05/04/2006    Palliative Care Assessment & Plan   Patient Profile: Kayla Brooks a 83 y.o.femalewith history of multiple myeloma presently not on treatment since patient was not able to tolerate last November 2017. She also has chronic kidney disease, hypertension and anemia. She presents to the ER with complaints of worsening pain in the abdomen particularly on the pelvic bones. Patient's pain has been increasing progressively over the last few weeks and last 2-3 weeks and hardly has been able to ambulate. Denies any nausea vomiting or diarrhea. Denies any chest pain or shortness of breath.Imaging revealed likely progression of myeloma. She has been followed by Dr. Alen Blew and declined further treatment. Palliative consulted to assist in clarifying goals of care   Assessment I revisited today to check on [name] and follow-up on her pain control. Since my visit yesterday a few changes were made to her pain regimen, including starting a Fentanyl patch (which was then d/c'd this morning) and transitioning to Oxycontin (she received Oxycontin 10m and one dose of Oxycodone 563m approximately three hours prior to my visit). Unfortunately, her pain became uncontrolled with an acute pain crisis, and then she became very sedated post pain medication administration.   In talking with MaRosaria Ferriesn prior visits, she was adamant about not utilizing Morphine, which she related is due to the stigma surrounding the medication and her fear of it. She was accepting of trying Oxycodone, but was very hesitant about pain medication in general. She also refused scheduled Tylenol  feeling like she was on "too many pills." Yesterday, she felt her pain was under excellent control. She was awake, eating, moving, and "feeling like a queen." She did not  want to adjust her medication based on how well she was doing with it. Given this response, I have changed her back to this regimen with her agreement.   Finally, I understand the concern surrounding her discharge plan. Up until today she was alert, oriented, and I felt clearly decisional in dictating her care. She verbalized to me a clear plan to go home and utilize friends and family for support, but did not give me permission to call her friends or family to ascertain their capacity to provide the level of care PT/OT recommends. She also refused to discuss how she planned to manage her health at home, given the known progression of her cancer and the likelihood of increased symptom burden over time.   Recommendations/Plan:  Pain: Luella has a variable response to pain medication and I suspect will not do well with long acting and PRN dosing. I have changed her back to her pain regimen from yesterday, which had been effective (Oxycodone 14m q8H and Oxycodone 528mq4H PRN for breakthrough pain). Given her reticence to take pain medication and the risk of confusing the two similarly named medications (Oxycontin versus Oxycodone), I think keeping a simplified regimen with one medication type would be the best plan for her. I would also continue the Decadron.  GOC: She remains unwilling to discuss her overall health and advanced care planning needs, and is in fact not able to today due to sedation.  Our team has attempted to engage her in this discussion four times. I suspect she will need a f/u visit with Oncology to facilitate these discussions.   Discharge: I agree that MaRosaria Ferrieseturning home with unknown support makes me uncomfortable. That said, if she is deemed decisional (and no one has questioned her capacity per chart review), she has the right to refuse SNF, Hospice, and [place]ore[place]aking with family/friends. Now, before discharge can occur we do need to find an appropriate pain regimen that adequately  treats her pain without causing sedation. From that standpoint she is clearly not ready for discharge today.   Goals of Care and Additional Recommendations:  Limitations on Scope of Treatment: Full Scope Treatment  Code Status:    Code Status Orders        Start     Ordered   01/12/17 0300  Full code  Continuous     01/12/17 0300    Code Status History    Date Active Date Inactive Code Status Order ID Comments User Context   06/10/2015  6:03 PM 06/11/2015  7:37 PM Full Code [id number] LollMD ED       Prognosis:   < 6 months in the setting of progressive multiple myeloma for which no treatment is being sought.   Discharge Planning:  Home with [facility] discussed with pt.   Thank you for allowing the Palliative Medicine Team to assist in the care of this patient.  Total time: 35 minutes    Greater than 50%  of this time was spent counseling and coordinating care related to the above assessment and plan.  SaJannette FogoNP  Please contact Palliative Medicine Team phone at [phone] questions and concerns.

## 2017-01-19 DIAGNOSIS — C7951 Secondary malignant neoplasm of bone: Secondary | ICD-10-CM

## 2017-01-19 DIAGNOSIS — Z7189 Other specified counseling: Secondary | ICD-10-CM

## 2017-01-19 DIAGNOSIS — G893 Neoplasm related pain (acute) (chronic): Secondary | ICD-10-CM

## 2017-01-19 LAB — GLUCOSE, CAPILLARY: GLUCOSE-CAPILLARY: 98 mg/dL (ref 65–99)

## 2017-01-19 MED ORDER — OXYCODONE HCL 5 MG PO TABS: ORAL_TABLET | ORAL | 0 refills | Status: DC

## 2017-01-19 MED ORDER — OXYCODONE HCL 5 MG PO TABS: 5.0000 mg | ORAL_TABLET | ORAL | 0 refills | Status: DC | PRN

## 2017-01-19 NOTE — Progress Notes (Deleted)
Physical Therapy Treatment Patient Details Name: Kayla Brooks MRN: [id number] DOB: 1929/07/03 Today's Date: 01/19/2017    History of Present Illness 83 y.o. female presents to the ER with complaints of worsening pain in the abdomen particularly on the pelvic bones. PMH including chronic kidney disease, hypertension, anemia, and history of multiple myeloma presently not on treatment since patient was not able to tolerate last November 2017.    PT Comments    Pt presented in recliner with eyes closed and lethargic. Pt required multiple verbal and tactile cues throughout session. Pt required mod A for power up to standing and Min A for ambulation. Discussed safety of SNF vs home and encouraged pt to consider further care. Pt will benefit from continued skilled physical therapy to improve strength and endurance to increase safety.    Follow Up Recommendations  Supervision/Assistance - 24 hour;Home health PT;Other (comment)     Equipment Recommendations       Recommendations for Other Services       Precautions / Restrictions Precautions Precautions: Fall Restrictions Weight Bearing Restrictions: No    Mobility  Bed Mobility Overal bed mobility: Needs Assistance Bed Mobility: Supine to Sit     Supine to sit: Mod assist     General bed mobility comments: Assist for LEs to EOB and for trunk elevation to sitting.  Transfers Overall transfer level: Needs assistance Equipment used: Rolling walker (2 wheeled) Transfers: Sit to/from Stand Sit to Stand: Mod assist Stand pivot transfers: Min assist       General transfer comment: Cues needed for hand placement during sit to stand. Mod A to power up into standing.   Ambulation/Gait Ambulation/Gait assistance: Min assist;+2 safety/equipment Ambulation Distance (Feet): 100 Feet Assistive device: Rolling walker (2 wheeled) Gait Pattern/deviations: Step-through pattern;Decreased step length - right;Decreased step length -  left;Narrow base of support   Gait velocity interpretation: Below normal speed for age/gender General Gait Details: Feet stepping on each other during step through   Stairs            Wheelchair Mobility    Modified Rankin (Stroke Patients Only)       Balance Overall balance assessment: Needs assistance Sitting-balance support: Bilateral upper extremity supported;Feet supported Sitting balance-Leahy Scale: Fair Sitting balance - Comments: min guard throughout due to balance   Standing balance support: Single extremity supported Standing balance-Leahy Scale: Poor Standing balance comment: RW for support (Pt required one hand on RW for support and balance)                            Cognition Arousal/Alertness: Lethargic;Suspect due to medications Behavior During Therapy: Flat affect Overall Cognitive Status: Within Functional Limits for tasks assessed                                 General Comments: Eyes closed 60% of session      Exercises      General Comments        Pertinent Vitals/Pain Pain Assessment: No/denies pain    Home Living Family/patient expects to be discharged to:: Private residence Living Arrangements: Alone Available Help at Discharge: Family;Friend(s) Type of Home: Apartment Home Access: Level entry   Home Layout: One level Home Equipment: Walker - 2 wheels;Wheelchair - manual;Bedside commode;Hand held shower head;Cane - single point      Prior Function Level of Independence: Independent  PT Goals (current goals can now be found in the care plan section) Acute Rehab PT Goals Patient Stated Goal: none stated    Frequency    Min 3X/week      PT Plan      Co-evaluation              AM-PAC PT "6 Clicks" Daily Activity  Outcome Measure  Difficulty turning over in bed (including adjusting bedclothes, sheets and blankets)?: A Little Difficulty moving from lying on back to sitting on  the side of the bed? : A Lot Difficulty sitting down on and standing up from a chair with arms (e.g., wheelchair, bedside commode, etc,.)?: Total Help needed moving to and from a bed to chair (including a wheelchair)?: A Little Help needed walking in hospital room?: A Little Help needed climbing 3-5 steps with a railing? : A Lot 6 Click Score: 14    End of Session Equipment Utilized During Treatment: Gait belt Activity Tolerance: Patient tolerated treatment well Patient left: in chair;with call bell/phone within reach;with chair alarm set   PT Visit Diagnosis: Muscle weakness (generalized) (M62.81);Unsteadiness on feet (R26.81)     Time: 1610-9604 PT Time Calculation (min) (ACUTE ONLY): 24 min  Charges:                       G Codes:       Sandria Bales, SPT Acute Rehabilitation Services Office: [phone]   Sandria Bales 01/19/2017, 1:40 PM

## 2017-01-19 NOTE — Progress Notes (Signed)
Physical Therapy Treatment Patient Details Name: Kayla Brooks MRN: [id number] DOB: [birth year] Today's Date: 01/19/2017    History of Present Illness 83 y.o. female presents to the ER with complaints of worsening pain in the abdomen particularly on the pelvic bones. PMH including chronic kidney disease, hypertension, anemia, and history of multiple myeloma presently not on treatment since patient was not able to tolerate last November 2017.    PT Comments    Pt presented in recliner with eyes closed and lethargic (likely due to pain medication). Pt required multiple verbal and tactile cues throughout session. Pt required mod A to initiate power up to standing and Min A for ambulation. Pt denied pain throughout session. Discussed safety of SNF vs home and encouraged pt to consider further care. Pt will benefit from continued skilled physical therapy to improve strength and endurance to increase safety.     Follow Up Recommendations  Supervision/Assistance - 24 hour;Home health PT;Other (comment)     Equipment Recommendations  Hospital bed (worth consideration; will defer to [facility] services)    Recommendations for Other Services       Precautions / Restrictions Precautions Precautions: Fall Restrictions Weight Bearing Restrictions: No    Mobility  Bed Mobility     Transfers Overall transfer level: Needs assistance Equipment used: Rolling walker (2 wheeled) Transfers: Sit to/from Stand Sit to Stand: Mod assist (Tactile cues to initiate sit to stand)       General transfer comment: Cues needed for hand placement during sit to stand. Mod A to power up into standing.   Ambulation/Gait Ambulation/Gait assistance: Min assist;+2 safety/equipment Ambulation Distance (Feet): 100 Feet Assistive device: Rolling walker (2 wheeled) Gait Pattern/deviations: Step-through pattern;Decreased step length - right;Decreased step length - left;Narrow base of support   Gait velocity  interpretation: Below normal speed for age/gender General Gait Details: Feet in contact during step through due to very narrow base of support   Stairs            Wheelchair Mobility    Modified Rankin (Stroke Patients Only)       Balance Overall balance assessment: Needs assistance Sitting-balance support: Bilateral upper extremity supported;Feet supported Sitting balance-Leahy Scale: Fair Sitting balance - Comments: min guard throughout due to balance   Standing balance support: Single extremity supported Standing balance-Leahy Scale: Poor Standing balance comment: RW for support (Pt required one hand on RW for support and balance)                            Cognition Arousal/Alertness: Lethargic;Suspect due to medications Behavior During Therapy: Flat affect Overall Cognitive Status: Within Functional Limits for tasks assessed                                 General Comments: Eyes closed 60% of session      Exercises      General Comments        Pertinent Vitals/Pain Pain Assessment: No/denies pain    Home Living Family/patient expects to be discharged to:: Private residence Living Arrangements: Alone Available Help at Discharge: Family;Friend(s) Type of Home: Apartment Home Access: Level entry   Home Layout: One level Home Equipment: Walker - 2 wheels;Wheelchair - manual;Bedside commode;Hand held shower head;Cane - single point      Prior Function Level of Independence: Independent          PT Goals (current goals  can now be found in the care plan section) Acute Rehab PT Goals Patient Stated Goal: none stated    Frequency    Min 3X/week      PT Plan Current plan remains appropriate (would rather pt seek post-acute rehab or hospice)    Co-evaluation              AM-PAC PT "6 Clicks" Daily Activity  Outcome Measure  Difficulty turning over in bed (including adjusting bedclothes, sheets and blankets)?: A  Little Difficulty moving from lying on back to sitting on the side of the bed? : A Lot Difficulty sitting down on and standing up from a chair with arms (e.g., wheelchair, bedside commode, etc,.)?: Total Help needed moving to and from a bed to chair (including a wheelchair)?: A Little Help needed walking in hospital room?: A Little Help needed climbing 3-5 steps with a railing? : A Lot 6 Click Score: 14    End of Session Equipment Utilized During Treatment: Gait belt Activity Tolerance: Patient tolerated treatment well Patient left: in chair;with call bell/phone within reach;with chair alarm set   PT Visit Diagnosis: Muscle weakness (generalized) (M62.81);Pain;Unsteadiness on feet (R26.81)     Time: 6789-3810 PT Time Calculation (min) (ACUTE ONLY): 24 min  Charges:  $Gait Training: 23-37 mins                    G Codes:       Kayla Brooks, SPT Acute Rehabilitation Services Office: [phone]    Kayla Brooks 01/19/2017, 2:32 PM

## 2017-01-19 NOTE — Progress Notes (Signed)
Occupational Therapy Treatment Patient Details Name: Kayla Brooks MRN: [id number] DOB: 03/02/1930 Today's Date: 01/19/2017    History of present illness 83 y.o. female presents to the ER with complaints of worsening pain in the abdomen particularly on the pelvic bones. PMH including chronic kidney disease, hypertension, anemia, and history of multiple myeloma presently not on treatment since patient was not able to tolerate last November 2017.   OT comments  Pt with increased lethargy this session (?due to meds; RN notified) requiring max verbal and tactile cues for initiation, sequencing, arousal, and attention to task. Pt able to perform stand pivot transfer with min assist and required mod assist for grooming tasks in sitting. D/c plan remains appropriate. Will continue to follow acutely.   Follow Up Recommendations  Home health OT;Supervision/Assistance - 24 hour    Equipment Recommendations  Tub/shower bench    Recommendations for Other Services      Precautions / Restrictions Precautions Precautions: Fall Restrictions Weight Bearing Restrictions: No       Mobility Bed Mobility Overal bed mobility: Needs Assistance Bed Mobility: Supine to Sit     Supine to sit: Mod assist     General bed mobility comments: Assist for LEs to EOB and for trunk elevation to sitting.  Transfers Overall transfer level: Needs assistance Equipment used: Rolling walker (2 wheeled) Transfers: Sit to/from Stand;Stand Pivot Transfers Sit to Stand: Mod assist Stand pivot transfers: Min assist       General transfer comment: Mod assist to boost up from EOB, min assist for pivot to chair. Cues throughout for initiation and sequencing.    Balance Overall balance assessment: Needs assistance Sitting-balance support: Feet supported;Bilateral upper extremity supported Sitting balance-Leahy Scale: Fair Sitting balance - Comments: min guard throughout due to balance   Standing balance  support: Bilateral upper extremity supported Standing balance-Leahy Scale: Poor Standing balance comment: RW for support                           ADL either performed or assessed with clinical judgement   ADL Overall ADL's : Needs assistance/impaired     Grooming: Moderate assistance;Sitting;Wash/dry face;Brushing hair Grooming Details (indicate cue type and reason): max cues for attention and maintaining arousal                 Toilet Transfer: Minimal assistance;Stand-pivot;RW Toilet Transfer Details (indicate cue type and reason): Simulated EOB > chair         Functional mobility during ADLs: Minimal assistance;Rolling walker General ADL Comments: Pt with increased lethargy this session; arousable but keeps eyes closed throughout session. Max cues for initiation and sequencing of functional activities.     Vision       Perception     Praxis      Cognition Arousal/Alertness: Lethargic;Suspect due to medications Behavior During Therapy: Flat affect Overall Cognitive Status: Within Functional Limits for tasks assessed                                          Exercises     Shoulder Instructions       General Comments      Pertinent Vitals/ Pain       Pain Assessment: No/denies pain  Home Living                                            Prior Functioning/Environment              Frequency  Min 2X/week        Progress Toward Goals  OT Goals(current goals can now be found in the care plan section)  Progress towards OT goals: Not progressing toward goals - comment (increased lethargy)  Acute Rehab OT Goals Patient Stated Goal: none stated OT Goal Formulation: With patient  Plan Discharge plan remains appropriate    Co-evaluation                 AM-PAC PT "6 Clicks" Daily Activity     Outcome Measure   Help from another person eating meals?: None Help from another person taking care  of personal grooming?: A Lot Help from another person toileting, which includes using toliet, bedpan, or urinal?: A Little Help from another person bathing (including washing, rinsing, drying)?: A Lot Help from another person to put on and taking off regular upper body clothing?: A Little Help from another person to put on and taking off regular lower body clothing?: A Lot 6 Click Score: 16    End of Session Equipment Utilized During Treatment: Gait belt;Rolling walker  OT Visit Diagnosis: Unsteadiness on feet (R26.81);Other abnormalities of gait and mobility (R26.89);Muscle weakness (generalized) (M62.81)   Activity Tolerance Patient limited by lethargy   Patient Left in chair;with call bell/phone within reach;with chair alarm set   Nurse Communication Mobility status;Other (comment) (pt with increased lethargy)        Time: 4193-7902 OT Time Calculation (min): 16 min  Charges: OT General Charges $OT Visit: 1 Procedure OT Treatments $Self Care/Home Management : 8-22 mins  Evanell Redlich A. Ulice Brilliant, M.S., OTR/L Pager: [place] 01/19/2017, 10:37 AM

## 2017-01-19 NOTE — Discharge Summary (Addendum)
Physician Discharge Summary  Kayla Brooks MRN: [id number] DOB/AGE: 81-Sep-1931 81 y.o.  PCP: Axel Filler, MD   Admit date: 01/11/2017 Discharge date: 01/19/2017  Discharge Diagnoses:    Principal Problem:   Abdominal pain, lower Active Problems:   Multiple myeloma (HCC)   Essential hypertension   Normochromic normocytic anemia   ARF (acute renal failure) ([place])   Palliative care by specialist   Intractable headache   Pain from bone metastases (HCC)   Goals of care, counseling/discussion       Follow-up recommendations Follow-up with PCP in 3-5 days , including all  additional recommended appointments as below Follow-up CBC, CMP in 3-5 days Patient is being discharged with [facility] and outpatient palliative care  Discussed plan of care with son, he is willing to provide 24/7 care     Current Discharge Medication List    START taking these medications   Details  dexamethasone (DECADRON) 4 MG tablet Take 1 tablet (4 mg total) by mouth daily. Qty: 30 tablet, Refills: 1    feeding supplement, ENSURE ENLIVE, (ENSURE ENLIVE) LIQD Take 237 mLs by mouth 2 (two) times daily between meals. Qty: 237 mL, Refills: 12    hydrALAZINE (APRESOLINE) 50 MG tablet Take 1 tablet (50 mg total) by mouth every 8 (eight) hours. Qty: 90 tablet, Refills: 1    methocarbamol (ROBAXIN) 500 MG tablet Take 1 tablet (500 mg total) by mouth 3 (three) times daily. Qty: 30 tablet, Refills: 1    oxyCODONE (OXY IR/ROXICODONE) 5 MG immediate release tablet 5 mg TID, then 5 mg po q 4 prn Qty: 30 tablet, Refills: 0    pantoprazole (PROTONIX) 40 MG tablet Take 1 tablet (40 mg total) by mouth daily. Qty: 30 tablet, Refills: 1    polyethylene glycol (MIRALAX / GLYCOLAX) packet Take 17 g by mouth daily. Qty: 14 each, Refills: 0    senna-docusate (SENOKOT-S) 8.6-50 MG tablet Take 1 tablet by mouth 2 (two) times daily. Qty: 30 tablet, Refills: 1    sodium bicarbonate 650 MG tablet Take 1 tablet (650  mg total) by mouth 2 (two) times daily. Qty: 60 tablet, Refills: 2      CONTINUE these medications which have NOT CHANGED   Details  amLODipine (NORVASC) 5 MG tablet take 1 tablet by mouth once daily Qty: 90 tablet, Refills: 3   Associated Diagnoses: Essential hypertension    diclofenac sodium (VOLTAREN) 1 % GEL Apply 2 g topically 4 (four) times daily. Qty: 100 g, Refills: 0    nitroGLYCERIN (NITROSTAT) 0.4 MG SL tablet Place 0.4 mg under the tongue every 5 (five) minutes as needed for chest pain.    omeprazole (PRILOSEC) 20 MG capsule take 1 capsule by mouth once daily Qty: 30 capsule, Refills: 2    oxybutynin (DITROPAN) 5 MG tablet take 1 tablet by mouth twice a day Qty: 60 tablet, Refills: 1   Associated Diagnoses: Urge incontinence    prochlorperazine (COMPAZINE) 10 MG tablet Take 1 tablet (10 mg total) by mouth every 6 (six) hours as needed for nausea or vomiting. Qty: 30 tablet, Refills: 0    simvastatin (ZOCOR) 20 MG tablet take 1 tablet by mouth at bedtime Qty: 30 tablet, Refills: 5      STOP taking these medications     aspirin 81 MG tablet      HYDROcodone-acetaminophen (NORCO/VICODIN) 5-325 MG tablet      ibuprofen (ADVIL,MOTRIN) 600 MG tablet      lisinopril (PRINIVIL,ZESTRIL) 40 MG tablet  Discharge Condition:  Stable  Discharge Instructions Get Medicines reviewed and adjusted: Please take all your medications with you for your next visit with your Primary MD  Please request your Primary MD to go over all hospital tests and procedure/radiological results at the follow up, please ask your Primary MD to get all Hospital records sent to his/her office.  If you experience worsening of your admission symptoms, develop shortness of breath, life threatening emergency, suicidal or homicidal thoughts you must seek medical attention immediately by calling 911 or calling your MD immediately if symptoms less severe.  You must read complete  instructions/literature along with all the possible adverse reactions/side effects for all the Medicines you take and that have been prescribed to you. Take any new Medicines after you have completely understood and accpet all the possible adverse reactions/side effects.   Do not drive when taking Pain medications.   Do not take more than prescribed Pain, Sleep and Anxiety Medications  Special Instructions: If you have smoked or chewed Tobacco in the last 2 yrs please stop smoking, stop any regular Alcohol and or any Recreational drug use.  Wear Seat belts while driving.  Please note  You were cared for by a hospitalist during your hospital stay. Once you are discharged, your primary care physician will handle any further medical issues. Please note that NO REFILLS for any discharge medications will be authorized once you are discharged, as it is imperative that you return to your primary care physician (or establish a relationship with a primary care physician if you do not have one) for your aftercare needs so that they can reassess your need for medications and monitor your lab values.     Allergies  Allergen Reactions  . Morphine And Related Other (See Comments)    Abdominal Pain      Disposition: 01-Home or Self Care   Consults: Palliative care    Significant Diagnostic Studies:  Ct Abdomen Pelvis Wo Contrast  Result Date: 01/12/2017 CLINICAL DATA:  83 y/o F; history of ovarian cancer. Abdominal pain. Concern for hydronephrosis. EXAM: CT ABDOMEN AND PELVIS WITHOUT CONTRAST TECHNIQUE: Multidetector CT imaging of the abdomen and pelvis was performed following the standard protocol without IV contrast. COMPARISON:  01/25/2009 CT abdomen and pelvis FINDINGS: Lower chest: No acute abnormality. Decreased attenuation of cardiac blood pool compatible with anemia. Hepatobiliary: Cholelithiasis. No focal liver lesion. No intra or extrahepatic biliary ductal dilatation. Pancreas:  Unremarkable. No pancreatic ductal dilatation or surrounding inflammatory changes. Spleen: Normal in size without focal abnormality. Adrenals/Urinary Tract: Normal adrenal glands. No focal kidney lesion identified. No hydronephrosis. Mildly distended bladder. Stomach/Bowel: No definite obstructive or inflammatory changes of the bowel. Oral contrast extends into the distal small bowel. Moderate volume of stool in the colon. Vascular/Lymphatic: Aortic atherosclerosis with severe calcific atherosclerosis. No enlarged abdominal or pelvic lymph nodes. Reproductive: Status post hysterectomy. No adnexal masses. Other:  Negative. Musculoskeletal: Partially visualized right hip replacement. Diffuse mixed sclerotic and lucent lesions throughout the visualized bones up proximal femurs, pelvis, and spine. No acute fracture identified. IMPRESSION: 1. Diffuse mixed sclerotic and lucent lesions throughout the visualized bones up proximal femurs, pelvis, and spine probably representing metastatic disease or myeloma. No acute fracture identified. 2. Cholelithiasis. 3. No hydronephrosis. 4. Severe calcific atherosclerosis of the aorta. Electronically Signed   By: Kristine Garbe M.D.   On: 01/12/2017 03:14   Ct Head Wo Contrast  Result Date: 01/15/2017 CLINICAL DATA:  Headache EXAM: CT HEAD WITHOUT CONTRAST TECHNIQUE: Contiguous axial  images were obtained from the base of the skull through the vertex without intravenous contrast. COMPARISON:  04/06/2013 FINDINGS: Brain: No acute intracranial abnormality. Specifically, no hemorrhage, hydrocephalus, mass lesion, acute infarction, or significant intracranial injury. Vascular: No hyperdense vessel or unexpected calcification. Skull: No acute calvarial abnormality. Sinuses/Orbits: Visualized paranasal sinuses are clear. Fluid in the right mastoid air cells. Left mastoid air cells clear. Orbital soft tissues unremarkable. Other: None IMPRESSION: No intracranial abnormality.  Right mastoid effusion. Electronically Signed   By: Rolm Baptise M.D.   On: 01/15/2017 10:51   Mr Brain Wo Contrast  Result Date: 01/17/2017 CLINICAL DATA:  Headache EXAM: MRI HEAD WITHOUT CONTRAST TECHNIQUE: Multiplanar, multiecho pulse sequences of the brain and surrounding structures were obtained without intravenous contrast. COMPARISON:  Head CT 01/15/2017 FINDINGS: Brain: The midline structures are normal. There is no focal diffusion restriction to indicate acute infarct. There is multifocal hyperintense T2-weighted signal within the periventricular white matter, most often seen in the setting of chronic microvascular ischemia. No intraparenchymal hematoma or chronic microhemorrhage. Brain volume is normal for age without age-advanced or lobar predominant atrophy. The dura is normal and there is no extra-axial collection. Vascular: Major intracranial arterial and venous sinus flow voids are preserved. Skull and upper cervical spine: The visualized skull base, calvarium, upper cervical spine and extracranial soft tissues are normal. Sinuses/Orbits: No fluid levels or advanced mucosal thickening. Small right mastoid effusion. Right lens replacement. IMPRESSION: Findings of chronic microvascular ischemia without acute abnormality. No specific finding to explain the reported headaches. Electronically Signed   By: Ulyses Jarred M.D.   On: 01/17/2017 19:19       Filed Weights   01/17/17 2104 01/18/17 2105 01/19/17 0106  Weight: 44.1 kg (97 lb 3.6 oz) 44.2 kg (97 lb 7.1 oz) 44.2 kg (97 lb 7.1 oz)     Microbiology: No results found for this or any previous visit (from the past 240 hour(s)).     Blood Culture    Component Value Date/Time   SDES URINE, RANDOM 08/18/2016 1930   SPECREQUEST NONE 08/18/2016 1930   CULT MULTIPLE SPECIES PRESENT, SUGGEST RECOLLECTION (A) 08/18/2016 1930   REPTSTATUS 08/20/2016 FINAL 08/18/2016 1930      Labs: Results for orders placed or performed during the  hospital encounter of 01/11/17 (from the past 48 hour(s))  Basic metabolic panel     Status: Abnormal   Collection Time: 01/18/17  9:55 AM  Result Value Ref Range   Sodium 140 135 - 145 mmol/L   Potassium 5.1 3.5 - 5.1 mmol/L   Chloride 111 101 - 111 mmol/L   CO2 19 (L) 22 - 32 mmol/L   Glucose, Bld 97 65 - 99 mg/dL   BUN 40 (H) 6 - 20 mg/dL   Creatinine, Ser 1.72 (H) 0.44 - 1.00 mg/dL   Calcium 8.9 8.9 - 10.3 mg/dL   GFR calc non Af Amer 26 (L) >60 mL/min   GFR calc Af Amer 30 (L) >60 mL/min    Comment: (NOTE) The eGFR has been calculated using the CKD EPI equation. This calculation has not been validated in all clinical situations. eGFR's persistently <60 mL/min signify possible Chronic Kidney Disease.    Anion gap 10 5 - 15  CBC     Status: Abnormal   Collection Time: 01/18/17  9:55 AM  Result Value Ref Range   WBC 4.9 4.0 - 10.5 K/uL   RBC 2.87 (L) 3.87 - 5.11 MIL/uL   Hemoglobin 8.7 (L) 12.0 - 15.0  g/dL   HCT 27.3 (L) 36.0 - 46.0 %   MCV 95.1 78.0 - 100.0 fL   MCH 30.3 26.0 - 34.0 pg   MCHC 31.9 30.0 - 36.0 g/dL   RDW 16.2 (H) 11.5 - 15.5 %   Platelets 123 (L) 150 - 400 K/uL     Lipid Panel     Component Value Date/Time   CHOL 127 02/25/2012 1636   TRIG 83 02/25/2012 1636   HDL 60 02/25/2012 1636   CHOLHDL 2.1 02/25/2012 1636   VLDL 17 02/25/2012 1636   LDLCALC 50 02/25/2012 1636     No results found for: HGBA1C      HPI   83 y.o.femalewith history of multiple myeloma presently not on treatment since patient was not able to tolerate last November 2017, chronic kidney disease, hypertension and anemia presents to the ER with complaints of worsening pain in the abdomen particularly on the pelvic bones. Patient's pain has been increasing progressively over the last few weeks and last 2-3 weeks and hardly has been able to ambulate. Patient also found to have  ARF and anemia. She developed a headache the day following admission (01/13/17) that is located on the  left side of her head . Patient was evaluated by neurology for the headache. Patient was evaluated by palliative care for pain control    HOSPITAL COURSE:  #  Generalized abdominal pain/pelvic pain in the setting of known multiple myeloma metastasis to the pelvic bones: -CT scan of abdomen and pelvis consistent with metastatic disease. -Pain management Outlined by palliative care with decadron and scheduled Oxycodone and prn oxycodone  and Robaxin  . Currently on Decadron 4 mg a day -Palliative care consult- She remains unwilling to discuss her overall health and advanced care planning needs -PT OT therapy -home health -Follow up with oncologist outpatient. Patient also will be followed by outpatient palliative care services   # Intractable headache: Patient reported pain mostly on the left side. She has no focal neurological deficit. Continue Tylenol, oxycodone  as needed. Patient reported a chronic issue with her vision is unchanged recently. ESR markedly elevated greater than 140, likely the setting of multiple myeloma. Headache is unlikely temporal arteritis based on a lack of scalp, temporal and masticatory pain, CRP within normal limits.  Discussed with the neurology , MRI of the brain essentially negative.    #Symptomatic anemia: Status post 2 units of red blood cell transfusion. Monitor CBC. Hemoglobin is stable. No sign of bleeding.    #Acute kidney injury on CKD stage 3, likely prerenal presenting creatinine 2.75, now 1.3 -Serum creatinine level stable and around baseline. Monitor BMP. Avoid nephrotoxins/ACE inhibitor. -continue sod bicarbonate,      #Hypertension: Blood pressure acceptable. Continue hydralazine, amlodipine.  #Severe protein calorie malnutrition: Dietary referral and encourage oral intake  #Gerd: On protonix.  #Physical deconditioning: PT OT evaluation-will need home health    Discharge Exam  Blood pressure 118/69, pulse 76, temperature 98.6 F (37  C), temperature source Oral, resp. rate 18, height '5\' 5"'$  (1.651 m), weight 44.2 kg (97 lb 7.1 oz), SpO2 100 %.  Cardiovascular system: Regular rate rhythm, S1 is normal. No pedal edema Gastrointestinal system: Abdomen soft, nontender, nondistended. [place]ousand positive Central nervous system: Alert awake and following commands. Muscle strength 5 over 5 in all extremities. Skin: No rashes, lesions or ulcers Psychiatry: Judgement and insight appear normal. Mood & affect appropriate.     Follow-up Information    Axel Filler, MD. Call.  Specialty:  Internal Medicine Why:  Hospital follow-up in 3-5 days Contact information: [address] [place] [address] [place], [place] Follow up.   Specialty:  Home Health Services Why:  Physical therapy, occupational therapy, registered nurse, aide and social worker Contact information: [place] [address] [phone]           Signed: Reyne Dumas 01/19/2017, 10:35 AM        Time spent >1 hour

## 2017-01-19 NOTE — Progress Notes (Signed)
Pt discharge instructions given, pt verbalized understanding.  VSS. Denies pain.  Pt left floor via wheelchair accompanied by staff and family to private residence.

## 2017-01-19 NOTE — Progress Notes (Signed)
Daily Progress Note   Patient Name: Kayla Brooks       Date: 01/19/2017 DOB: [birth year]  Age: 83 y.o. MRN#: [id number] Attending Physician: Reyne Dumas, MD Primary Care Physician: Axel Filler, MD Admit Date: 01/11/2017  Reason for Consultation/Follow-up: Establishing goals of care, Pain control and Psychosocial/spiritual support  Subjective: Kayla Brooks is much more awake and interactive today. She was sleeping on my arrival, but quickly and easily woke up and talked with me. She is endorsing increased pain in her in her pelvis, despite having received oxycodone ~3hrs earlier. She feels this pain is 7/10, which is similar to how it has been (no acutely worse) prior to admission. No headache at present.   Length of Stay: 7  Current Medications: Scheduled Meds:  . amLODipine  5 mg Oral Daily  . dexamethasone  4 mg Oral Daily  . feeding supplement (ENSURE ENLIVE)  237 mL Oral BID BM  . hydrALAZINE  50 mg Oral Q8H  . methocarbamol  500 mg Oral TID  . oxybutynin  5 mg Oral BID  . oxyCODONE  5 mg Oral Q8H  . pantoprazole  40 mg Oral Daily  . polyethylene glycol  17 g Oral Daily  . senna-docusate  1 tablet Oral BID  . sodium bicarbonate  1,300 mg Oral BID    Continuous Infusions:   PRN Meds: acetaminophen, hydrALAZINE, oxyCODONE **AND** oxyCODONE  Physical Exam  Constitutional: She is oriented to person, place, and time. No distress.  Frail elderly female in no acute distress  HENT:  Head: Normocephalic and atraumatic.  Mouth/Throat: Oropharynx is clear and moist and mucous membranes are normal. Abnormal dentition. No oropharyngeal exudate.  Eyes: EOM are normal.  Neck: Normal range of motion. Neck supple.  Cardiovascular: Normal rate and regular rhythm.     Pulmonary/Chest: Effort normal and breath sounds normal.  Abdominal: Soft. Bowel sounds are normal.  Musculoskeletal: She exhibits no edema.  Generalized weakness, but able to reposition self  Neurological: She is alert and oriented to person, place, and time.  Skin: Skin is warm and dry.  Psychiatric: She has a normal mood and affect. Judgment and thought content normal. Her speech is delayed. She is withdrawn. Cognition and memory are normal.  Nursing note and vitals reviewed.          Vital  Signs: BP 121/74 (BP Location: Left Arm)   Pulse 72   Temp 98.5 F (36.9 C) (Oral)   Resp 19   Ht 5' 5" (1.651 m)   Wt 44.2 kg (97 lb 7.1 oz)   SpO2 100%   BMI 16.22 kg/m  SpO2: SpO2: 100 % O2 Device: O2 Device: Not Delivered O2 Flow Rate:    Intake/output summary:   Intake/Output Summary (Last 24 hours) at 01/19/17 6808 Last data filed at 01/19/17 0601  Gross per 24 hour  Intake              420 ml  Output              302 ml  Net              118 ml   LBM: Last BM Date: 01/16/17 Baseline Weight: Weight: 42.3 kg (93 lb 3.2 oz) Most recent weight: Weight: 44.2 kg (97 lb 7.1 oz)  Palliative Assessment/Data: PPS 50-60%   Flowsheet Rows     Most Recent Value  Intake Tab  Unit at Time of Referral  Med/Surg Unit  Palliative Care Primary Diagnosis  Cancer  Date Notified  01/13/17  Palliative Care Type  New Palliative care  Reason for referral  Pain, Clarify Goals of Care  Date of Admission  01/11/17  # of days IP prior to Palliative referral  2  Clinical Assessment  Palliative Performance Scale Score  40%  Psychosocial & Spiritual Assessment  Palliative Care Outcomes      Patient Active Problem List   Diagnosis Date Noted  . Intractable headache   . Palliative care by specialist   . Abdominal pain, lower 01/12/2017  . Normochromic normocytic anemia 01/12/2017  . ARF (acute renal failure) ([place]) 01/12/2017  . Acute kidney injury (nontraumatic) ([place])   . Healthcare  maintenance 01/02/2015  . Urge incontinence 12/11/2014  . Multiple myeloma ([place]) 03/27/2009  . Essential hypertension 05/04/2006  . GERD (gastroesophageal reflux disease) 05/04/2006    Palliative Care Assessment & Plan   Patient Profile: Kayla Brooks a 83 y.o.femalewith history of multiple myeloma presently not on treatment since patient was not able to tolerate last November 2017. She also has chronic kidney disease, hypertension and anemia. She presents to the ER with complaints of worsening pain in the abdomen particularly on the pelvic bones. Patient's pain has been increasing progressively over the last few weeks and last 2-3 weeks and hardly has been able to ambulate. Denies any nausea vomiting or diarrhea. Denies any chest pain or shortness of breath.Imaging revealed likely progression of myeloma. She has been followed by Dr. Alen Blew and declined further treatment. Palliative consulted to assist in clarifying goals of care   Assessment  On 7/31 Salwa was extremely lethargic/sedated secondary to changes made to her pain medication regimen. I converted back to the regimen the day before, which had been effective in managing her pain. This morning she still feels rather tired, but is easily able to wake up and verbally engage with me. I explained the pain medication adjustment, and reinforced the plan to have scheduled dosing and PRN doses for breakthrough pain. I explained that she would need to ask for the PRN doses, and I would track her use as a way to know if the scheduled dosing needed to be adjusted (in either strength or frequency). She acknowledged this plan and verbalized it back to me.   I also talked with her again about the plan once she  leaves the hospital, and the plan for managing her cancer and related symptoms. She did open-up a bit more today. She explained that she is, at this point, taking it one day at a time. She is not seeking out further treatment right now, but  she is not sure if that might change in the future. Her focus is on feeling better and spending time with her friends and family. As we discussed that goal I re-emphasized the role that Hospice could play in helping her achieve that. I explained their philosophy and the services they could provide. Mallika was receptive to this information, but felt like she needed more time to think about it. She could not clarify what exactly she was thinking about, but felt like she needed more time to process everything. That said, she was agreeable to an outpatient Palliative Care referral. I shared that Palliative Care could continue talking with her about it, and facilitate the transition to Hospice level support (which is much more robust) once she was ready.   I did attempt to discuss code status again. She asked for time to rest and did not feel "up to discussing that right now."   Recommendations/Plan:  Pain: Nadelyn has a variable response to pain medication and I suspect will not do well with long acting and PRN dosing. I changed her back to her pain regimen from 7/30, which so far has been the simplest and proven most effective: Oxycodone 37m q8H and Oxycodone [id number] PRN for breakthrough pain. I would also continue the Decadron. She has declined starting scheduled Tylenol, though I think this may help as well.    GOC: MaRileyas able to tell me her goal of spending time with friends and family, but is not yet in a place to fully discuss her cancer and what the future might look like. I believe her coping strategy is focusing on the day to day, and she is not yet able to plan/prepare for the future/advanced care planning. I suspect she will need an outpatient f/u visit with Oncology to facilitate these discussions. She did, however, agree to an outpatient palliative care referral (CM consult placed to help set this up)   Discharge: I agree that [facility] with unknown support makes me  uncomfortable. That said, if she is deemed decisional (and no one has questioned her capacity per chart review), she has the right to refuse SNF, Hospice, and [place]aking with family/friends (which I have offered to do on multiple occasions).   Goals of Care and Additional Recommendations:  Limitations on Scope of Treatment: Full Scope Treatment  Code Status:    Code Status Orders        Start     Ordered   01/12/17 0300  Full code  Continuous     01/12/17 0300    Code Status History    Date Active Date Inactive Code Status Order ID Comments User Context   06/10/2015  6:03 PM 06/11/2015  7:37 PM Full Code [id number] LollMD ED      Prognosis:   < 6 months in the setting of progressive multiple myeloma for which no treatment is being sought.   Discharge Planning:  Home with Home Health and Palliative Care referral.  Care plan was discussed with pt.   Thank you for allowing the Palliative Medicine Team to assist in the care of this patient.  Total time: 35 minutes    Greater than 50%  of  this time was spent counseling and coordinating care related to the above assessment and plan.  Jannette Fogo, NP  Please contact Palliative Medicine Team phone at [phone] for questions and concerns.

## 2017-01-19 NOTE — Care Management Important Message (Signed)
Important Message  Patient Details  Name: Kayla Brooks MRN: [id number] Date of Birth: 05-15-1930   Medicare Important Message Given:  Yes    Monnie Gudgel 01/19/2017, 11:50 AM

## 2017-01-21 DIAGNOSIS — C7951 Secondary malignant neoplasm of bone: Secondary | ICD-10-CM | POA: Diagnosis not present

## 2017-01-21 DIAGNOSIS — I700 Atherosclerosis of aorta: Secondary | ICD-10-CM | POA: Diagnosis not present

## 2017-01-21 DIAGNOSIS — Z96641 Presence of right artificial hip joint: Secondary | ICD-10-CM | POA: Diagnosis not present

## 2017-01-21 DIAGNOSIS — E46 Unspecified protein-calorie malnutrition: Secondary | ICD-10-CM | POA: Diagnosis not present

## 2017-01-21 DIAGNOSIS — C9000 Multiple myeloma not having achieved remission: Secondary | ICD-10-CM | POA: Diagnosis not present

## 2017-01-21 DIAGNOSIS — D649 Anemia, unspecified: Secondary | ICD-10-CM | POA: Diagnosis not present

## 2017-01-21 DIAGNOSIS — Z9181 History of falling: Secondary | ICD-10-CM | POA: Diagnosis not present

## 2017-01-21 DIAGNOSIS — G893 Neoplasm related pain (acute) (chronic): Secondary | ICD-10-CM | POA: Diagnosis not present

## 2017-01-21 DIAGNOSIS — Z79891 Long term (current) use of opiate analgesic: Secondary | ICD-10-CM | POA: Diagnosis not present

## 2017-01-21 DIAGNOSIS — I129 Hypertensive chronic kidney disease with stage 1 through stage 4 chronic kidney disease, or unspecified chronic kidney disease: Secondary | ICD-10-CM | POA: Diagnosis not present

## 2017-01-21 DIAGNOSIS — N183 Chronic kidney disease, stage 3 (moderate): Secondary | ICD-10-CM | POA: Diagnosis not present

## 2017-01-24 ENCOUNTER — Telehealth: Payer: Self-pay | Admitting: *Deleted

## 2017-01-24 NOTE — Telephone Encounter (Signed)
Call from [facility]. States was discharged from the hospital and she made a visit on Friday - wants to confirm plan of care and continue seeing pt. States pt lives alone and her son died last week. Also they had received drug interaction notice between Simvastatin and Norvasc. Inform pt's last appt was 11/10/15 - states she will talk to pt about scheduling an appt with Dr Evette Doffing. I will inform Dr Evette Doffing.

## 2017-01-24 NOTE — Telephone Encounter (Signed)
Ok, it would be good to get her in to see me. You can add her on to my schedule next Monday, ok to double book an early time slot. Or she can come to the [facility] for a hospital follow up appointment if she cannot make Monday. I am ok with the simvastatin and norvasc combination for now.

## 2017-01-25 NOTE — Telephone Encounter (Signed)
Just spoke with patient.  She prefers late afternoon appointments.  She agreed to an appointment in [facility] next Wednesday 02/02/2017 at 3:45 pm and Dr. Evette Doffing will be attending that afternoon.

## 2017-01-25 NOTE — Telephone Encounter (Signed)
Message sent to front office to schedule pt an appt. 

## 2017-01-26 ENCOUNTER — Other Ambulatory Visit: Payer: Self-pay

## 2017-01-26 ENCOUNTER — Ambulatory Visit (HOSPITAL_BASED_OUTPATIENT_CLINIC_OR_DEPARTMENT_OTHER): Payer: Medicare Other | Admitting: Oncology

## 2017-01-26 ENCOUNTER — Telehealth: Payer: Self-pay

## 2017-01-26 VITALS — BP 131/54 | HR 67 | Temp 99.3°F | Resp 18 | Wt 100.6 lb

## 2017-01-26 DIAGNOSIS — C9002 Multiple myeloma in relapse: Secondary | ICD-10-CM

## 2017-01-26 DIAGNOSIS — M899 Disorder of bone, unspecified: Secondary | ICD-10-CM | POA: Diagnosis not present

## 2017-01-26 DIAGNOSIS — D630 Anemia in neoplastic disease: Secondary | ICD-10-CM

## 2017-01-26 DIAGNOSIS — G893 Neoplasm related pain (acute) (chronic): Secondary | ICD-10-CM | POA: Diagnosis not present

## 2017-01-26 LAB — CBC WITH DIFFERENTIAL/PLATELET
BASO%: 0 % (ref 0.0–2.0)
BASOS ABS: 0 10*3/uL (ref 0.0–0.1)
EOS%: 0 % (ref 0.0–7.0)
Eosinophils Absolute: 0 10*3/uL (ref 0.0–0.5)
HEMATOCRIT: 20.8 % — AB (ref 34.8–46.6)
HEMOGLOBIN: 6.6 g/dL — AB (ref 11.6–15.9)
LYMPH#: 1.2 10*3/uL (ref 0.9–3.3)
LYMPH%: 20.3 % (ref 14.0–49.7)
MCH: 31.3 pg (ref 25.1–34.0)
MCHC: 31.7 g/dL (ref 31.5–36.0)
MCV: 98.6 fL (ref 79.5–101.0)
MONO#: 0.4 10*3/uL (ref 0.1–0.9)
MONO%: 6.2 % (ref 0.0–14.0)
NEUT#: 4.5 10*3/uL (ref 1.5–6.5)
NEUT%: 73.5 % (ref 38.4–76.8)
NRBC: 0 % (ref 0–0)
Platelets: 103 10*3/uL — ABNORMAL LOW (ref 145–400)
RBC: 2.11 10*6/uL — ABNORMAL LOW (ref 3.70–5.45)
RDW: 15.8 % — AB (ref 11.2–14.5)
WBC: 6.1 10*3/uL (ref 3.9–10.3)

## 2017-01-26 NOTE — Telephone Encounter (Signed)
Kayla Brooks with [facility] want to informed the doctor, pt do not a nursing visit today.

## 2017-01-26 NOTE — Progress Notes (Signed)
Hematology and Oncology Follow Up Visit  Kayla Brooks [id number] Jan 28, 1930 83 y.o. 01/26/2017 5:32 PM  CC: Kayla Ser, MD  Kayla Brooks. Kayla Blalock, MD, [facility]  Kayla Brooks, M.D., Ph.D.    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  She had a light chain multiple myeloma with M spike of about 0.35 gm/dL.  Prior Therapy: 1. Status post prophylactic fixation impending pathological fracture of the right femur done in August 2009. 2. External beam radiation concluded in 2010 at the right femur. 3. Revlimid 25 mg daily as maintenance therapy for 2 years ending 03/2012.  Current therapy:   Revlimid 10 mg daily started in October 2017. Therapy discontinued in November 2018 due to poor tolerance. She is currently on supportive care only.  Interim History:  [name] presents today for a followup visit. Since her last visit, she continues to report to have further decline in her health. She was hospitalized in July 2018 and was discharged on 01/19/2017. She was hospitalized for abdominal pain and failure to thrive and she was found to have worsening anemia and worsening renal insufficiency. She received packed red cell transfusion and has been staying at home since her discharge. She is ambulating very short distances with the help of a cane without any falls or syncope. She reports her pain is manageable.  She denied any hematochezia or melena. She denied any pathological fractures or increased pain. Her appetite has been poor although she is eating slightly better on dexamethasone. She is developing lower extremity edema.  She does not report any headaches, blurry vision or syncope. She does not report any fevers or chills or sweats.  she does not report any chest pain, palpitation, orthopnea or leg edema. She does not report any cough, wheezing or  hemoptysis. She does not report any urinary symptoms of frequency urgency or hesitancy. Remainder of her review of systems unremarkable.   Medications: I have reviewed the patient's current medications.  Current Outpatient Prescriptions  Medication Sig Dispense Refill  . amLODipine (NORVASC) 5 MG tablet take 1 tablet by mouth once daily 90 tablet 3  . dexamethasone (DECADRON) 4 MG tablet Take 1 tablet (4 mg total) by mouth daily. 30 tablet 1  . diclofenac sodium (VOLTAREN) 1 % GEL Apply 2 g topically 4 (four) times daily. (Patient taking differently: Apply 2 g topically 4 (four) times daily as needed (pain). ) 100 g 0  . feeding supplement, ENSURE ENLIVE, (ENSURE ENLIVE) LIQD Take 237 mLs by mouth 2 (two) times daily between meals. 237 mL 12  . hydrALAZINE (APRESOLINE) 50 MG tablet Take 1 tablet (50 mg total) by mouth every 8 (eight) hours. 90 tablet 1  . methocarbamol (ROBAXIN) 500 MG tablet Take 1 tablet (500 mg total) by mouth 3 (three) times daily. 30 tablet 1  . nitroGLYCERIN (NITROSTAT) 0.4 MG SL tablet Place 0.4 mg under the tongue every 5 (five) minutes as needed for chest pain.    Marland Kitchen omeprazole (PRILOSEC) 20 MG capsule take 1 capsule by mouth once daily 30 capsule 2  . oxybutynin (DITROPAN) 5 MG tablet take 1 tablet by mouth twice a day 60 tablet 1  . oxyCODONE (OXY IR/ROXICODONE) 5 MG immediate release tablet 5 mg TID, then 5 mg po q 4 prn 30 tablet 0  . pantoprazole (PROTONIX) 40 MG tablet Take 1 tablet (40 mg  total) by mouth daily. 30 tablet 1  . polyethylene glycol (MIRALAX / GLYCOLAX) packet Take 17 g by mouth daily. 14 each 0  . prochlorperazine (COMPAZINE) 10 MG tablet Take 1 tablet (10 mg total) by mouth every 6 (six) hours as needed for nausea or vomiting. 30 tablet 0  . senna-docusate (SENOKOT-S) 8.6-50 MG tablet Take 1 tablet by mouth 2 (two) times daily. 30 tablet 1  . simvastatin (ZOCOR) 20 MG tablet take 1 tablet by mouth at bedtime 30 tablet 5  . sodium bicarbonate 650 MG  tablet Take 1 tablet (650 mg total) by mouth 2 (two) times daily. 60 tablet 2   No current facility-administered medications for this visit.   , Allergies:  Allergies  Allergen Reactions  . Morphine And Related Other (See Comments)    Abdominal Pain    Past Medical History, Surgical history, Social history, and Family History were reviewed and updated.   Physical Exam: ECOG: 3 General appearance: Chronically ill-appearing woman appeared without distress today. Head: Normocephalic, without obvious abnormality. No oral ulcers or lesions. Neck: no adenopathy Lymph nodes: Cervical, supraclavicular, and axillary nodes normal. Heart:regular rate and rhythm, S1, S2 normal, no murmur, click, rub or gallop Lung:chest clear, no wheezing, rales, normal symmetric air entry Abdomen: soft, non-tender, without masses or organomegaly no rebound or guarding. EXT: Edema at the level of the ankle noted bilaterally.   Skin: No rash or lesions. Lab Results: Lab Results  Component Value Date   WBC 6.1 01/26/2017   HGB 6.6 (LL) 01/26/2017   HCT 20.8 (L) 01/26/2017   MCV 98.6 01/26/2017   PLT 103 (L) 01/26/2017        Impression and Plan:  83 year old woman with the following issues:  1. Light chain multiple myeloma currently progressing. She has been intolerant to treatment in the past and refused any aggressive therapy. Her disease appeared to be progressing with worsening anemia, renal insufficiency and bone disease. Her CT scan on 01/12/2017 was reviewed and showed clear bony disease progression.  This was discussed with her today in detail and she understands that her disease is worse. Given her poor performance status and overall debilitation, I have recommended hospice at this time without any further aggressive measures. She will consider this option and she understands she has limited life expectancy.  2. Anemia: Appears to be related to plasma cell disorder and that renal  insufficiency. She is asymptomatic at this time and we'll hold off on any transfusions.  3. Pain: Appears to be adequately managed at this time.  4. Prognosis: Poor with limited life expectancy. She is a hospice candidate at this time.  5. Follow-up in the next few months to follow her progress and sooner if needed.    Zola Button, MD 8/8/20185:32 PM

## 2017-01-27 ENCOUNTER — Ambulatory Visit (HOSPITAL_COMMUNITY)
Admission: RE | Admit: 2017-01-27 | Discharge: 2017-01-27 | Disposition: A | Discharge status: Home / Self Care | Payer: Medicare Other | Source: Ambulatory Visit | Attending: Oncology | Admitting: Oncology

## 2017-01-27 ENCOUNTER — Telehealth: Payer: Self-pay | Admitting: Oncology

## 2017-01-27 ENCOUNTER — Telehealth: Payer: Self-pay

## 2017-01-27 DIAGNOSIS — R9431 Abnormal electrocardiogram [ECG] [EKG]: Secondary | ICD-10-CM | POA: Insufficient documentation

## 2017-01-27 DIAGNOSIS — E875 Hyperkalemia: Secondary | ICD-10-CM | POA: Insufficient documentation

## 2017-01-27 NOTE — Telephone Encounter (Signed)
Geni Bers with [facility] requesting Vo. Please call back.

## 2017-01-27 NOTE — Telephone Encounter (Signed)
Spoke with patient about upcoming appointments in Oct.   Sending her a reminder letter.

## 2017-01-28 NOTE — Telephone Encounter (Signed)
VO given to hh CSW for start of care to begin next week, pt would like to postpone due to a very overwhelming week she feels next week would be a little more settled. VO given, do you approve?

## 2017-01-28 NOTE — Telephone Encounter (Signed)
Yes, thank you.

## 2017-01-31 DIAGNOSIS — C9000 Multiple myeloma not having achieved remission: Secondary | ICD-10-CM | POA: Diagnosis not present

## 2017-01-31 DIAGNOSIS — Z96641 Presence of right artificial hip joint: Secondary | ICD-10-CM | POA: Diagnosis not present

## 2017-01-31 DIAGNOSIS — Z9181 History of falling: Secondary | ICD-10-CM | POA: Diagnosis not present

## 2017-01-31 DIAGNOSIS — E46 Unspecified protein-calorie malnutrition: Secondary | ICD-10-CM | POA: Diagnosis not present

## 2017-01-31 DIAGNOSIS — Z79891 Long term (current) use of opiate analgesic: Secondary | ICD-10-CM | POA: Diagnosis not present

## 2017-01-31 DIAGNOSIS — G893 Neoplasm related pain (acute) (chronic): Secondary | ICD-10-CM | POA: Diagnosis not present

## 2017-01-31 DIAGNOSIS — N183 Chronic kidney disease, stage 3 (moderate): Secondary | ICD-10-CM | POA: Diagnosis not present

## 2017-01-31 DIAGNOSIS — I129 Hypertensive chronic kidney disease with stage 1 through stage 4 chronic kidney disease, or unspecified chronic kidney disease: Secondary | ICD-10-CM | POA: Diagnosis not present

## 2017-01-31 DIAGNOSIS — D649 Anemia, unspecified: Secondary | ICD-10-CM | POA: Diagnosis not present

## 2017-01-31 DIAGNOSIS — C7951 Secondary malignant neoplasm of bone: Secondary | ICD-10-CM | POA: Diagnosis not present

## 2017-01-31 DIAGNOSIS — I700 Atherosclerosis of aorta: Secondary | ICD-10-CM | POA: Diagnosis not present

## 2017-02-01 ENCOUNTER — Telehealth: Payer: Self-pay | Admitting: Student in an Organized Health Care Education/Training Program

## 2017-02-01 NOTE — Telephone Encounter (Signed)
PT from [facility] requesting VO for PT 2X 1 for 4wks for Strengtehing,gait,balance and home safety.  Please call back.

## 2017-02-02 ENCOUNTER — Ambulatory Visit (INDEPENDENT_AMBULATORY_CARE_PROVIDER_SITE_OTHER): Payer: Medicare Other | Admitting: Internal Medicine

## 2017-02-02 ENCOUNTER — Encounter: Payer: Self-pay | Admitting: Internal Medicine

## 2017-02-02 VITALS — BP 133/56 | HR 62 | Temp 98.1°F | Ht 65.0 in | Wt 110.3 lb

## 2017-02-02 DIAGNOSIS — C7951 Secondary malignant neoplasm of bone: Secondary | ICD-10-CM | POA: Diagnosis not present

## 2017-02-02 DIAGNOSIS — I129 Hypertensive chronic kidney disease with stage 1 through stage 4 chronic kidney disease, or unspecified chronic kidney disease: Secondary | ICD-10-CM | POA: Diagnosis not present

## 2017-02-02 DIAGNOSIS — Z9181 History of falling: Secondary | ICD-10-CM | POA: Diagnosis not present

## 2017-02-02 DIAGNOSIS — D649 Anemia, unspecified: Secondary | ICD-10-CM | POA: Diagnosis not present

## 2017-02-02 DIAGNOSIS — E875 Hyperkalemia: Secondary | ICD-10-CM

## 2017-02-02 DIAGNOSIS — R1030 Lower abdominal pain, unspecified: Secondary | ICD-10-CM

## 2017-02-02 DIAGNOSIS — I700 Atherosclerosis of aorta: Secondary | ICD-10-CM | POA: Diagnosis not present

## 2017-02-02 DIAGNOSIS — G893 Neoplasm related pain (acute) (chronic): Secondary | ICD-10-CM | POA: Diagnosis not present

## 2017-02-02 DIAGNOSIS — Z79891 Long term (current) use of opiate analgesic: Secondary | ICD-10-CM | POA: Diagnosis not present

## 2017-02-02 DIAGNOSIS — C9000 Multiple myeloma not having achieved remission: Secondary | ICD-10-CM | POA: Diagnosis not present

## 2017-02-02 DIAGNOSIS — E46 Unspecified protein-calorie malnutrition: Secondary | ICD-10-CM | POA: Diagnosis not present

## 2017-02-02 DIAGNOSIS — N183 Chronic kidney disease, stage 3 (moderate): Secondary | ICD-10-CM | POA: Diagnosis not present

## 2017-02-02 DIAGNOSIS — Z96641 Presence of right artificial hip joint: Secondary | ICD-10-CM | POA: Diagnosis not present

## 2017-02-02 NOTE — Patient Instructions (Addendum)
It was nice to meet you today, [name].  We will get in touch with your home health nurse to verify medications you are currently taking and order a shower chair for you.  Remember to elevate your legs to help with the swelling. You can also continue using Epsom salt for this.  I will give you a call if your blood test results are normal.

## 2017-02-02 NOTE — Telephone Encounter (Signed)
Yes.  Thank you.

## 2017-02-02 NOTE — Telephone Encounter (Signed)
VO given for PT 2X/wk for 4wks for Strengthening,gait,balance and home safety.  Do you agree? Thank you!

## 2017-02-02 NOTE — Progress Notes (Signed)
   CC: Hospital follow-up for abdominal pain  HPI:  [name] is a 83 y.o. female with PMH as described below who presents to clinic for hospital follow-up. 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 Please see problem based assessment and plan for further details.  Past Medical History:  Diagnosis Date  . Arthritis    "right knee" (06/10/2015)  . Chronic anemia   . Chronic diarrhea    Archie Endo 06/10/2015  . GERD (gastroesophageal reflux disease)   . Hyperlipidemia   . Hypertension   . Monoclonal paraproteinemia   . Multiple myeloma without mention of remission   . Plasmacytoma of bone ([place])    right femur/notes 06/04/2015  . Pneumonia    "years ago" (06/10/2015)  . Urinary frequency    Review of Systems:   Review of Systems  Constitutional: Negative for chills, fever and weight loss.  Respiratory: Negative for cough and shortness of breath.   Cardiovascular: Positive for leg swelling. Negative for chest pain and palpitations.  Gastrointestinal: Negative for abdominal pain, blood in stool, constipation, diarrhea, melena, nausea and vomiting.    Physical Exam:  Vitals:   02/02/17 1616  BP: (!) 133/56  Pulse: 62  Temp: 98.1 F (36.7 C)  TempSrc: Oral  SpO2: 100%  Weight: 110 lb 4.8 oz (50 kg)  Height: '5\' 5"'$  (1.651 m)    General: Pleasant female, thin, sitting up in chair sleeping, in no acute distress HENT: NCAT, neck supple and FROM  Cardiac: regular rate and rhythm, nl S1/S2, no murmurs, rubs or gallops  Pulm: CTAB, no wheezes or crackles, no increased work of breathing  Abd: soft, NTND, bowel sounds present   Ext: warm and well perfused, 3+ peripheral edema, unable to palpate distal pulses Derm: No skin breakdown or wounds noted   Assessment & Plan:   See Encounters Tab for problem based charting.  Patient seen with Dr. Lynnae January

## 2017-02-02 NOTE — Assessment & Plan Note (Addendum)
Patient presenting for hospital follow-up. She has a history of MM and was admitted for abdominal pain on 7/24 and found to have pelvic bone metastasis on CT. She is currently not on treatment for MM given intolerance to medication. She was seen by palliative care during admission and was started on Decadron 4 mg, scheduled and PRN oxycodone, and Robaxin for pain management. She reports complete resolution of abdominal pain today and states she has not been taking the pain medication because she has not been in pain. However, upon further questioning she is unable to remember what medications she is on. States she lives alone but has a son who comes and help her some times. Reports she is able to perform activities of daily living with the exception of getting in and out of the bathtub. She was set up with home health prior to discharge but states that RN has only visited once. On exam her feet and ankles are markedly swollen. Patient reports this is not new but unable to tell when this started. Per review of chart seems like patient had ankle edema on admission that has now worsened. Able to ambulate with a cane but having increasing difficulty due to swelling.  One of her sons is present in the room and states prior to this admission patient's appetite was decreased. Suspect this could be the cause of ankle swelling along with acute renal failure on admission that has now exacerbated in the setting of starting Decadron. She is also on amlodipine for hypertension which can also cause lower extremity swelling, though suspect this is less likely given that she has been on this medication chronically. No history of heart, kidney, or liver disease. Creatinine 1.7 on discharge.  - CBC and CMP today. Will call patient if lab results are abnormal. - We will contact home health nurse for med reconciliation and order a shower chair. We will not make any changes in her medication at this time. - Patient instructed to  keep legs elevated and continue using Epsom salt.   CMP with K of 5.7 from 5.1 2 weeks prior. Kidney function improving with creatinine 1.4 from 1.72. Asked patient if she could come to clinic today to recheck potassium but she has no transportation available at this time. Stated that she can come in tomorrow morning to have her potassium rechecked BMP has been ordered.  Spoke to patient's home health nurse, Nicki Reaper, on 8/16. Patient is currently on amlodipine 5 mg, hydralazine 50 g TID, Decadron 4 mg, OxyIR 5 mg, protonic 40 mg daily, Compazine PRN, Senokot, simvastatin 20 mg, and sodium bicarbonate 650 mg. He will talk to patient's physical therapist for evaluation of shower chair.

## 2017-02-03 LAB — CBC
HEMOGLOBIN: 8.1 g/dL — AB (ref 11.1–15.9)
Hematocrit: 24.8 % — ABNORMAL LOW (ref 34.0–46.6)
MCH: 31.2 pg (ref 26.6–33.0)
MCHC: 32.7 g/dL (ref 31.5–35.7)
MCV: 95 fL (ref 79–97)
Platelets: 186 10*3/uL (ref 150–379)
RBC: 2.6 x10E6/uL — CL (ref 3.77–5.28)
RDW: 15.9 % — AB (ref 12.3–15.4)
WBC: 5.8 10*3/uL (ref 3.4–10.8)

## 2017-02-03 LAB — CMP14 + ANION GAP
A/G RATIO: 0.8 — AB (ref 1.2–2.2)
ALBUMIN: 3.1 g/dL — AB (ref 3.5–4.7)
ALT: 51 IU/L — ABNORMAL HIGH (ref 0–32)
AST: 44 IU/L — ABNORMAL HIGH (ref 0–40)
Alkaline Phosphatase: 152 IU/L — ABNORMAL HIGH (ref 39–117)
Anion Gap: 15 mmol/L (ref 10.0–18.0)
BUN/Creatinine Ratio: 32 — ABNORMAL HIGH (ref 12–28)
BUN: 46 mg/dL — AB (ref 8–27)
Bilirubin Total: 0.3 mg/dL (ref 0.0–1.2)
CHLORIDE: 105 mmol/L (ref 96–106)
CO2: 21 mmol/L (ref 20–29)
Calcium: 8.6 mg/dL — ABNORMAL LOW (ref 8.7–10.3)
Creatinine, Ser: 1.44 mg/dL — ABNORMAL HIGH (ref 0.57–1.00)
GFR, EST AFRICAN AMERICAN: 38 mL/min/{1.73_m2} — AB (ref >59)
GFR, EST NON AFRICAN AMERICAN: 33 mL/min/{1.73_m2} — AB (ref >59)
GLUCOSE: 94 mg/dL (ref 65–99)
Globulin, Total: 4 g/dL (ref 1.5–4.5)
POTASSIUM: 5.7 mmol/L — AB (ref 3.5–5.2)
Sodium: 141 mmol/L (ref 134–144)
TOTAL PROTEIN: 7.1 g/dL (ref 6.0–8.5)

## 2017-02-03 NOTE — Addendum Note (Signed)
Addended by: Welford Roche on: 02/03/2017 09:43 AM   Modules accepted: Orders

## 2017-02-04 ENCOUNTER — Ambulatory Visit (INDEPENDENT_AMBULATORY_CARE_PROVIDER_SITE_OTHER): Payer: Medicare Other | Admitting: Internal Medicine

## 2017-02-04 ENCOUNTER — Other Ambulatory Visit (INDEPENDENT_AMBULATORY_CARE_PROVIDER_SITE_OTHER): Payer: Medicare Other

## 2017-02-04 ENCOUNTER — Observation Stay (HOSPITAL_COMMUNITY)
Admission: AD | Admit: 2017-02-04 | Discharge: 2017-02-05 | Disposition: A | Discharge status: Home / Self Care | Payer: Medicare Other | Source: Ambulatory Visit | Attending: Internal Medicine | Admitting: Internal Medicine

## 2017-02-04 ENCOUNTER — Encounter (HOSPITAL_COMMUNITY): Payer: Self-pay | Admitting: *Deleted

## 2017-02-04 VITALS — BP 192/64 | HR 55 | Temp 98.7°F | Ht 65.0 in | Wt 108.1 lb

## 2017-02-04 DIAGNOSIS — E875 Hyperkalemia: Secondary | ICD-10-CM | POA: Diagnosis not present

## 2017-02-04 DIAGNOSIS — Z79899 Other long term (current) drug therapy: Secondary | ICD-10-CM | POA: Diagnosis not present

## 2017-02-04 DIAGNOSIS — Z885 Allergy status to narcotic agent status: Secondary | ICD-10-CM | POA: Diagnosis not present

## 2017-02-04 DIAGNOSIS — G893 Neoplasm related pain (acute) (chronic): Secondary | ICD-10-CM | POA: Diagnosis not present

## 2017-02-04 DIAGNOSIS — D630 Anemia in neoplastic disease: Secondary | ICD-10-CM | POA: Diagnosis not present

## 2017-02-04 DIAGNOSIS — N183 Chronic kidney disease, stage 3 (moderate): Secondary | ICD-10-CM | POA: Diagnosis not present

## 2017-02-04 DIAGNOSIS — D649 Anemia, unspecified: Secondary | ICD-10-CM | POA: Diagnosis present

## 2017-02-04 DIAGNOSIS — K219 Gastro-esophageal reflux disease without esophagitis: Secondary | ICD-10-CM | POA: Diagnosis present

## 2017-02-04 DIAGNOSIS — Z66 Do not resuscitate: Secondary | ICD-10-CM | POA: Diagnosis not present

## 2017-02-04 DIAGNOSIS — C7951 Secondary malignant neoplasm of bone: Secondary | ICD-10-CM | POA: Diagnosis present

## 2017-02-04 DIAGNOSIS — C9000 Multiple myeloma not having achieved remission: Secondary | ICD-10-CM | POA: Diagnosis present

## 2017-02-04 DIAGNOSIS — I10 Essential (primary) hypertension: Secondary | ICD-10-CM | POA: Diagnosis present

## 2017-02-04 DIAGNOSIS — R9431 Abnormal electrocardiogram [ECG] [EKG]: Secondary | ICD-10-CM | POA: Diagnosis not present

## 2017-02-04 DIAGNOSIS — I129 Hypertensive chronic kidney disease with stage 1 through stage 4 chronic kidney disease, or unspecified chronic kidney disease: Secondary | ICD-10-CM | POA: Insufficient documentation

## 2017-02-04 LAB — BASIC METABOLIC PANEL
ANION GAP: 6 (ref 5–15)
BUN: 58 mg/dL — AB (ref 6–20)
CALCIUM: 8.8 mg/dL — AB (ref 8.9–10.3)
CO2: 24 mmol/L (ref 22–32)
CREATININE: 1.64 mg/dL — AB (ref 0.44–1.00)
Chloride: 107 mmol/L (ref 101–111)
GFR calc Af Amer: 32 mL/min — ABNORMAL LOW (ref >60)
GFR calc non Af Amer: 27 mL/min — ABNORMAL LOW (ref >60)
GLUCOSE: 111 mg/dL — AB (ref 65–99)
Potassium: 6.5 mmol/L (ref 3.5–5.1)
Sodium: 137 mmol/L (ref 135–145)

## 2017-02-04 MED ORDER — AMLODIPINE BESYLATE 5 MG PO TABS
5.0000 mg | ORAL_TABLET | Freq: Every day | ORAL | Status: DC
  Administered 2017-02-05: 5 mg via ORAL
  Filled 2017-02-04: qty 1

## 2017-02-04 MED ORDER — HEPARIN SODIUM (PORCINE) 5000 UNIT/ML IJ SOLN
5000.0000 [IU] | Freq: Three times a day (TID) | INTRAMUSCULAR | Status: DC
  Administered 2017-02-04 – 2017-02-05 (×3): 5000 [IU] via SUBCUTANEOUS
  Filled 2017-02-04 (×3): qty 1

## 2017-02-04 MED ORDER — ENSURE ENLIVE PO LIQD: 237.0000 mL | Freq: Two times a day (BID) | ORAL | Status: DC

## 2017-02-04 MED ORDER — OXYCODONE HCL 5 MG PO TABS
5.0000 mg | ORAL_TABLET | ORAL | Status: DC | PRN
Start: 2017-02-04 — End: 2017-02-05
  Administered 2017-02-05: 5 mg via ORAL
  Filled 2017-02-04: qty 1

## 2017-02-04 MED ORDER — PROCHLORPERAZINE MALEATE 10 MG PO TABS: 10.0000 mg | ORAL_TABLET | Freq: Four times a day (QID) | ORAL | Status: DC | PRN

## 2017-02-04 MED ORDER — SODIUM CHLORIDE 0.9% FLUSH
3.0000 mL | Freq: Two times a day (BID) | INTRAVENOUS | Status: DC
  Administered 2017-02-04 – 2017-02-05 (×2): 3 mL via INTRAVENOUS

## 2017-02-04 MED ORDER — POLYETHYLENE GLYCOL 3350 17 G PO PACK
17.0000 g | PACK | Freq: Every day | ORAL | Status: DC
  Filled 2017-02-04: qty 1

## 2017-02-04 MED ORDER — SIMVASTATIN 20 MG PO TABS
20.0000 mg | ORAL_TABLET | Freq: Every day | ORAL | Status: DC
  Administered 2017-02-05: 20 mg via ORAL
  Filled 2017-02-04: qty 1

## 2017-02-04 MED ORDER — SODIUM POLYSTYRENE SULFONATE 15 GM/60ML PO SUSP
30.0000 g | Freq: Once | ORAL | Status: AC
  Administered 2017-02-04: 30 g via ORAL

## 2017-02-04 MED ORDER — DEXAMETHASONE 4 MG PO TABS
4.0000 mg | ORAL_TABLET | Freq: Every day | ORAL | Status: DC
  Administered 2017-02-05: 4 mg via ORAL
  Filled 2017-02-04: qty 1

## 2017-02-04 MED ORDER — PANTOPRAZOLE SODIUM 40 MG PO TBEC
40.0000 mg | DELAYED_RELEASE_TABLET | Freq: Every day | ORAL | Status: DC
  Administered 2017-02-05: 40 mg via ORAL
  Filled 2017-02-04: qty 1

## 2017-02-04 MED ORDER — SENNOSIDES-DOCUSATE SODIUM 8.6-50 MG PO TABS
1.0000 | ORAL_TABLET | Freq: Two times a day (BID) | ORAL | Status: DC
  Filled 2017-02-04 (×2): qty 1

## 2017-02-04 MED ORDER — HYDRALAZINE HCL 50 MG PO TABS
50.0000 mg | ORAL_TABLET | Freq: Three times a day (TID) | ORAL | Status: DC
  Administered 2017-02-04 – 2017-02-05 (×4): 50 mg via ORAL
  Filled 2017-02-04 (×4): qty 1

## 2017-02-04 NOTE — Addendum Note (Signed)
Addended by: Sander Nephew F on: 02/04/2017 11:33 AM   Modules accepted: Orders

## 2017-02-04 NOTE — Progress Notes (Signed)
Internal Medicine Clinic Attending  I saw and evaluated the patient.  I personally confirmed the key portions of the history and exam documented by Dr. Santos-Sanchez and I reviewed pertinent patient test results.  The assessment, diagnosis, and plan were formulated together and I agree with the documentation in the resident's note. 

## 2017-02-04 NOTE — Addendum Note (Signed)
Addended by: Welford Roche on: 02/04/2017 08:34 AM   Modules accepted: Orders

## 2017-02-04 NOTE — Progress Notes (Signed)
BMET with K 5.7 on 8/15. Had patient come back today (8/17) for a potassium recheck and received call from lab for critical value of K 6.5. Patient currently asymptomatic without any EKG changes. Plan to admit for hyperkalemia. Kayexalate 30g given in clinic.

## 2017-02-04 NOTE — Addendum Note (Signed)
Addended by: Welford Roche on: 02/04/2017 11:46 AM   Modules accepted: Orders

## 2017-02-04 NOTE — H&P (Signed)
   Date: 02/04/2017               Patient Name:  Kayla Brooks MRN: [id number]  DOB: 04/30/1930 Age / Sex: 83 y.o., female   PCP: Vincent, Duncan Thomas, MD         Medical Service: Internal Medicine Teaching Service         Attending Physician: Dr. Klima, Lawrence, MD    First Contact: Dr. LaCroce, Sam Pager: [phone]  Second Contact: Dr. Wallace, Andrew Pager: [phone]       After Hours (After 5p/  First Contact Pager: [phone]  weekends / holidays): Second Contact Pager: [phone]   Chief Complaint: Hyperkalemia  History of Present Illness:  83 yo female with past PMHx plasmacytoma of right femur, multiple myeloma, HLD, HTN, and CKD presenting as a direct admission from Internal Medicine Clinic for hyperkalemia. She states she has been experiencing fatigue recently, but denies weakness in her upper or lower extremities. She denies cramping or pain in her extremtities. She denies changes in medications, but does state she took several BC powders the past few days. She states she has been having diarrhea for several weeks now, with no more than two loose stools per day. She had one episode of diarrhea after admission, which she contributes to the Kayexalate given to her in clinic. The patient states her appetite has been good. She denies nausea, vomiting, chest pain, shortness of breath, or changes in vision.   Meds:  No current facility-administered medications on file prior to encounter.    Current Outpatient Prescriptions on File Prior to Encounter  Medication Sig Dispense Refill  . polyethylene glycol (MIRALAX / GLYCOLAX) packet Take 17 g by mouth daily. 14 each 0  . amLODipine (NORVASC) 5 MG tablet take 1 tablet by mouth once daily 90 tablet 3  . dexamethasone (DECADRON) 4 MG tablet Take 1 tablet (4 mg total) by mouth daily. 30 tablet 1  . diclofenac sodium (VOLTAREN) 1 % GEL Apply 2 g topically 4 (four) times daily. (Patient taking differently: Apply 2 g topically 4 (four) times  daily as needed (pain). ) 100 g 0  . feeding supplement, ENSURE ENLIVE, (ENSURE ENLIVE) LIQD Take 237 mLs by mouth 2 (two) times daily between meals. 237 mL 12  . hydrALAZINE (APRESOLINE) 50 MG tablet Take 1 tablet (50 mg total) by mouth every 8 (eight) hours. 90 tablet 1  . methocarbamol (ROBAXIN) 500 MG tablet Take 1 tablet (500 mg total) by mouth 3 (three) times daily. 30 tablet 1  . nitroGLYCERIN (NITROSTAT) 0.4 MG SL tablet Place 0.4 mg under the tongue every 5 (five) minutes as needed for chest pain.    . omeprazole (PRILOSEC) 20 MG capsule take 1 capsule by mouth once daily 30 capsule 2  . oxybutynin (DITROPAN) 5 MG tablet take 1 tablet by mouth twice a day 60 tablet 1  . oxyCODONE (OXY IR/ROXICODONE) 5 MG immediate release tablet 5 mg TID, then 5 mg po q 4 prn 30 tablet 0  . pantoprazole (PROTONIX) 40 MG tablet Take 1 tablet (40 mg total) by mouth daily. 30 tablet 1  . prochlorperazine (COMPAZINE) 10 MG tablet Take 1 tablet (10 mg total) by mouth every 6 (six) hours as needed for nausea or vomiting. 30 tablet 0  . senna-docusate (SENOKOT-S) 8.6-50 MG tablet Take 1 tablet by mouth 2 (two) times daily. 30 tablet 1  . simvastatin (ZOCOR) 20 MG tablet take 1 tablet by mouth at bedtime 30   tablet 5  . sodium bicarbonate 650 MG tablet Take 1 tablet (650 mg total) by mouth 2 (two) times daily. 60 tablet 2      Allergies: Allergies as of 02/04/2017 - Review Complete 02/04/2017  Allergen Reaction Noted  . Morphine and related Other (See Comments) 04/06/2013   Past Medical History:  Diagnosis Date  . Arthritis    "right knee" (06/10/2015)  . Chronic anemia   . Chronic diarrhea    Archie Endo 06/10/2015  . GERD (gastroesophageal reflux disease)   . Hyperlipidemia   . Hypertension   . Monoclonal paraproteinemia   . Multiple myeloma without mention of remission   . Plasmacytoma of bone ([place])    right femur/notes 06/04/2015  . Pneumonia    "years ago" (06/10/2015)  . Urinary frequency      Family History:   Social History:  Social History Main Topics  . Smoking status: Former Smoker    Packs/day: 2.00    Years: 40.00    Types: Cigarettes  . Smokeless tobacco: Never Used  . Alcohol use 0.0 oz/week  . Drug use: No  . Sexual activity: No    Review of Systems: A complete ROS was negative except as per HPI.   Physical Exam: Height 5' 5" (1.651 m), weight 107 lb 9.6 oz (48.8 kg).  Physical Exam  Constitutional: She is oriented to person, place, and time.  NAD, thin, ill apprearing   HENT:  Head: Normocephalic and atraumatic.  Mouth/Throat: Oropharynx is clear and moist.  Eyes: Pupils are equal, round, and reactive to light. Conjunctivae and EOM are normal.  Neck: Normal range of motion. Neck supple.  Cardiovascular: Normal rate, regular rhythm and intact distal pulses.   Pulmonary/Chest: Effort normal and breath sounds normal.  Abdominal: Soft. Bowel sounds are normal. There is no tenderness.  Musculoskeletal: Normal range of motion. She exhibits edema (+1 pitting, ankles bilaterally).  Neurological: She is alert and oriented to person, place, and time.  Skin: Skin is warm and dry.  Psychiatric: She has a normal mood and affect.    EKG: personally reviewed my interpretation is normal sinus rhythm, left anterior fascicular block, no U waves appreciated   CXR: none to review  Assessment & Plan by Problem: Principal Problem:   Hyperkalemia Active Problems:   Multiple myeloma (HCC)   Essential hypertension   GERD (gastroesophageal reflux disease)   Normochromic normocytic anemia   Pain from bone metastases (H[facility])  Hyperkalemia Most likely secondary to CKD and effects of multiple myeloma on renal function.  Pt is asymptomatic with potassium level of 6.5. EKG showed normal sinus rhythm. Given 30 mg Kayexalate in clinic, which she tolerated well.   -BMET, CBC in AM -If potassium greater than 6 tomorrow will consider giving another dose of kayexalate    Multiple Myeloma  Progressing per recent oncology note. Currently not on treatment due to treatment intolerance in the past and the patient declined aggressive therapy. Last hospital admission she was seen by Palliative Care who started her on  Decadron 4 mg, scheduled and PRN oxycodone 5 mg, and Robaxin 500 MG daily.  -Continue comfort measures and home medications  CKD Stage III Creatinine 1.64, GFR 32. -Monitoring creatinine   Dispo: Admit patient to Observation with expected length of stay less than 2 midnights.  Signed: Melanee Spry, MD 02/04/2017, 5:08 PM  Pager: [phone]

## 2017-02-04 NOTE — Progress Notes (Signed)
Report called to Elliot Dally, RN on [place].  Patient transported via wheelchair to [facility].  Alert oriented.  Sander Nephew, RN 02/04/2017 1:29.

## 2017-02-05 DIAGNOSIS — G893 Neoplasm related pain (acute) (chronic): Secondary | ICD-10-CM

## 2017-02-05 DIAGNOSIS — I10 Essential (primary) hypertension: Secondary | ICD-10-CM

## 2017-02-05 DIAGNOSIS — C9000 Multiple myeloma not having achieved remission: Secondary | ICD-10-CM | POA: Diagnosis not present

## 2017-02-05 DIAGNOSIS — D6489 Other specified anemias: Secondary | ICD-10-CM

## 2017-02-05 DIAGNOSIS — K219 Gastro-esophageal reflux disease without esophagitis: Secondary | ICD-10-CM

## 2017-02-05 DIAGNOSIS — C7951 Secondary malignant neoplasm of bone: Secondary | ICD-10-CM | POA: Diagnosis not present

## 2017-02-05 DIAGNOSIS — Z885 Allergy status to narcotic agent status: Secondary | ICD-10-CM | POA: Diagnosis not present

## 2017-02-05 DIAGNOSIS — E875 Hyperkalemia: Secondary | ICD-10-CM

## 2017-02-05 DIAGNOSIS — N183 Chronic kidney disease, stage 3 (moderate): Secondary | ICD-10-CM | POA: Diagnosis not present

## 2017-02-05 DIAGNOSIS — Z9889 Other specified postprocedural states: Secondary | ICD-10-CM | POA: Diagnosis not present

## 2017-02-05 DIAGNOSIS — I129 Hypertensive chronic kidney disease with stage 1 through stage 4 chronic kidney disease, or unspecified chronic kidney disease: Secondary | ICD-10-CM | POA: Diagnosis not present

## 2017-02-05 DIAGNOSIS — Z66 Do not resuscitate: Secondary | ICD-10-CM | POA: Diagnosis not present

## 2017-02-05 DIAGNOSIS — Z79899 Other long term (current) drug therapy: Secondary | ICD-10-CM | POA: Diagnosis not present

## 2017-02-05 DIAGNOSIS — D630 Anemia in neoplastic disease: Secondary | ICD-10-CM | POA: Diagnosis not present

## 2017-02-05 LAB — CBC
HEMATOCRIT: 23.6 % — AB (ref 36.0–46.0)
HEMOGLOBIN: 7.5 g/dL — AB (ref 12.0–15.0)
MCH: 31.5 pg (ref 26.0–34.0)
MCHC: 31.8 g/dL (ref 30.0–36.0)
MCV: 99.2 fL (ref 78.0–100.0)
Platelets: 138 10*3/uL — ABNORMAL LOW (ref 150–400)
RBC: 2.38 MIL/uL — AB (ref 3.87–5.11)
RDW: 16.5 % — ABNORMAL HIGH (ref 11.5–15.5)
WBC: 5.3 10*3/uL (ref 4.0–10.5)

## 2017-02-05 LAB — BASIC METABOLIC PANEL WITH GFR
Anion gap: 9 (ref 5–15)
BUN: 50 mg/dL — ABNORMAL HIGH (ref 6–20)
CO2: 22 mmol/L (ref 22–32)
Calcium: 8.1 mg/dL — ABNORMAL LOW (ref 8.9–10.3)
Chloride: 110 mmol/L (ref 101–111)
Creatinine, Ser: 1.29 mg/dL — ABNORMAL HIGH (ref 0.44–1.00)
GFR calc Af Amer: 43 mL/min — ABNORMAL LOW
GFR calc non Af Amer: 37 mL/min — ABNORMAL LOW
Glucose, Bld: 95 mg/dL (ref 65–99)
Potassium: 4.7 mmol/L (ref 3.5–5.1)
Sodium: 141 mmol/L (ref 135–145)

## 2017-02-05 LAB — PREPARE RBC (CROSSMATCH)

## 2017-02-05 MED ORDER — PROCHLORPERAZINE MALEATE 10 MG PO TABS: 10.0000 mg | ORAL_TABLET | Freq: Four times a day (QID) | ORAL | 0 refills | Status: AC | PRN

## 2017-02-05 MED ORDER — SODIUM CHLORIDE 0.9 % IV SOLN
Freq: Once | INTRAVENOUS | Status: AC
  Administered 2017-02-05: 12:00:00 via INTRAVENOUS

## 2017-02-05 MED ORDER — SIMVASTATIN 20 MG PO TABS
20.0000 mg | ORAL_TABLET | Freq: Every day | ORAL | 0 refills | Status: DC
Start: 2017-02-05 — End: 2017-03-28

## 2017-02-05 MED ORDER — SODIUM CHLORIDE 0.9 % IV SOLN
Freq: Once | INTRAVENOUS | Status: AC
  Administered 2017-02-05: 14:00:00 via INTRAVENOUS

## 2017-02-05 MED ORDER — AMLODIPINE BESYLATE 5 MG PO TABS: 5.0000 mg | ORAL_TABLET | Freq: Every day | ORAL | 0 refills | Status: DC

## 2017-02-05 NOTE — Progress Notes (Signed)
Pt is getting 1st unit of blood transfusion now, plan is to discharge the patient after this blood done, double checked with MD regarding post CBC, according to MD, she does not need post CBC and she can be discharged. Will call patient's son after blood transfusion ended for ride

## 2017-02-05 NOTE — Assessment & Plan Note (Signed)
BMET with K 5.7 on 8/15. Had patient come back today (8/17) for a potassium recheck and received call from lab for critical value of K 6.5. Patient currently asymptomatic without any EKG changes. Plan to admit for hyperkalemia. Kayexalate 30g given in clinic.

## 2017-02-05 NOTE — Progress Notes (Signed)
Purple DNR band provided to the patient

## 2017-02-05 NOTE — Progress Notes (Signed)
Internal Medicine Attending  Date: 02/05/2017  Patient name: Kayla Brooks Medical record number: [id number] Date of birth: 1930-04-04 Age: 83 y.o. Gender: female  I saw and evaluated the patient. I reviewed the resident's note by Dr. Aggie Hacker and I agree with the resident's findings and plans as documented in her progress note.  When seen on rounds this morning [name] was without acute complaints. Her hyperkalemia resolved with therapy. She is interested in getting a blood transfusion today in hopes of keeping her out of the hospital longer in case she is developing transfusion dependence. After the transfusion she will be discharged home.

## 2017-02-05 NOTE — Discharge Summary (Signed)
Name: Kayla Brooks MRN: [id number] DOB: 10-09-1929 83 y.o. PCP: Axel Filler, MD  Date of Admission: 02/04/2017  1:54 PM Date of Discharge: 02/05/2017 Attending Physician: Oval Linsey, MD  Discharge Diagnosis: 1. Hyperkalemia, resolved Principal Problem:   Hyperkalemia Active Problems:   Multiple myeloma (HCC)   Essential hypertension   GERD (gastroesophageal reflux disease)   Normochromic normocytic anemia   Pain from bone metastases [facility])   Discharge Medications: Allergies as of 02/05/2017      Reactions   Morphine And Related Other (See Comments)   Abdominal Pain      Medication List    TAKE these medications   amLODipine 5 MG tablet Commonly known as:  NORVASC Take 1 tablet (5 mg total) by mouth daily. What changed:  See the new instructions.   dexamethasone 4 MG tablet Commonly known as:  DECADRON Take 1 tablet (4 mg total) by mouth daily.   feeding supplement (ENSURE ENLIVE) Liqd Take 237 mLs by mouth 2 (two) times daily between meals.   hydrALAZINE 50 MG tablet Commonly known as:  APRESOLINE Take 1 tablet (50 mg total) by mouth every 8 (eight) hours.   HYDROcodone-acetaminophen 5-325 MG tablet Commonly known as:  NORCO/VICODIN Take 1 tablet by mouth every 6 (six) hours as needed for moderate pain.   lisinopril 40 MG tablet Commonly known as:  PRINIVIL,ZESTRIL Take 40 mg by mouth daily.   nitroGLYCERIN 0.4 MG SL tablet Commonly known as:  NITROSTAT Place 0.4 mg under the tongue every 5 (five) minutes as needed for chest pain.   oxyCODONE 5 MG immediate release tablet Commonly known as:  Oxy IR/ROXICODONE 5 mg TID, then 5 mg po q 4 prn   pantoprazole 40 MG tablet Commonly known as:  PROTONIX Take 1 tablet (40 mg total) by mouth daily.   polyethylene glycol packet Commonly known as:  MIRALAX / GLYCOLAX Take 17 g by mouth daily.   prochlorperazine 10 MG tablet Commonly known as:  COMPAZINE Take 1 tablet (10 mg total) by mouth  every 6 (six) hours as needed for nausea or vomiting.   senna-docusate 8.6-50 MG tablet Commonly known as:  Senokot-S Take 1 tablet by mouth 2 (two) times daily.   simvastatin 20 MG tablet Commonly known as:  ZOCOR Take 1 tablet (20 mg total) by mouth at bedtime. What changed:  See the new instructions.   sodium bicarbonate 650 MG tablet Take 1 tablet (650 mg total) by mouth 2 (two) times daily.       Disposition and follow-up:   Kayla Brooks was discharged from [facility] in Stable condition.  At the hospital follow up visit please address:  1.  Symptomatic hyperkalemia, symptomatic anemia   2.  Labs / imaging needed at time of follow-up: BMET, CBC  3.  Pending labs/ test needing follow-up: None  Follow-up Appointments:   Hospital Course by problem list: Principal Problem:   Hyperkalemia Active Problems:   Multiple myeloma (HCC)   Essential hypertension   GERD (gastroesophageal reflux disease)   Normochromic normocytic anemia   Pain from bone metastases (HCC)   1. Hyperkalemia Kayla Brooks was admitted to [facility] and Internal Medicine Teaching Service for hyperkalemia. She presented to [facility] for follow up of elevated potassium and was found to have a serum potassium level of 6.5. EKG showed normal sinus rhythm, and she was asymptomatic. She was given Kayexalate 30 mg in clinic and tolerated it well. Repeat potassium level the  morning after admission had improved to 4.7. She remained asymptomatic through out the duration of hospitalization.   2. Symptomatic Anemia Transfused one unit of PRBCs prior to discharge for palliative benefit.    Discharge Vitals:   BP (!) 116/55 (BP Location: Right Arm)   Pulse 73   Temp 98.4 F (36.9 C) (Oral)   Resp 18   Ht 5' 5" (1.651 m)   Wt 107 lb 1.6 oz (48.6 kg)   SpO2 100%   BMI 17.82 kg/m   Pertinent Labs, Studies, and Procedures:  BMP Latest Ref Rng & Units 02/05/2017 02/04/2017 02/02/2017    Glucose 65 - 99 mg/dL 95 111(H) 94  BUN 6 - 20 mg/dL 50(H) 58(H) 46(H)  Creatinine 0.44 - 1.00 mg/dL 1.29(H) 1.64(H) 1.44(H)  BUN/Creat Ratio 12 - 28 - - 32(H)  Sodium 135 - 145 mmol/L 141 137 141  Potassium 3.5 - 5.1 mmol/L 4.7 6.5(HH) 5.7(H)  Chloride 101 - 111 mmol/L 110 107 105  CO2 22 - 32 mmol/L 22 24 21  Calcium 8.9 - 10.3 mg/dL 8.1(L) 8.8(L) 8.6(L)   CBC Latest Ref Rng & Units 02/05/2017 02/02/2017 01/26/2017  WBC 4.0 - 10.5 K/uL 5.3 5.8 6.1  Hemoglobin 12.0 - 15.0 g/dL 7.5(L) 8.1(L) 6.6(LL)  Hematocrit 36.0 - 46.0 % 23.6(L) 24.8(L) 20.8(L)  Platelets 150 - 400 K/uL 138(L) 186 103(L)    Discharge Instructions: Discharge Instructions    Call MD for:  severe uncontrolled pain    Complete by:  As directed    Call MD for:  temperature >100.4    Complete by:  As directed    Diet - low sodium heart healthy    Complete by:  As directed    Discharge instructions    Complete by:  As directed    Patient can be discharged after completion of blood transfusion.   Kayla Brooks,   You were admitted for elevated potassium levels and you were given a medication to help decrease your potassium. The potassium level returned to normal today. You also received one unit of blood for your anemia.  Please follow up with you primary care physician in one two weeks after discharge.   Increase activity slowly    Complete by:  As directed       Signed: Lacroce, Samantha J, MD 02/05/2017, 1:30 PM   Pager: [phone] 

## 2017-02-05 NOTE — Discharge Summary (Signed)
Pt got discharged to home after 1 unit blood transfusion, discharge instructions provided and patient showed understanding to it, IV taken out,Telemonitor DC,pt left unit in wheelchair with all of the belongings accompanied with her Son

## 2017-02-05 NOTE — Progress Notes (Signed)
BMET with K 5.7 on 8/15. Had patient come back today (8/17) for a potassium recheck and received call from lab for critical value of K 6.5. Patient currently asymptomatic without any EKG changes. Plan to admit for hyperkalemia. Kayexalate 30g given in clinic.

## 2017-02-05 NOTE — Progress Notes (Signed)
   Subjective: Patient seen and examines. She denies pain, weakness, shortness of breath, chest pain, or lightheadedness. It was discussed with the patient that her hemoglobin is low and she gave consent for transfusion prior to discharge. She has no other complaints at this time and is amenable to discharge later today after her blood transfusion.   Objective:  Vital signs in last 24 hours: Vitals:   02/05/17 0116 02/05/17 0402 02/05/17 0740 02/05/17 1143  BP: (!) 123/55 (!) 142/62 (!) 114/48 (!) 116/55  Pulse: 65 60 62 73  Resp: '20 18 18 18  '$ Temp: 99.3 F (37.4 C) 99 F (37.2 C) 98.6 F (37 C) 98.4 F (36.9 C)  TempSrc: Oral Oral Oral Oral  SpO2: 99% 100% 99% 100%  Weight:  107 lb 1.6 oz (48.6 kg)    Height:       General: Laying in bed comfortably, NAD, chronically ill, thin  HEENT: [place]/AT, EOMI, no scleral icterus, PERRL Cardiac: RRR, No R/M/G appreciated Pulm: normal effort, CTAB Abd: soft, non tender, non distended, BS normal Ext: extremities well perfused, +1 peripheral edema ankles bilateralyl Neuro: alert and oriented X3, cranial nerves II-XII grossly intact   Assessment/Plan:  Principal Problem:   Hyperkalemia Active Problems:   Multiple myeloma (HCC)   Essential hypertension   GERD (gastroesophageal reflux disease)   Normochromic normocytic anemia   Pain from bone metastases (HCC)  Hyperkalemia Resolved since admission. Likely due to acute on chronic renal failure which is either related to NSAIDs or protein nephropathy from the multiple myeloma. K+ level today 4.7 from 6.5.  -Hold further treatment due to response to 1 dose of Kayexelate 30 mg . Normocytic anemia Secondary to progressing multiple myeloma and anemia of chronic disease. Hgb 7.5 today down from 8.1 on 02/02/2017. Pt interested in palliative blood transfusion prior to discharge.  -Type and screen -Transfuse 1 unit PRBCs    Dispo: Anticipated discharge today.   Melanee Spry,  MD 02/05/2017, 12:53 PM Pager: [phone]

## 2017-02-06 LAB — TYPE AND SCREEN
ABO/RH(D): O POS
ANTIBODY SCREEN: NEGATIVE
UNIT DIVISION: 0

## 2017-02-06 LAB — BPAM RBC
Blood Product Expiration Date: 201809192359
ISSUE DATE / TIME: 201808181424
Unit Type and Rh: 5100

## 2017-02-08 ENCOUNTER — Telehealth: Payer: Self-pay

## 2017-02-08 NOTE — Telephone Encounter (Signed)
Megan with [facility] want to informed the doctor pt had a missed visit on 02/04/2017.

## 2017-02-09 NOTE — Telephone Encounter (Signed)
[facility] given VO to resume skilled nsg and PT, OT and hh CSW in home, do you agree?

## 2017-02-11 ENCOUNTER — Telehealth: Payer: Self-pay

## 2017-02-11 DIAGNOSIS — G893 Neoplasm related pain (acute) (chronic): Secondary | ICD-10-CM | POA: Diagnosis not present

## 2017-02-11 DIAGNOSIS — C7951 Secondary malignant neoplasm of bone: Secondary | ICD-10-CM | POA: Diagnosis not present

## 2017-02-11 DIAGNOSIS — D649 Anemia, unspecified: Secondary | ICD-10-CM | POA: Diagnosis not present

## 2017-02-11 DIAGNOSIS — Z79891 Long term (current) use of opiate analgesic: Secondary | ICD-10-CM | POA: Diagnosis not present

## 2017-02-11 DIAGNOSIS — I700 Atherosclerosis of aorta: Secondary | ICD-10-CM | POA: Diagnosis not present

## 2017-02-11 DIAGNOSIS — C9000 Multiple myeloma not having achieved remission: Secondary | ICD-10-CM | POA: Diagnosis not present

## 2017-02-11 DIAGNOSIS — Z96641 Presence of right artificial hip joint: Secondary | ICD-10-CM | POA: Diagnosis not present

## 2017-02-11 DIAGNOSIS — I129 Hypertensive chronic kidney disease with stage 1 through stage 4 chronic kidney disease, or unspecified chronic kidney disease: Secondary | ICD-10-CM | POA: Diagnosis not present

## 2017-02-11 DIAGNOSIS — E46 Unspecified protein-calorie malnutrition: Secondary | ICD-10-CM | POA: Diagnosis not present

## 2017-02-11 DIAGNOSIS — Z9181 History of falling: Secondary | ICD-10-CM | POA: Diagnosis not present

## 2017-02-11 DIAGNOSIS — N183 Chronic kidney disease, stage 3 (moderate): Secondary | ICD-10-CM | POA: Diagnosis not present

## 2017-02-11 NOTE — Telephone Encounter (Signed)
Kayla Brooks with [facility] requesting PT VO. Please call back.

## 2017-02-11 NOTE — Telephone Encounter (Signed)
Yes, thank you.

## 2017-02-11 NOTE — Telephone Encounter (Signed)
VO given to begin Home health PT. Do you agree? Thanks!

## 2017-02-11 NOTE — Telephone Encounter (Signed)
Kayla Brooks with [facility] requesting VO. Please call back.

## 2017-02-11 NOTE — Telephone Encounter (Deleted)
Dialed the number but no answer.

## 2017-02-14 NOTE — Telephone Encounter (Signed)
Rtc, lm for rtc 

## 2017-02-15 ENCOUNTER — Encounter: Payer: Self-pay | Admitting: Student in an Organized Health Care Education/Training Program

## 2017-02-15 ENCOUNTER — Ambulatory Visit: Payer: Self-pay

## 2017-02-15 NOTE — Telephone Encounter (Signed)
Yes.  Thank you.

## 2017-02-16 DIAGNOSIS — I129 Hypertensive chronic kidney disease with stage 1 through stage 4 chronic kidney disease, or unspecified chronic kidney disease: Secondary | ICD-10-CM | POA: Diagnosis not present

## 2017-02-16 DIAGNOSIS — D649 Anemia, unspecified: Secondary | ICD-10-CM | POA: Diagnosis not present

## 2017-02-16 DIAGNOSIS — Z96641 Presence of right artificial hip joint: Secondary | ICD-10-CM | POA: Diagnosis not present

## 2017-02-16 DIAGNOSIS — G893 Neoplasm related pain (acute) (chronic): Secondary | ICD-10-CM | POA: Diagnosis not present

## 2017-02-16 DIAGNOSIS — N183 Chronic kidney disease, stage 3 (moderate): Secondary | ICD-10-CM | POA: Diagnosis not present

## 2017-02-16 DIAGNOSIS — C9000 Multiple myeloma not having achieved remission: Secondary | ICD-10-CM | POA: Diagnosis not present

## 2017-02-16 DIAGNOSIS — I700 Atherosclerosis of aorta: Secondary | ICD-10-CM | POA: Diagnosis not present

## 2017-02-16 DIAGNOSIS — C7951 Secondary malignant neoplasm of bone: Secondary | ICD-10-CM | POA: Diagnosis not present

## 2017-02-16 DIAGNOSIS — Z79891 Long term (current) use of opiate analgesic: Secondary | ICD-10-CM | POA: Diagnosis not present

## 2017-02-16 DIAGNOSIS — Z9181 History of falling: Secondary | ICD-10-CM | POA: Diagnosis not present

## 2017-02-16 DIAGNOSIS — E46 Unspecified protein-calorie malnutrition: Secondary | ICD-10-CM | POA: Diagnosis not present

## 2017-02-23 ENCOUNTER — Telehealth: Payer: Self-pay | Admitting: *Deleted

## 2017-02-23 ENCOUNTER — Telehealth: Payer: Self-pay

## 2017-02-23 DIAGNOSIS — Z9181 History of falling: Secondary | ICD-10-CM | POA: Diagnosis not present

## 2017-02-23 DIAGNOSIS — G893 Neoplasm related pain (acute) (chronic): Secondary | ICD-10-CM | POA: Diagnosis not present

## 2017-02-23 DIAGNOSIS — I700 Atherosclerosis of aorta: Secondary | ICD-10-CM | POA: Diagnosis not present

## 2017-02-23 DIAGNOSIS — C9000 Multiple myeloma not having achieved remission: Secondary | ICD-10-CM | POA: Diagnosis not present

## 2017-02-23 DIAGNOSIS — D649 Anemia, unspecified: Secondary | ICD-10-CM | POA: Diagnosis not present

## 2017-02-23 DIAGNOSIS — Z96641 Presence of right artificial hip joint: Secondary | ICD-10-CM | POA: Diagnosis not present

## 2017-02-23 DIAGNOSIS — N183 Chronic kidney disease, stage 3 (moderate): Secondary | ICD-10-CM | POA: Diagnosis not present

## 2017-02-23 DIAGNOSIS — Z79891 Long term (current) use of opiate analgesic: Secondary | ICD-10-CM | POA: Diagnosis not present

## 2017-02-23 DIAGNOSIS — E46 Unspecified protein-calorie malnutrition: Secondary | ICD-10-CM | POA: Diagnosis not present

## 2017-02-23 DIAGNOSIS — C7951 Secondary malignant neoplasm of bone: Secondary | ICD-10-CM | POA: Diagnosis not present

## 2017-02-23 DIAGNOSIS — I129 Hypertensive chronic kidney disease with stage 1 through stage 4 chronic kidney disease, or unspecified chronic kidney disease: Secondary | ICD-10-CM | POA: Diagnosis not present

## 2017-02-23 NOTE — Telephone Encounter (Signed)
[facility] calls to obtain proper medlist, went over meds and referred to dr Allyson Sabal

## 2017-02-23 NOTE — Telephone Encounter (Signed)
Langley Gauss RN with [facility] called to confirm medication list. the pt has recently lost 2 brothers d/t cancer. Son passed away 4 weeks ago from colon cancer. She is feeling a little bit lost. Her compliance on her medication was questioned by Langley Gauss.  Lisinopril dc'd on Aug 1st - removed from [facility] Hydrocodone is not seen at the house - removed from [facility] protonix not seen in the house - left on MAR.

## 2017-02-24 ENCOUNTER — Other Ambulatory Visit: Payer: Self-pay | Admitting: Internal Medicine

## 2017-02-28 DIAGNOSIS — N183 Chronic kidney disease, stage 3 (moderate): Secondary | ICD-10-CM | POA: Diagnosis not present

## 2017-02-28 DIAGNOSIS — I129 Hypertensive chronic kidney disease with stage 1 through stage 4 chronic kidney disease, or unspecified chronic kidney disease: Secondary | ICD-10-CM | POA: Diagnosis not present

## 2017-02-28 DIAGNOSIS — D649 Anemia, unspecified: Secondary | ICD-10-CM | POA: Diagnosis not present

## 2017-02-28 DIAGNOSIS — G893 Neoplasm related pain (acute) (chronic): Secondary | ICD-10-CM | POA: Diagnosis not present

## 2017-02-28 DIAGNOSIS — Z9181 History of falling: Secondary | ICD-10-CM | POA: Diagnosis not present

## 2017-02-28 DIAGNOSIS — E46 Unspecified protein-calorie malnutrition: Secondary | ICD-10-CM | POA: Diagnosis not present

## 2017-02-28 DIAGNOSIS — Z96641 Presence of right artificial hip joint: Secondary | ICD-10-CM | POA: Diagnosis not present

## 2017-02-28 DIAGNOSIS — C9000 Multiple myeloma not having achieved remission: Secondary | ICD-10-CM | POA: Diagnosis not present

## 2017-02-28 DIAGNOSIS — C7951 Secondary malignant neoplasm of bone: Secondary | ICD-10-CM | POA: Diagnosis not present

## 2017-02-28 DIAGNOSIS — Z79891 Long term (current) use of opiate analgesic: Secondary | ICD-10-CM | POA: Diagnosis not present

## 2017-02-28 DIAGNOSIS — I700 Atherosclerosis of aorta: Secondary | ICD-10-CM | POA: Diagnosis not present

## 2017-03-02 DIAGNOSIS — D649 Anemia, unspecified: Secondary | ICD-10-CM | POA: Diagnosis not present

## 2017-03-02 DIAGNOSIS — E46 Unspecified protein-calorie malnutrition: Secondary | ICD-10-CM | POA: Diagnosis not present

## 2017-03-02 DIAGNOSIS — Z9181 History of falling: Secondary | ICD-10-CM | POA: Diagnosis not present

## 2017-03-02 DIAGNOSIS — Z79891 Long term (current) use of opiate analgesic: Secondary | ICD-10-CM | POA: Diagnosis not present

## 2017-03-02 DIAGNOSIS — I129 Hypertensive chronic kidney disease with stage 1 through stage 4 chronic kidney disease, or unspecified chronic kidney disease: Secondary | ICD-10-CM | POA: Diagnosis not present

## 2017-03-02 DIAGNOSIS — G893 Neoplasm related pain (acute) (chronic): Secondary | ICD-10-CM | POA: Diagnosis not present

## 2017-03-02 DIAGNOSIS — C7951 Secondary malignant neoplasm of bone: Secondary | ICD-10-CM | POA: Diagnosis not present

## 2017-03-02 DIAGNOSIS — N183 Chronic kidney disease, stage 3 (moderate): Secondary | ICD-10-CM | POA: Diagnosis not present

## 2017-03-02 DIAGNOSIS — I700 Atherosclerosis of aorta: Secondary | ICD-10-CM | POA: Diagnosis not present

## 2017-03-02 DIAGNOSIS — Z96641 Presence of right artificial hip joint: Secondary | ICD-10-CM | POA: Diagnosis not present

## 2017-03-02 DIAGNOSIS — C9000 Multiple myeloma not having achieved remission: Secondary | ICD-10-CM | POA: Diagnosis not present

## 2017-03-04 DIAGNOSIS — D649 Anemia, unspecified: Secondary | ICD-10-CM | POA: Diagnosis not present

## 2017-03-04 DIAGNOSIS — C9000 Multiple myeloma not having achieved remission: Secondary | ICD-10-CM | POA: Diagnosis not present

## 2017-03-04 DIAGNOSIS — Z79891 Long term (current) use of opiate analgesic: Secondary | ICD-10-CM | POA: Diagnosis not present

## 2017-03-04 DIAGNOSIS — N183 Chronic kidney disease, stage 3 (moderate): Secondary | ICD-10-CM | POA: Diagnosis not present

## 2017-03-04 DIAGNOSIS — Z9181 History of falling: Secondary | ICD-10-CM | POA: Diagnosis not present

## 2017-03-04 DIAGNOSIS — E46 Unspecified protein-calorie malnutrition: Secondary | ICD-10-CM | POA: Diagnosis not present

## 2017-03-04 DIAGNOSIS — C7951 Secondary malignant neoplasm of bone: Secondary | ICD-10-CM | POA: Diagnosis not present

## 2017-03-04 DIAGNOSIS — Z96641 Presence of right artificial hip joint: Secondary | ICD-10-CM | POA: Diagnosis not present

## 2017-03-04 DIAGNOSIS — I700 Atherosclerosis of aorta: Secondary | ICD-10-CM | POA: Diagnosis not present

## 2017-03-04 DIAGNOSIS — I129 Hypertensive chronic kidney disease with stage 1 through stage 4 chronic kidney disease, or unspecified chronic kidney disease: Secondary | ICD-10-CM | POA: Diagnosis not present

## 2017-03-04 DIAGNOSIS — G893 Neoplasm related pain (acute) (chronic): Secondary | ICD-10-CM | POA: Diagnosis not present

## 2017-03-09 DIAGNOSIS — Z96641 Presence of right artificial hip joint: Secondary | ICD-10-CM | POA: Diagnosis not present

## 2017-03-09 DIAGNOSIS — Z9181 History of falling: Secondary | ICD-10-CM | POA: Diagnosis not present

## 2017-03-09 DIAGNOSIS — Z79891 Long term (current) use of opiate analgesic: Secondary | ICD-10-CM | POA: Diagnosis not present

## 2017-03-09 DIAGNOSIS — N183 Chronic kidney disease, stage 3 (moderate): Secondary | ICD-10-CM | POA: Diagnosis not present

## 2017-03-09 DIAGNOSIS — E46 Unspecified protein-calorie malnutrition: Secondary | ICD-10-CM | POA: Diagnosis not present

## 2017-03-09 DIAGNOSIS — G893 Neoplasm related pain (acute) (chronic): Secondary | ICD-10-CM | POA: Diagnosis not present

## 2017-03-09 DIAGNOSIS — C9000 Multiple myeloma not having achieved remission: Secondary | ICD-10-CM | POA: Diagnosis not present

## 2017-03-09 DIAGNOSIS — D649 Anemia, unspecified: Secondary | ICD-10-CM | POA: Diagnosis not present

## 2017-03-09 DIAGNOSIS — C7951 Secondary malignant neoplasm of bone: Secondary | ICD-10-CM | POA: Diagnosis not present

## 2017-03-09 DIAGNOSIS — I129 Hypertensive chronic kidney disease with stage 1 through stage 4 chronic kidney disease, or unspecified chronic kidney disease: Secondary | ICD-10-CM | POA: Diagnosis not present

## 2017-03-09 DIAGNOSIS — I700 Atherosclerosis of aorta: Secondary | ICD-10-CM | POA: Diagnosis not present

## 2017-03-11 ENCOUNTER — Telehealth: Payer: Self-pay

## 2017-03-11 NOTE — Telephone Encounter (Signed)
Liji with [facility] want to informed the office of missed visit. Also requesting VO, please call pt back.

## 2017-03-11 NOTE — Telephone Encounter (Signed)
Lm for rtc 

## 2017-03-15 NOTE — Telephone Encounter (Signed)
Lm for rtc.Kayla Brooks, Kayla Greenspan Cassady9/25/201810:22 AM

## 2017-03-15 NOTE — Telephone Encounter (Signed)
Pt missed her visit with home health last week and is scheduled to have one more visit this week before being discharged from home health.  Since pt missed her visit last week, home health can "make up" that day by adding one additional visit this week-if pt will allow.   Either way pt will have completed all home health visits this week.  Verbal authorization given to add additional day to make up for missed visit.  No further action is needed. Phone call complete.Kayla Hidden Cassady9/25/201810:38 AM

## 2017-03-16 ENCOUNTER — Telehealth: Payer: Self-pay

## 2017-03-16 NOTE — Telephone Encounter (Signed)
[facility] form faxed 03/16/2017.

## 2017-03-17 DIAGNOSIS — N183 Chronic kidney disease, stage 3 (moderate): Secondary | ICD-10-CM | POA: Diagnosis not present

## 2017-03-17 DIAGNOSIS — I700 Atherosclerosis of aorta: Secondary | ICD-10-CM | POA: Diagnosis not present

## 2017-03-17 DIAGNOSIS — D649 Anemia, unspecified: Secondary | ICD-10-CM | POA: Diagnosis not present

## 2017-03-17 DIAGNOSIS — G893 Neoplasm related pain (acute) (chronic): Secondary | ICD-10-CM | POA: Diagnosis not present

## 2017-03-17 DIAGNOSIS — I129 Hypertensive chronic kidney disease with stage 1 through stage 4 chronic kidney disease, or unspecified chronic kidney disease: Secondary | ICD-10-CM | POA: Diagnosis not present

## 2017-03-17 DIAGNOSIS — C9000 Multiple myeloma not having achieved remission: Secondary | ICD-10-CM | POA: Diagnosis not present

## 2017-03-17 DIAGNOSIS — E46 Unspecified protein-calorie malnutrition: Secondary | ICD-10-CM | POA: Diagnosis not present

## 2017-03-17 DIAGNOSIS — C7951 Secondary malignant neoplasm of bone: Secondary | ICD-10-CM | POA: Diagnosis not present

## 2017-03-17 DIAGNOSIS — Z79891 Long term (current) use of opiate analgesic: Secondary | ICD-10-CM | POA: Diagnosis not present

## 2017-03-17 DIAGNOSIS — Z96641 Presence of right artificial hip joint: Secondary | ICD-10-CM | POA: Diagnosis not present

## 2017-03-17 DIAGNOSIS — Z9181 History of falling: Secondary | ICD-10-CM | POA: Diagnosis not present

## 2017-03-28 ENCOUNTER — Ambulatory Visit (INDEPENDENT_AMBULATORY_CARE_PROVIDER_SITE_OTHER): Payer: Medicare Other | Admitting: Student in an Organized Health Care Education/Training Program

## 2017-03-28 ENCOUNTER — Encounter (INDEPENDENT_AMBULATORY_CARE_PROVIDER_SITE_OTHER): Payer: Self-pay

## 2017-03-28 VITALS — BP 182/62 | HR 75 | Temp 97.4°F | Ht 65.0 in | Wt 122.9 lb

## 2017-03-28 DIAGNOSIS — N170 Acute kidney failure with tubular necrosis: Secondary | ICD-10-CM | POA: Diagnosis not present

## 2017-03-28 DIAGNOSIS — N183 Chronic kidney disease, stage 3 unspecified: Secondary | ICD-10-CM

## 2017-03-28 DIAGNOSIS — C9000 Multiple myeloma not having achieved remission: Secondary | ICD-10-CM

## 2017-03-28 DIAGNOSIS — I129 Hypertensive chronic kidney disease with stage 1 through stage 4 chronic kidney disease, or unspecified chronic kidney disease: Secondary | ICD-10-CM | POA: Diagnosis not present

## 2017-03-28 LAB — BASIC METABOLIC PANEL
Anion gap: 10 (ref 5–15)
BUN: 47 mg/dL — ABNORMAL HIGH (ref 6–20)
CO2: 21 mmol/L — ABNORMAL LOW (ref 22–32)
Calcium: 8.8 mg/dL — ABNORMAL LOW (ref 8.9–10.3)
Chloride: 106 mmol/L (ref 101–111)
Creatinine, Ser: 1.2 mg/dL — ABNORMAL HIGH (ref 0.44–1.00)
GFR, EST AFRICAN AMERICAN: 46 mL/min — AB (ref >60)
GFR, EST NON AFRICAN AMERICAN: 40 mL/min — AB (ref >60)
GLUCOSE: 116 mg/dL — AB (ref 65–99)
POTASSIUM: 5 mmol/L (ref 3.5–5.1)
Sodium: 137 mmol/L (ref 135–145)

## 2017-03-28 LAB — CBC
HEMATOCRIT: 29.3 % — AB (ref 36.0–46.0)
HEMOGLOBIN: 10 g/dL — AB (ref 12.0–15.0)
MCH: 34.2 pg — ABNORMAL HIGH (ref 26.0–34.0)
MCHC: 34.1 g/dL (ref 30.0–36.0)
MCV: 100.3 fL — ABNORMAL HIGH (ref 78.0–100.0)
Platelets: 181 10*3/uL (ref 150–400)
RBC: 2.92 MIL/uL — AB (ref 3.87–5.11)
RDW: 15.2 % (ref 11.5–15.5)
WBC: 9.1 10*3/uL (ref 4.0–10.5)

## 2017-03-28 MED ORDER — OXYCODONE HCL 5 MG PO TABS: 5.0000 mg | ORAL_TABLET | Freq: Every day | ORAL | 0 refills | Status: DC | PRN

## 2017-03-28 MED ORDER — FUROSEMIDE 80 MG PO TABS: 80.0000 mg | ORAL_TABLET | Freq: Every day | ORAL | 2 refills | Status: AC

## 2017-03-28 MED ORDER — NITROGLYCERIN 0.4 MG SL SUBL: 0.4000 mg | SUBLINGUAL_TABLET | SUBLINGUAL | 1 refills | Status: AC | PRN

## 2017-03-28 NOTE — Assessment & Plan Note (Signed)
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 She is volume overloaded today with tight lower extremities pitting edema and increasing dyspnea on exertion. We drew labs which shows stable creatinine with the BUN of 47, GFR 46. Plan is to start diuresis for the volume overload, will start with Lasix 80mg  once daily for now given degree of renal dysfunction. Follow up in one month for BMP recheck. Hopefully this will also control her hyperkalemia.

## 2017-03-28 NOTE — Progress Notes (Signed)
   Assessment and Plan:  See Encounters tab for problem-based medical decision making.   __________________________________________________________  HPI:   83 year old woman who has multiple myeloma here for follow-up of acute kidney injury. The patient's multiple myeloma has been progressing since about July when she was admitted with acute renal failure and bone pain. She saw her oncologist after that admission and was told that there is no further treatments available, and is recommended that palliative care was the only remaining option. She was admitted again in August because of hyperkalemia which was managed medically. She has been home for about one month at this point and is doing fairly well. She is independent in all her activities of daily living. She is complaining of increasing swelling in both of her legs, says that the tightness of her skin is painful. Does not want to wear compression stockings. Also feels some shortness of breath with minimal activities at home. Occasionally gets a sensation of chest pressure which quickly goes away when she takes a sublingual nitroglycerin. She was using oxycodone sparingly, 1 tablet every few days with good benefit. Reports regular bowel movements.   She is accompanied by her son and grand-niece today. The family is aware of her prognosis. At last hospitalization we had arranged for home hospice to start visiting her. However she did not like having strangers come and visit her in the house and she asked him to stop coming. She tells me that she is very private person and prefers to be on her own. Her family checks in and calls her very frequently, it seems like she wants more privacy than they are allowing her. She does want to stay at home for as long as possible.  __________________________________________________________  Problem List: Patient Active Problem List   Diagnosis Date Noted  . Pain from bone metastases (HCC)     Priority: High    . CKD (chronic kidney disease) stage 3, GFR 30-59 ml/min (HCC) 01/12/2017    Priority: High  . Multiple myeloma ([place]) 03/27/2009    Priority: High  . Normochromic normocytic anemia 01/12/2017    Priority: Medium  . Urge incontinence 12/11/2014    Priority: Medium  . Essential hypertension 05/04/2006    Priority: Medium  . GERD (gastroesophageal reflux disease) 05/04/2006    Priority: Medium    Medications: Reconciled today in Epic __________________________________________________________  Physical Exam:  Vital Signs: Vitals:   03/28/17 1234  BP: (!) 182/62  Pulse: 75  Temp: (!) 97.4 F (36.3 C)  TempSrc: Oral  SpO2: 100%  Weight: 122 lb 14.4 oz (55.7 kg)  Height: _0  (1.651 m)    Gen: Appears weak, NAD, sitting in a wheelchair Abd: Soft, NT, ND CV: no JVD Ext: Warm, large tense edema in both legs to the knees, equal bilaterally Skin: No atypical appearing moles. No rashes Psych: depressed appearing, smiles at her family though  Benancio Deeds (patient's grand-niece):  [phone]

## 2017-03-28 NOTE — Assessment & Plan Note (Addendum)
Currently end stage disease, progressive light chain myeloma with no further treatment options. She is managed by Dr. Dorita Fray at the cancer center, but now on a palliative approach to her care. We talked today about what to expect from here, prognosis is likely weeks to months. She is a very private person, and does not want help at home, but has a very active family in [place] that is willing to help. She asked the home hospice to stop coming a few weeks ago. After our talk, she sees that they have value in helping with symptom management especially as her disease progresses. She is willing to work with home hospice again, but wants them to keep their distance. We will communicate that to them, I think having an "on call" contact is all she needs for now. She will follow up with Dr. Dorita Fray tomorrow and me in one month. CBC shows improved Hgb levels for now. Ok to stop Dexamethasone, she has been on this 4mg  daily for bone pain for 6 weeks, and now the pain is resolved. I did refill Oxycodone which can be used as needed if she has return of abdominal or hip pain.

## 2017-03-29 ENCOUNTER — Ambulatory Visit: Payer: Self-pay | Admitting: Oncology

## 2017-03-30 ENCOUNTER — Encounter (HOSPITAL_COMMUNITY): Payer: Self-pay | Admitting: Emergency Medicine

## 2017-03-30 ENCOUNTER — Emergency Department (HOSPITAL_COMMUNITY): Payer: Medicare Other

## 2017-03-30 ENCOUNTER — Inpatient Hospital Stay (HOSPITAL_COMMUNITY)
Admission: EM | Admit: 2017-03-30 | Discharge: 2017-04-03 | DRG: 948 | Disposition: A | Discharge status: Hospice | Payer: Medicare Other | Attending: Internal Medicine | Admitting: Internal Medicine

## 2017-03-30 DIAGNOSIS — L8990 Pressure ulcer of unspecified site, unspecified stage: Secondary | ICD-10-CM | POA: Diagnosis not present

## 2017-03-30 DIAGNOSIS — Z885 Allergy status to narcotic agent status: Secondary | ICD-10-CM | POA: Diagnosis not present

## 2017-03-30 DIAGNOSIS — Z993 Dependence on wheelchair: Secondary | ICD-10-CM

## 2017-03-30 DIAGNOSIS — L89151 Pressure ulcer of sacral region, stage 1: Secondary | ICD-10-CM | POA: Diagnosis not present

## 2017-03-30 DIAGNOSIS — K219 Gastro-esophageal reflux disease without esophagitis: Secondary | ICD-10-CM | POA: Diagnosis not present

## 2017-03-30 DIAGNOSIS — Z515 Encounter for palliative care: Secondary | ICD-10-CM | POA: Diagnosis not present

## 2017-03-30 DIAGNOSIS — C9002 Multiple myeloma in relapse: Secondary | ICD-10-CM | POA: Diagnosis not present

## 2017-03-30 DIAGNOSIS — Z681 Body mass index (BMI) 19 or less, adult: Secondary | ICD-10-CM

## 2017-03-30 DIAGNOSIS — D631 Anemia in chronic kidney disease: Secondary | ICD-10-CM

## 2017-03-30 DIAGNOSIS — E785 Hyperlipidemia, unspecified: Secondary | ICD-10-CM | POA: Diagnosis present

## 2017-03-30 DIAGNOSIS — K8020 Calculus of gallbladder without cholecystitis without obstruction: Secondary | ICD-10-CM | POA: Diagnosis not present

## 2017-03-30 DIAGNOSIS — G8929 Other chronic pain: Secondary | ICD-10-CM

## 2017-03-30 DIAGNOSIS — C9000 Multiple myeloma not having achieved remission: Secondary | ICD-10-CM | POA: Diagnosis present

## 2017-03-30 DIAGNOSIS — Z66 Do not resuscitate: Secondary | ICD-10-CM | POA: Diagnosis present

## 2017-03-30 DIAGNOSIS — I129 Hypertensive chronic kidney disease with stage 1 through stage 4 chronic kidney disease, or unspecified chronic kidney disease: Secondary | ICD-10-CM

## 2017-03-30 DIAGNOSIS — M1711 Unilateral primary osteoarthritis, right knee: Secondary | ICD-10-CM | POA: Diagnosis not present

## 2017-03-30 DIAGNOSIS — D472 Monoclonal gammopathy: Secondary | ICD-10-CM | POA: Diagnosis present

## 2017-03-30 DIAGNOSIS — D649 Anemia, unspecified: Secondary | ICD-10-CM | POA: Diagnosis present

## 2017-03-30 DIAGNOSIS — N183 Chronic kidney disease, stage 3 (moderate): Secondary | ICD-10-CM | POA: Diagnosis not present

## 2017-03-30 DIAGNOSIS — R109 Unspecified abdominal pain: Secondary | ICD-10-CM | POA: Diagnosis present

## 2017-03-30 DIAGNOSIS — Z79891 Long term (current) use of opiate analgesic: Secondary | ICD-10-CM | POA: Diagnosis not present

## 2017-03-30 DIAGNOSIS — R1030 Lower abdominal pain, unspecified: Secondary | ICD-10-CM | POA: Diagnosis not present

## 2017-03-30 DIAGNOSIS — D630 Anemia in neoplastic disease: Secondary | ICD-10-CM | POA: Diagnosis not present

## 2017-03-30 DIAGNOSIS — M25559 Pain in unspecified hip: Secondary | ICD-10-CM | POA: Diagnosis present

## 2017-03-30 DIAGNOSIS — I10 Essential (primary) hypertension: Secondary | ICD-10-CM | POA: Diagnosis not present

## 2017-03-30 DIAGNOSIS — M25552 Pain in left hip: Secondary | ICD-10-CM | POA: Diagnosis not present

## 2017-03-30 DIAGNOSIS — G893 Neoplasm related pain (acute) (chronic): Secondary | ICD-10-CM | POA: Diagnosis not present

## 2017-03-30 DIAGNOSIS — R64 Cachexia: Secondary | ICD-10-CM | POA: Diagnosis not present

## 2017-03-30 DIAGNOSIS — Z79899 Other long term (current) drug therapy: Secondary | ICD-10-CM | POA: Diagnosis not present

## 2017-03-30 DIAGNOSIS — Z87891 Personal history of nicotine dependence: Secondary | ICD-10-CM

## 2017-03-30 DIAGNOSIS — C7951 Secondary malignant neoplasm of bone: Secondary | ICD-10-CM | POA: Diagnosis present

## 2017-03-30 DIAGNOSIS — R609 Edema, unspecified: Secondary | ICD-10-CM | POA: Diagnosis not present

## 2017-03-30 HISTORY — DX: Chronic kidney disease, stage 3 unspecified: N18.30

## 2017-03-30 HISTORY — DX: Chronic kidney disease, stage 3 (moderate): N18.3

## 2017-03-30 LAB — CBC WITH DIFFERENTIAL/PLATELET
Basophils Absolute: 0 10*3/uL (ref 0.0–0.1)
Basophils Relative: 0 %
EOS ABS: 0 10*3/uL (ref 0.0–0.7)
Eosinophils Relative: 0 %
HEMATOCRIT: 26.2 % — AB (ref 36.0–46.0)
HEMOGLOBIN: 8.8 g/dL — AB (ref 12.0–15.0)
LYMPHS ABS: 1 10*3/uL (ref 0.7–4.0)
Lymphocytes Relative: 11 %
MCH: 33.8 pg (ref 26.0–34.0)
MCHC: 33.6 g/dL (ref 30.0–36.0)
MCV: 100.8 fL — ABNORMAL HIGH (ref 78.0–100.0)
MONO ABS: 0.2 10*3/uL (ref 0.1–1.0)
MONOS PCT: 2 %
NEUTROS ABS: 7.9 10*3/uL — AB (ref 1.7–7.7)
NEUTROS PCT: 87 %
Platelets: 160 10*3/uL (ref 150–400)
RBC: 2.6 MIL/uL — ABNORMAL LOW (ref 3.87–5.11)
RDW: 15.4 % (ref 11.5–15.5)
WBC: 9 10*3/uL (ref 4.0–10.5)

## 2017-03-30 LAB — COMPREHENSIVE METABOLIC PANEL
ALK PHOS: 83 U/L (ref 38–126)
ALT: 22 U/L (ref 14–54)
ANION GAP: 12 (ref 5–15)
AST: 20 U/L (ref 15–41)
Albumin: 2.7 g/dL — ABNORMAL LOW (ref 3.5–5.0)
BILIRUBIN TOTAL: 1.2 mg/dL (ref 0.3–1.2)
BUN: 51 mg/dL — ABNORMAL HIGH (ref 6–20)
CALCIUM: 8.5 mg/dL — AB (ref 8.9–10.3)
CO2: 21 mmol/L — ABNORMAL LOW (ref 22–32)
Chloride: 106 mmol/L (ref 101–111)
Creatinine, Ser: 1.17 mg/dL — ABNORMAL HIGH (ref 0.44–1.00)
GFR calc non Af Amer: 41 mL/min — ABNORMAL LOW (ref >60)
GFR, EST AFRICAN AMERICAN: 48 mL/min — AB (ref >60)
Glucose, Bld: 96 mg/dL (ref 65–99)
POTASSIUM: 4.2 mmol/L (ref 3.5–5.1)
Sodium: 139 mmol/L (ref 135–145)
TOTAL PROTEIN: 7 g/dL (ref 6.5–8.1)

## 2017-03-30 LAB — URINALYSIS, ROUTINE W REFLEX MICROSCOPIC
BILIRUBIN URINE: NEGATIVE
Glucose, UA: NEGATIVE mg/dL
Hgb urine dipstick: NEGATIVE
Ketones, ur: NEGATIVE mg/dL
Leukocytes, UA: NEGATIVE
NITRITE: NEGATIVE
PROTEIN: NEGATIVE mg/dL
Specific Gravity, Urine: 1.01 (ref 1.005–1.030)
pH: 6.5 (ref 5.0–8.0)

## 2017-03-30 LAB — LIPASE, BLOOD: LIPASE: 17 U/L (ref 11–51)

## 2017-03-30 MED ORDER — FENTANYL CITRATE (PF) 100 MCG/2ML IJ SOLN
50.0000 ug | INTRAMUSCULAR | Status: DC | PRN
  Administered 2017-03-30: 50 ug via INTRAVENOUS
  Filled 2017-03-30: qty 2

## 2017-03-30 MED ORDER — ACETAMINOPHEN 650 MG RE SUPP: 650.0000 mg | Freq: Four times a day (QID) | RECTAL | Status: DC | PRN

## 2017-03-30 MED ORDER — DEXAMETHASONE 4 MG PO TABS
4.0000 mg | ORAL_TABLET | Freq: Every day | ORAL | Status: DC
  Administered 2017-04-01 – 2017-04-03 (×3): 4 mg via ORAL
  Filled 2017-03-30 (×4): qty 1

## 2017-03-30 MED ORDER — FUROSEMIDE 80 MG PO TABS
80.0000 mg | ORAL_TABLET | Freq: Every day | ORAL | Status: DC
  Administered 2017-03-30 – 2017-04-03 (×5): 80 mg via ORAL
  Filled 2017-03-30 (×5): qty 1

## 2017-03-30 MED ORDER — OXYCODONE-ACETAMINOPHEN 5-325 MG PO TABS
1.0000 | ORAL_TABLET | Freq: Once | ORAL | Status: AC
  Administered 2017-03-30: 1 via ORAL
  Filled 2017-03-30: qty 1

## 2017-03-30 MED ORDER — ORAL CARE MOUTH RINSE
15.0000 mL | Freq: Two times a day (BID) | OROMUCOSAL | Status: DC
  Administered 2017-03-30 – 2017-04-02 (×7): 15 mL via OROMUCOSAL

## 2017-03-30 MED ORDER — PROCHLORPERAZINE MALEATE 10 MG PO TABS
10.0000 mg | ORAL_TABLET | Freq: Four times a day (QID) | ORAL | Status: DC | PRN
  Filled 2017-03-30: qty 1

## 2017-03-30 MED ORDER — FENTANYL CITRATE (PF) 100 MCG/2ML IJ SOLN
50.0000 ug | Freq: Once | INTRAMUSCULAR | Status: AC
  Administered 2017-03-30: 50 ug via INTRAVENOUS
  Filled 2017-03-30: qty 2

## 2017-03-30 MED ORDER — ONDANSETRON HCL 4 MG/2ML IJ SOLN
4.0000 mg | Freq: Once | INTRAMUSCULAR | Status: AC
  Administered 2017-03-30: 4 mg via INTRAVENOUS
  Filled 2017-03-30: qty 2

## 2017-03-30 MED ORDER — ACETAMINOPHEN 500 MG PO TABS
500.0000 mg | ORAL_TABLET | ORAL | Status: DC | PRN
  Administered 2017-03-30: 500 mg via ORAL
  Filled 2017-03-30 (×2): qty 1

## 2017-03-30 MED ORDER — ENOXAPARIN SODIUM 40 MG/0.4ML ~~LOC~~ SOLN
40.0000 mg | SUBCUTANEOUS | Status: DC
  Administered 2017-03-30: 40 mg via SUBCUTANEOUS
  Filled 2017-03-30: qty 0.4

## 2017-03-30 MED ORDER — FENTANYL CITRATE (PF) 100 MCG/2ML IJ SOLN
INTRAMUSCULAR | Status: AC
  Filled 2017-03-30: qty 2

## 2017-03-30 MED ORDER — FENTANYL CITRATE (PF) 100 MCG/2ML IJ SOLN: 12.5000 ug | INTRAMUSCULAR | Status: DC | PRN

## 2017-03-30 MED ORDER — AMLODIPINE BESYLATE 5 MG PO TABS
5.0000 mg | ORAL_TABLET | Freq: Every day | ORAL | Status: DC
  Administered 2017-03-30: 5 mg via ORAL
  Filled 2017-03-30: qty 1

## 2017-03-30 MED ORDER — OXYCODONE HCL 5 MG PO TABS
5.0000 mg | ORAL_TABLET | ORAL | Status: DC | PRN
  Administered 2017-03-30 – 2017-04-01 (×4): 5 mg via ORAL
  Filled 2017-03-30 (×4): qty 1

## 2017-03-30 MED ORDER — OXYCODONE-ACETAMINOPHEN 5-325 MG PO TABS
1.0000 | ORAL_TABLET | Freq: Four times a day (QID) | ORAL | Status: DC | PRN
  Administered 2017-03-30: 1 via ORAL
  Filled 2017-03-30: qty 1

## 2017-03-30 MED ORDER — OXYCODONE-ACETAMINOPHEN 5-325 MG PO TABS
1.0000 | ORAL_TABLET | ORAL | Status: DC | PRN
  Filled 2017-03-30: qty 1

## 2017-03-30 MED ORDER — HYDRALAZINE HCL 50 MG PO TABS
50.0000 mg | ORAL_TABLET | Freq: Three times a day (TID) | ORAL | Status: DC
  Administered 2017-03-30: 50 mg via ORAL
  Filled 2017-03-30: qty 1

## 2017-03-30 MED ORDER — SENNOSIDES-DOCUSATE SODIUM 8.6-50 MG PO TABS
1.0000 | ORAL_TABLET | Freq: Two times a day (BID) | ORAL | Status: DC
  Administered 2017-03-30 – 2017-04-01 (×4): 1 via ORAL
  Filled 2017-03-30 (×6): qty 1

## 2017-03-30 MED ORDER — ACETAMINOPHEN 325 MG PO TABS
650.0000 mg | ORAL_TABLET | Freq: Four times a day (QID) | ORAL | Status: DC | PRN
  Filled 2017-03-30: qty 2

## 2017-03-30 NOTE — ED Triage Notes (Signed)
[facility] BIB EMS from home with complaint of LLQ pain. Patient had sudden onset pain approximately 3 hours ago. Patient stating she feels like she needs to have a BM. Family stated that patient was not peeing but per EMS brief was wet with strong odor on arrival. Per family, patient is slightly more confused than normal, EMS noted patient asking repetitive questions but is alert and oriented to person, place and time. Patient does have +4 pitting edema noted in bilateral LEs which is baseline per the family.

## 2017-03-30 NOTE — H&P (Signed)
Date: 03/30/2017               Patient Name:  Kayla Brooks MRN: [id number]  DOB: May 24, 1930 Age / Sex: 83 y.o., female   PCP: Axel Filler, MD         Medical Service: Internal Medicine Teaching Service         Attending Physician: Dr. Rebeca Alert Raynaldo Opitz, MD    First Contact: Dr. Trilby Drummer Pager: [phone]  Second Contact: Dr. Heber [place]  Pager: [phone]       After Hours (After 5p/  First Contact Pager: [phone]  weekends / holidays): Second Contact Pager: [phone]   History of Present Illness: Patient is a 83 year old female with a past medical history of relapsed and end stage multiple myeloma due to light chain disease, hypertension, GERD presenting to [facility] as a transfer from [facility]. Patient was lethargic and communicating minimally. History was obtained from family at bedside. Daughter states she brought the patient to the hospital due to worsening left hip pain. States patient has had chronic hip pain due to her history of cancer and the pain has been worse recently. States patient was seen by her PCP in internal medicine clinic a few days ago and hospice was recommended at that time. Per family, patient lives alone at home and they check on her intermittently. She has oxycodone 5 mg tablets at home for her ongoing pain but family is not sure how the patient has been taking this medication. They believe her appetite has been good as she was recently on steroids; stopped on Monday after PCP appointment. They believed the patient now appears more tired and is experiencing shortness of breath. She is very deconditioned and wheelchair bound. No further history could be obtained from the family.  Meds:  Current Meds  Medication Sig  . amLODipine (NORVASC) 5 MG tablet take 1 tablet by mouth once daily  . furosemide (LASIX) 80 MG tablet Take 1 tablet (80 mg total) by mouth daily.  . hydrALAZINE (APRESOLINE) 50 MG tablet Take 1 tablet (50 mg total) by mouth every 8 (eight)  hours.  . nitroGLYCERIN (NITROSTAT) 0.4 MG SL tablet Place 1 tablet (0.4 mg total) under the tongue every 5 (five) minutes as needed for chest pain.  Marland Kitchen oxyCODONE (OXY IR/ROXICODONE) 5 MG immediate release tablet Take 1 tablet (5 mg total) by mouth daily as needed for severe pain.  Marland Kitchen prochlorperazine (COMPAZINE) 10 MG tablet Take 1 tablet (10 mg total) by mouth every 6 (six) hours as needed for nausea or vomiting.     Allergies: Allergies as of 03/30/2017 - Review Complete 03/30/2017  Allergen Reaction Noted  . Morphine and related Other (See Comments) 04/06/2013   Past Medical History:  Diagnosis Date  . Arthritis    "right knee" (06/10/2015)  . Chronic anemia   . Chronic diarrhea    Archie Endo 06/10/2015  . GERD (gastroesophageal reflux disease)   . Hyperlipidemia   . Hypertension   . Monoclonal paraproteinemia   . Multiple myeloma without mention of remission   . 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 Pneumonia    "years ago" (06/10/2015)  . Urinary frequency     Family History: Could not be obtained as patient was not commnicating.  Social History: Could not be obtained as patient was not communicating.  Review of Systems: A complete ROS was negative except as per HPI.   Physical Exam: Blood pressure Marland Kitchen)  174/53, pulse 71, temperature 99.5 F (37.5 C), temperature source Oral, resp. rate (!) 22, height '5\' 5"'  (1.651 m), weight 120 lb (54.4 kg), SpO2 95 %. Physical Exam  Constitutional: No distress.  Cachectic elderly female  HENT:  Mouth/Throat: Oropharynx is clear and moist.  Eyes: Right eye exhibits no discharge. Left eye exhibits no discharge.  Pupils constricted (received opiates for pain)  Neck: Neck supple. No tracheal deviation present.  Cardiovascular: Normal rate, regular rhythm and intact distal pulses.   Pulmonary/Chest: Effort normal and breath sounds normal. No respiratory distress. She has no wheezes. She has no rales.  Abdominal:  Bowel sounds are normal. She exhibits no distension. There is no tenderness.  Musculoskeletal:  +2 pitting edema of bilateral lower extremities  Neurological:  Lethargic, somnolent Easily arousable on command Following commands She knows her name and date of birth. Knows she is in [place].  Skin: Skin is warm and dry.    Assessment & Plan by Problem: Active Problems:   Myeloma ([place])  Terminal illness: Patient has a history of relapsed and end stage multiple myeloma due to light chain disease. Please refer to prior notes from oncology (Dr. Alen Blew). Per recent note from PCP, she has refused home hospice. She is now presenting to the hospital with worsening left hip pain. CT of chest, abdomen, and pelvis showing known multiple myeloma and no acute abnormality. UA not suggestive of infection. CMP unremarkable. Lipase normal. It is unclear how patient has been taking her pain medication (oxycodone) at home. Patient received 2 doses of fentanyl 50 g and 1 dose of percocet 5-325 mg in the ED and appeared comfortable on exam.  -Admit to [facility] -Consult palliative care. Patient will likely benefit from inpatient hospice.  -Percocet 5-325 mg q4 prn  -Tylenol when necessary -Continue home Compazine 10 mg every 6 hours as needed for nausea -Senokot-S -PT/OT  Hypertension: Continue home medications. -Amlodipine 5 mg daily -Hydralazine 50 mg every 8 hours -Lasix 80 mg daily - recently prescribed by PCP for bilateral lower extremity edema  Chronic anemia: Likely related to plasma cell disorder. Hemoglobin stable at 8.8. -No acute intervention needed at this time  CKD 3: Likely due to light chain nephropathy. Cr 1.1 and GFR 48. -Continue to monitor   Possible sacral pressure ulcer: Reported by family. Unable to examine as patient was somnolent and could not cooperate with exam. -Consult to wound care   DVT ppx: Lovenox Diet: Regular  Code status: Patient was lethargic and did not  communicate her wish. Family at bedside including daughter wishes for her to be DO NOT RESUSCITATE.   Dispo: Admit patient to Inpatient with expected length of stay greater than 2 midnights.  Signed: Shela Leff, MD 03/30/2017, 12:16 PM  Pager: [phone]

## 2017-03-30 NOTE — ED Notes (Signed)
PT IS A TRANSFER TO [facility]. PT IS A FAMILY PRACTICE. ADMISSION MD TO BE NOTIFIED WHEN PT ARRIVES TO FLOOR

## 2017-03-30 NOTE — ED Notes (Signed)
Patient transported to CT 

## 2017-03-30 NOTE — Discharge Instructions (Signed)
You may take Oxycodone 5 mg 1 to 2 tablets every 6 hours as needed for pain.  You may take Tylenol 1000 mg every 6 hours as needed for pain.  Please follow-up with Dr. Evette Doffing for further management of pain from your myeloma.

## 2017-03-30 NOTE — ED Notes (Signed)
Bed: BO47 Expected date:  Expected time:  Means of arrival:  Comments: 83 yr old confused

## 2017-03-30 NOTE — ED Notes (Signed)
Patient returned from CT

## 2017-03-30 NOTE — Evaluation (Signed)
Physical Therapy Evaluation Patient Details Name: Kayla Brooks MRN: [id number] DOB: 08-Jan-1930 Today's Date: 03/30/2017   History of Present Illness  Patient is a 83 y/o female who is transferred from [facility] due to worsening left hip pain. CT of chest, abdomen, and pelvis showing known multiple myeloma and no acute abnormality. 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   Clinical Impression  Patient presents with lethargy, pain, confusion, decreased activity tolerance, fatigue and impaired mobility s/p above. Tolerated sitting EOB ~12 mins requiring assist for self feeding. Fatigues very quickly. Some incoordination/tremoring visible in BUEs when reaching for a cup. Per son, pt has required more assist at home and has had a functional decline recently. Per son, pt planning to be discharged to [facility] for inpatient hospice. Pt not appropriate for therapy at this time. Will sign off. Please re consult if anything changes. Thanks.    Follow Up Recommendations No PT follow up    Equipment Recommendations  None recommended by PT    Recommendations for Other Services       Precautions / Restrictions Precautions Precautions: Fall Restrictions Weight Bearing Restrictions: No      Mobility  Bed Mobility Overal bed mobility: Needs Assistance Bed Mobility: Supine to Sit;Sit to Supine     Supine to sit: Mod assist;HOB elevated;+2 for physical assistance Sit to supine: Max assist;+2 for physical assistance;HOB elevated   General bed mobility comments: Assist to bring LEs to EOB, and elevate trunk. Able to assist with reaching for the rail with max cues.   Transfers                 General transfer comment: Deferred secondary to lethargy.  Ambulation/Gait                Stairs            Wheelchair Mobility    Modified Rankin (Stroke Patients Only)       Balance Overall  balance assessment: Needs assistance Sitting-balance support: Feet supported;No upper extremity supported Sitting balance-Leahy Scale: Fair Sitting balance - Comments: Able to sit unsupported and assist with self feeding for ~12 mins with assist. Rocking back and forth but no LOB. Fatigues easily.                                     Pertinent Vitals/Pain Pain Assessment: Faces Faces Pain Scale: Hurts even more Pain Location: bottom  Pain Descriptors / Indicators: Sore Pain Intervention(s): Monitored during session;Repositioned    Home Living Family/patient expects to be discharged to:: Hospice/Palliative care (plans to transfer to [facility] for inpatient hospice) Living Arrangements: Alone             Home Equipment: Gilford Rile - 2 wheels;Wheelchair - manual;Bedside commode;Hand held shower head;Cane - single point      Prior Function Level of Independence: Needs assistance   Gait / Transfers Assistance Needed: Uses w/c mostly. has been going down hill the last few weeks. Family is usually with patient most of the day.  ADL's / Homemaking Assistance Needed: Dressing self but needs help with shoes.         Hand Dominance   Dominant Hand: Right    Extremity/Trunk Assessment   Upper Extremity Assessment Upper Extremity Assessment: Defer to OT evaluation    Lower Extremity Assessment Lower Extremity  Assessment: Generalized weakness    Cervical / Trunk Assessment Cervical / Trunk Assessment: Kyphotic  Communication   Communication: HOH  Cognition Arousal/Alertness: Lethargic Behavior During Therapy: Flat affect Overall Cognitive Status: Difficult to assess                                 General Comments: Seems a little confused- son agrees. Making jokes. Talking nonsensical at times.       General Comments General comments (skin integrity, edema, etc.): Son present during session.    Exercises     Assessment/Plan    PT  Assessment Patent does not need any further PT services  PT Problem List         PT Treatment Interventions      PT Goals (Current goals can be found in the Care Plan section)  Acute Rehab PT Goals Patient Stated Goal: none stated, per son to go to [facility] PT Goal Formulation: All assessment and education complete, DC therapy    Frequency     Barriers to discharge        Co-evaluation               AM-PAC PT "6 Clicks" Daily Activity  Outcome Measure Difficulty turning over in bed (including adjusting bedclothes, sheets and blankets)?: Unable Difficulty moving from lying on back to sitting on the side of the bed? : Unable Difficulty sitting down on and standing up from a chair with arms (e.g., wheelchair, bedside commode, etc,.)?: Unable Help needed moving to and from a bed to chair (including a wheelchair)?: Total Help needed walking in hospital room?: Total Help needed climbing 3-5 steps with a railing? : Total 6 Click Score: 6    End of Session Equipment Utilized During Treatment: Oxygen Activity Tolerance: Patient limited by lethargy;Patient limited by fatigue Patient left: in bed;with call bell/phone within reach;with family/visitor present;with bed alarm set Nurse Communication: Mobility status PT Visit Diagnosis: Muscle weakness (generalized) (M62.81);Pain Pain - part of body:  (bottom)    Time: 1601-0932 PT Time Calculation (min) (ACUTE ONLY): 25 min   Charges:   PT Evaluation $PT Eval Moderate Complexity: 1 Mod PT Treatments $Therapeutic Activity: 8-22 mins   PT G Codes:        Wray Kearns, PT, DPT [phone]    Marguarite Arbour A Ellsworth Waldschmidt 03/30/2017, 3:31 PM

## 2017-03-30 NOTE — ED Provider Notes (Signed)
TIME SEEN: 3:57 AM  CHIEF COMPLAINT: abdominal pain  HPI: Patient is an 83 year old female with history of multiple myeloma, hypertension, hyperlipidemia, anemia requiring previous blood transfusions who presents emergency department with left lower abdominal pain, left pelvic pain. Has had similar symptoms in the past. Her family is at bedside states that she was screaming in pain at home and they brought her to the emergency department. She does have oxycodone 5 mg at home for pain and they only give her one tablet a day. No other pain medication given. She has not had any fevers, nausea, vomiting, diarrhea. No chest pain or shortness of breath. No dysuria, hematuria, vaginal bleeding or discharge. Daughter reports she was complaining of feeling like having a hard time urinating but has urinated several times here without difficulty. No new injury. She has lower extremity swelling has been present for several days. Was seen by her primary care physician Dr. Evette Doffing with [facility] and started on Lasix 80 mg daily. Patient is being told by oncology at there is no further treatment. Last chemotherapy was months ago. It appears her primary care physician is trying to get hospice involved. Family reports that they are planning to come to the house tomorrow.   PCP - Lalla Brothers with IM  ROS: See HPI Constitutional: no fever  Eyes: no drainage  ENT: no runny nose   Cardiovascular:  no chest pain  Resp: no SOB  GI: no vomiting GU: no dysuria Integumentary: no rash  Allergy: no hives  Musculoskeletal: no leg swelling  Neurological: no slurred speech ROS otherwise negative  PAST MEDICAL HISTORY/PAST SURGICAL HISTORY:  Past Medical History:  Diagnosis Date  . Arthritis    "right knee" (06/10/2015)  . Chronic anemia   . Chronic diarrhea    Archie Endo 06/10/2015  . GERD (gastroesophageal reflux disease)   . Hyperlipidemia   . Hypertension   . Monoclonal paraproteinemia   .  Multiple myeloma without mention of remission   . 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 Pneumonia    "years ago" (06/10/2015)  . Urinary frequency     MEDICATIONS:  Prior to Admission medications   Medication Sig Start Date End Date Taking? Authorizing Provider  amLODipine (NORVASC) 5 MG tablet take 1 tablet by mouth once daily 02/28/17   Axel Filler, MD  feeding supplement, ENSURE ENLIVE, (ENSURE ENLIVE) LIQD Take 237 mLs by mouth 2 (two) times daily between meals. 01/18/17   Reyne Dumas, MD  furosemide (LASIX) 80 MG tablet Take 1 tablet (80 mg total) by mouth daily. 03/28/17   Axel Filler, MD  hydrALAZINE (APRESOLINE) 50 MG tablet Take 1 tablet (50 mg total) by mouth every 8 (eight) hours. 01/18/17   Reyne Dumas, MD  nitroGLYCERIN (NITROSTAT) 0.4 MG SL tablet Place 1 tablet (0.4 mg total) under the tongue every 5 (five) minutes as needed for chest pain. 03/28/17   Axel Filler, MD  oxyCODONE (OXY IR/ROXICODONE) 5 MG immediate release tablet Take 1 tablet (5 mg total) by mouth daily as needed for severe pain. 03/28/17   Axel Filler, MD  prochlorperazine (COMPAZINE) 10 MG tablet Take 1 tablet (10 mg total) by mouth every 6 (six) hours as needed for nausea or vomiting. 02/05/17   Lacroce, Hulen Shouts, MD    ALLERGIES:  Allergies  Allergen Reactions  . Morphine And Related Other (See Comments)    Abdominal Pain    SOCIAL HISTORY:  Social History  Substance Use Topics  . Smoking status: Former Smoker    Packs/day: 2.00    Years: 40.00    Types: Cigarettes  . Smokeless tobacco: Never Used     Comment: "quit smoking years ago"  . Alcohol use 0.0 oz/week     Comment: 06/10/2015 "drank socially in my younger days"    FAMILY HISTORY: Family History  Problem Relation Age of Onset  . Multiple myeloma Neg Hx     EXAM: BP (!) 174/48 (BP Location: Left Arm)   Pulse 92   Temp 98.9 F (37.2 C) (Oral)   Resp 15   Ht 5'  5" (1.651 m)   Wt 54.4 kg (120 lb)   SpO2 98%   BMI 19.97 kg/m  CONSTITUTIONAL: Alert and oriented and responds appropriately to questions. Elderly, appears uncomfortable, thin HEAD: Normocephalic EYES: Conjunctivae clear, pupils appear equal, EOMI ENT: normal nose; moist mucous membranes NECK: Supple, no meningismus, no nuchal rigidity, no LAD  CARD: RRR; S1 and S2 appreciated; no murmurs, no clicks, no rubs, no gallops RESP: Normal chest excursion without splinting or tachypnea; breath sounds clear and equal bilaterally; no wheezes, no rhonchi, no rales, no hypoxia or respiratory distress, speaking full sentences ABD/GI: Normal bowel sounds; non-distended; soft, tender to palpation over the left lower quadrant, no rebound, no guarding, no peritoneal signs, no hepatosplenomegaly BACK:  The back appears normal and is non-tender to palpation, there is no CVA tenderness EXT: mildly tender over the left ASIS, no leg length discrepancy, no tenderness over the left lateral hip, no erythema or warmth, Normal ROM in all joints; otherwise extremities are non-tender to palpation; no edema; normal capillary refill; no cyanosis, no calf tenderness or swelling    SKIN: Normal color for age and race; warm; no rash NEURO: Moves all extremities equally, reports normal sensation diffusely, normal speech, no facial asymmetry PSYCH: The patient's mood and manner are appropriate. Grooming and personal hygiene are appropriate.  MEDICAL DECISION MAKING: Pt here with left lower quadrant pain. I suspect that this is probably her chronic pain from her known myeloma. Both patient and family are extremely poor historians. We'll give fentanyl for pain control check her labs, urine and CT scan to see if there are any other causes of her pain today that could be acute.  ED PROGRESS: Patient's labs show anemia with hemoglobin of 8.8 which appears to bear she normally runs. Creatinine is 1.17 which has in stable and is likely  from her light chain myeloma. Urine shows no sign of infection or dehydration. CT scan shows no acute abnormality.  Pt has diffuse heterogeneity within the visualized osseous structures which is likely the cause of her pain. She has cholelithiasis but no right upper quadrant tenderness on exam. Normal LFTs and lipase. Patient's pain was initially well controlled with fentanyl. With any movement however she began screaming out in pain. We'll give another dose of fentanyl she is allergic to morphine. We'll also give her Percocet here.family has requested admission for pain control just feels reasonable given her significant myeloma. I feel patient could possibly benefit from placement or more intensive hospice services at home as family seems to feel very uncomfortable caring for her. Family needs significant education.    6:45 AM Discussed patient's case with IM resident, Dr. Danford Bad.  I have recommended admission and patient (and family if present) agree with this plan. Admitting physician will place admission orders.   I reviewed all nursing notes, vitals, pertinent previous records, EKGs, lab  and urine results, imaging (as available).      Celese Banner, Delice Bison, DO 03/30/17 [phone]

## 2017-03-30 NOTE — Consult Note (Addendum)
[name] Nurse wound consult note Reason for Consult: Consult requested for sacrum. Wound type: Pt was noted to have a stage 1 to this location, also small partial thickness fissure in gluteal fold, .5X.1X.1cm, pink and moist Pressure Injury POA: Yes Measurement: 2X2cm stage 1 pressure injury Wound bed: darker-colored skin  Dressing procedure/placement/frequency: Foam dressing to protect and promote healing.  Discussed plan of care with patient and family member at the bedside. Please re-consult if further assistance is needed.  Thank-you,  Julien Girt MSN, [place], [place], [place], [place]

## 2017-03-30 NOTE — Progress Notes (Signed)
RN notified Dr. Tarri Abernethy about BP of 120/53 and inquired if Hydralazine should be held, order received to hold Hydralazine tonight.  P.J. Linus Mako, RN

## 2017-03-30 NOTE — H&P (Signed)
Date: 03/30/2017  Patient name: Kayla Brooks  Medical record number: [id number]  Date of birth: 01-May-1930   I saw and evaluated the patient. I reviewed the resident's note and I agree with the resident's findings and plan as documented in the resident's note.  Chief Complaint(s): Hip pain  History - key components related to admission:  83 yo F with h/o multiple myeloma resistant to chemotherapy p/w worsening left hip pain. She has had some bone pain related to her multiple myeloma, including pain in that left hip, but her daughter brought her in because it was getting so much worse. She has oxycodone available at home, but admits that she sometimes forgets to take it and also admits that she takes her medicines "most days". She has hospice involved and just met with her PCP, Dr. Evette Doffing, 2 days ago. At that time, they stopped dexamethasone, which she had been taking for bone pain, because her pain seemed pretty well-controlled at the time. They also discussed reincorporating hospice care at home, because she had told them to stop coming due to not liking strangers come into her home. However, after discussing with Dr. Evette Doffing, she agreed to begin working with them again.  When I discussed this with her and her daughter today, she reported that she would like the hospice workers to call before the arrive. Her daughter said that Kayla Brooks often hides her symptoms and she keeps her family in the dark a bit about how she's doing because she likes being independent and doesn't want to be a burden. However, she admits that there are times when she can't walk due to pain or fatigue, and she also admitted to me that she would benefit from having someone with her at all times.   Earlier today, her son spoke with someone from hospice and discussed [employer]. Kayla Brooks has left this up to her children to decide, and they are still discussing it.   At the time of my exam, she reported her pain was better  controlled after receiving pain medicine, but that it was very painful to urinate.  Physical Exam - key components related to admission:  Vitals:   03/30/17 0812 03/30/17 0900 03/30/17 1002 03/30/17 1336  BP: 134/65 (!) 109/49 (!) 174/53 (!) 151/57  Pulse: 81 73 71 79  Resp: 16  (!) 22   Temp:   99.5 F (37.5 C) 98.8 F (37.1 C)  TempSrc:   Oral Oral  SpO2: 100% 99% 95% 100%  Weight:      Height:       General: Alert, pleasant, not in distress HEENT: Moist mucous membranes Neck: JVD to midneck Cardiovascular: Irregular, normal rate, no murmurs rubs or gallops Pulmonary: Clear in anterior lung fields Abdominal: Soft, nontender, not distended, normal bowel sounds Extremities: Warm, 2+ pitting edema of lower legs bilaterally Skin: No significant rashes or lesions Neuro: Awake but slightly drowsy  Assessment and Plan: I have seen and evaluated the patient as outlined above. I agree with the formulated Assessment and Plan as detailed in the residents' note, with the following changes:   Active Problems:   Myeloma ([place])   Hip pain   Pressure injury of skin   1. Hip pain -Received fentanyl in ED, has listed morphine allergy, but on review appears to be intolerance, will readdress with family. In meantime will treat with oxycodone 5 mg q3h. She reports the pain control at home was inadequate because it didn't last more than a few hours, but  the relief was sufficient when she took it. Will titrate as needed. -She will also need aggressive bowel regimen -Will also restart dexamethasone, which may have been helping control her chronic pain, 4 mg daily  2. End stage multiple myeloma with hospice -Will continue discussion with family regarding disposition when pain is controlled -Palliative consult for assistance with discussions -DNR per patient and family wishes -No role for DVT prophylaxis, will address her BP meds with her tomorrow -Continue lasix as her leg edema is  bothersome  Oda Kilts, MD 10/10/20188:56 PM

## 2017-03-31 MED ORDER — ACETAMINOPHEN 650 MG RE SUPP: 650.0000 mg | Freq: Four times a day (QID) | RECTAL | Status: DC | PRN

## 2017-03-31 MED ORDER — ACETAMINOPHEN 325 MG PO TABS
650.0000 mg | ORAL_TABLET | Freq: Four times a day (QID) | ORAL | Status: DC | PRN
  Administered 2017-03-31 (×2): 650 mg via ORAL
  Filled 2017-03-31 (×2): qty 2

## 2017-03-31 NOTE — Progress Notes (Signed)
RN notified Dr. Tarri Abernethy about BP of 121/50, ordered to hold Hydralazine at this time. RN will continue to monitor patient and report any problems to MD.  P.J. Linus Mako, RN

## 2017-03-31 NOTE — Progress Notes (Addendum)
   Subjective:  Patient was seen resting comfortably in her bed this morning with family at bedside. She states that her pain is currently well controlled. We spoke with the family about what they wanted to do next for the patient considering her hospice status. They stated that the were considering residential hospice for the patient. She stated that she was okay with either option (residential hospice or continuing with home hospice as she had previously.) Spoke with patient about listed morphine allergy, she states that she gets abdominal discomfort with morphine.  Objective:  Vital signs in last 24 hours: Vitals:   03/30/17 2154 03/31/17 0513 03/31/17 0555 03/31/17 1544  BP: (!) 120/53 (!) 107/41 (!) 121/50 126/66  Pulse: 89 77 67 76  Resp: '18 19  18  '$ Temp: 98.9 F (37.2 C) 98.9 F (37.2 C)  99.1 F (37.3 C)  TempSrc: Oral Oral  Oral  SpO2: 100% 100%  100%  Weight:      Height:       Physical Exam  Constitutional: No distress.  Cachectic elderly female  HENT:  Mouth/Throat: Oropharynx is clear and moist.  Eyes: Right eye exhibits no discharge. Left eye exhibits no discharge.  Neck: Neck supple. No tracheal deviation present.  Cardiovascular: Normal rate, regular rhythm and intact distal pulses.   Pulmonary/Chest: Effort normal and breath sounds normal. No respiratory distress. She has no wheezes. She has no rales.  Abdominal: Bowel sounds are normal. She exhibits no distension. There is no tenderness.  Musculoskeletal:  mild pitting edema of bilateral lower extremities  Neurological:  Lethargic, somnolent Easily arousable on command  Skin: Skin is warm and dry.   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 Please refer to prior notes from oncology (Dr. Alen Blew). Presented with worsening left hip pain. CT showed known multiple myeloma and no acute abnormality. UA not suggestive of infection.  CMP unremarkable. Lipase normal. Unclear how patient takes pain meds (oxycodone) at home. Palliative has been consulted. Hospice and SW assisting in search for placement in a hospice home. Patient stated morphine allergy was abdominal discomfort; will consider trial of morphine in the future. - Percocet 5-325 mg q4 prn  - Tylenol when necessary - Continue home Compazine 10 mg every 6 hours as needed for nausea - Continue home dexamethasone - Senokot-S - PT/OT  Hypertension: Holding home medications in the setting of episodes of hypotension and hospice care. - Lasix 80 mg daily - recently prescribed by PCP for bilateral lower extremity edema  Chronic anemia: Likely related to plasma cell disorder. Hemoglobin stable at 8.8. 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 Cr 1.1 and GFR 48. - Continue to monitor  Possible sacral pressure ulcer: Reported by family. Unable to examine as patient was somnolent and could not cooperate with exam. - Consult to wound care   DVT ppx: Lovenox Diet: Regular  Code status: Patient was lethargic and did not communicate her wish. Family at bedside including daughter wishes for her to be DO NOT RESUSCITATE.   Dispo: Anticipated discharge in approximately 1-3 Day(s).   Neva Seat, MD 03/31/2017, 3:51 PM Pager: [phone]

## 2017-03-31 NOTE — Progress Notes (Signed)
Per notes, patient's family would like residential Hospice placement referral. At this time. [facility] is full and has five people on the waiting list. [facility] is also full. [facility] is full with a wait list. CSW to also reach out to [facility].   Percell Locus Chelsie Burel LCSWA [phone]

## 2017-03-31 NOTE — Progress Notes (Signed)
Palliative Medicine RN Note: Chart was reviewed by PMT provider yesterday. Goals are clear (hospice at d/c), and family is just deciding on home vs [employer]. There does not appear to be a role for PMT at this time.  Should the family's goals change or become unclear, please call our office. Otherwise, we will hold off on seeing Kayla Brooks.  Marjie Skiff Enyah Moman, RN, BSN, [facility] 03/31/2017 7:47 AM Cell [phone] 8:00-4:00 Monday-Friday Office [phone]

## 2017-03-31 NOTE — Progress Notes (Signed)
[facility] Liaison:  RN  Received request from [place], [place], for family interest in [facility].  Chart reviewed and spoke with [name], son, to acknowledge referral.  Unfortunately, [facility] is not able to offer a room today.  Family and LCSW are aware [facility] liaison will follow up with LCSW and family tomorrow or sooner if room becomes available.  Please do not hesitate to call with questions.   Thank you for the referral.  Edyth Gunnels, RN, [facility] Liaison [phone]  All hospital liaisons are on [place].

## 2017-03-31 NOTE — Consult Note (Signed)
[facility]:  Left message with pt's family to return call. Call made at request of MSW for placement at hospice home.  Will await family to call back before continuing to review chart. Webb Silversmith RN [phone]

## 2017-03-31 NOTE — Progress Notes (Signed)
Nutrition Brief Note  Chart reviewed. Pt now transitioning to comfort care.  No further nutrition interventions warranted at this time.  Please re-consult as needed.   Any Mcneice A. Ayson Cherubini, RD, LDN, CDE Pager: [phone] After hours Pager: [phone]  

## 2017-03-31 NOTE — Evaluation (Signed)
Occupational Therapy Evaluation Patient Details Name: Kayla Brooks MRN: [id number] DOB: September 24, 1929 Today's Date: 03/31/2017    History of Present Illness Patient is a 83 y/o female who is transferred from [facility] due to worsening left hip pain. CT of chest, abdomen, and pelvis showing known multiple myeloma and no acute abnormality. 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    Clinical Impression   This 83 y/o F presents with the above. Limited eval this session as Pt presents with increased confusion, lethargy, and fatigue. Pt's family present and providing education to both Pt and family regarding pressure relief and ADL completion. Pt's family report feeling comfortable assisting Pt with ADLs PRN after discharge as anticipate Pt will require Max-total assist for LB ADLs, able to only complete bed level/UB ADLs this session. Pt's family reports plan for hospice care after discharge (hoping for inpatient hospice).  Pt not appropriate for continued therapy at this time. Will sign off. Please re consult if anything changes. Thank you.     Follow Up Recommendations  Other (comment) (Pt planning to d/c to inpatient hospice )    Equipment Recommendations  None recommended by OT           Precautions / Restrictions Precautions Precautions: Fall Restrictions Weight Bearing Restrictions: No                                                    ADL either performed or assessed with clinical judgement   ADL Overall ADL's : Needs assistance/impaired Eating/Feeding: Set up;Supervision/ safety;Bed level   Grooming: Set up;Bed level;Supervision/safety   Upper Body Bathing: Minimal assistance;Bed level   Lower Body Bathing: Maximal assistance;Bed level   Upper Body Dressing : Minimal assistance;Bed level   Lower Body Dressing: Total assistance;Bed level       Toileting- Clothing  Manipulation and Hygiene: Total assistance;Bed level         General ADL Comments: Pt finishing eating lunch upon entering room, able to complete task without assist; Pt with increased fatigue and declining EOB activity this session; educated Pt and family on pressure relief while in bed as Pt's daughter-in-law reports Pt has pressure sore on buttocks                          Pertinent Vitals/Pain Pain Assessment: No/denies pain     Hand Dominance Right   Extremity/Trunk Assessment Upper Extremity Assessment Upper Extremity Assessment: Generalized weakness   Lower Extremity Assessment Lower Extremity Assessment: Defer to PT evaluation   Cervical / Trunk Assessment Cervical / Trunk Assessment: Kyphotic   Communication Communication Communication: HOH   Cognition Arousal/Alertness: Lethargic Behavior During Therapy: Flat affect Overall Cognitive Status: Difficult to assess                                 General Comments: Pt appears confused/decreased STM; asked throughout session if it is raining outside after therapist had already cued Pt to look outside to see the rain    General Comments  daughter and daughter-in-law present during session     Exercises General Exercises - Upper Extremity Shoulder Flexion: AROM;AAROM;Both;Supine Shoulder Extension: AROM;AAROM;Both;Supine  Home Living Family/patient expects to be discharged to:: Hospice/Palliative care (plans to transfer to [facility] for inpatient hospice) Living Arrangements: Alone                           Home Equipment: Gilford Rile - 2 wheels;Wheelchair - manual;Bedside commode;Hand held shower head;Cane - single point          Prior Functioning/Environment Level of Independence: Needs assistance  Gait / Transfers Assistance Needed: Uses w/c mostly. has been going down hill the last few weeks. Family is usually with patient most of the day. ADL's / Homemaking Assistance  Needed: Dressing self but needs help with shoes.             OT Problem List: Decreased strength;Decreased cognition;Decreased activity tolerance            OT Goals(Current goals can be found in the care plan section) Acute Rehab OT Goals Patient Stated Goal: per family to go to [facility] OT Goal Formulation: With patient/family                                 AM-PAC PT "6 Clicks" Daily Activity     Outcome Measure Help from another person eating meals?: A Little Help from another person taking care of personal grooming?: A Little Help from another person toileting, which includes using toliet, bedpan, or urinal?: Total Help from another person bathing (including washing, rinsing, drying)?: A Lot Help from another person to put on and taking off regular upper body clothing?: A Lot Help from another person to put on and taking off regular lower body clothing?: Total 6 Click Score: 12   End of Session Nurse Communication: Mobility status  Activity Tolerance: Patient limited by lethargy;Patient limited by fatigue Patient left: in bed;with call bell/phone within reach;with family/visitor present  OT Visit Diagnosis: Adult, failure to thrive (R62.7);Muscle weakness (generalized) (M62.81)                Time: 9476-5465 OT Time Calculation (min): 22 min Charges:  OT General Charges $OT Visit: 1 Visit OT Evaluation $OT Eval Moderate Complexity: 1 Mod G-Codes:     Lou Cal, OT Pager [phone] 03/31/2017   Raymondo Band 03/31/2017, 3:37 PM

## 2017-03-31 NOTE — Progress Notes (Signed)
  Date: 03/31/2017  Patient name: Kayla Brooks  Medical record number: [id number]  Date of birth: [birth year]   I have seen and evaluated this patient and I have discussed the plan of care with the house staff. Please see their note for complete details. I concur with their findings with the following additions/corrections:   See together on rounds with the team this morning. Her daughter and son and daughter-in-law were all present at the time. Her pain appears better controlled. Our primary goal now is determining the appropriate placement for her on discharge. The family seems to prefer placement with hospice facility, but there are no beds available at this time. She is agreeable to this plan and says she will leave it up to her family. They're also considering whether they might be able to bring her home to one of their houses and provide care for her there at home with the assistance of hospice.  Oda Kilts, MD 03/31/2017, 5:06 PM

## 2017-04-01 DIAGNOSIS — L89151 Pressure ulcer of sacral region, stage 1: Secondary | ICD-10-CM

## 2017-04-01 NOTE — Progress Notes (Signed)
   Subjective:  Patient was seen resting in her bed this morning with family at bedside. She states that her pain is currently controlled. Family has decided and the patient agreed this morning that she will go to her son's home with home hospice on discharge.  Objective:  Vital signs in last 24 hours: Vitals:   03/31/17 0555 03/31/17 1544 03/31/17 2129 04/01/17 0522  BP: (!) 121/50 126/66 (!) 125/49 (!) 122/59  Pulse: 67 76 87 75  Resp:  '18 17 18  '$ Temp:  99.1 F (37.3 C) 98.7 F (37.1 C) 99.9 F (37.7 C)  TempSrc:  Oral Oral Oral  SpO2:  100% 90% 97%  Weight:      Height:       Physical Exam  Constitutional: No distress.  Cachectic elderly female  HENT:  Mouth/Throat: Oropharynx is clear and moist.  Eyes: Right eye exhibits no discharge. Left eye exhibits no discharge.  Neck: Neck supple. No tracheal deviation present.  Cardiovascular: Normal rate, regular rhythm and intact distal pulses.   Pulmonary/Chest: Effort normal and breath sounds normal. No respiratory distress. She has no wheezes. She has no rales.  Abdominal: Bowel sounds are normal. She exhibits no distension. There is no tenderness.  Musculoskeletal:  mild pitting edema of bilateral lower extremities  Neurological:  Lethargic, somnolent Easily arousable on command  Skin: Skin is warm and dry.   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 Please refer to prior notes from oncology (Dr. Alen Blew). Presented with worsening left hip pain. CT showed known multiple myeloma and no acute abnormality. UA not suggestive of infection. CMP unremarkable. Lipase normal. Unclear how patient takes pain meds (oxycodone) at home. Palliative has been consulted. Hospice and SW assisting in search for placement in a hospice home. Patient stated morphine allergy was abdominal discomfort; will consider trial of morphine in the future. - Percocet 5-325 mg  q3 prn  - Tylenol when necessary - Continue home Compazine 10 mg every 6 hours as needed for nausea - Continue home dexamethasone - Senokot-S - PT/OT  Hypertension: Holding home medications in the setting of episodes of hypotension and hospice care. - Lasix 80 mg daily - recently prescribed by PCP for bilateral lower extremity edema  Chronic anemia: Likely related to plasma cell disorder. Hemoglobin stable at 8.8. 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 Cr 1.1 and GFR 48. - Continue to monitor  Possible sacral pressure ulcer: Reported by family. Wound care reports stage 1 pressure injury at sacrum 2X2cm and partial thickness fissure in gluteal fold, .5X.1X.1cm.  DVT ppx: Lovenox Diet: Regular  Code status: Patient was lethargic and did not communicate her wish. Family at bedside including daughter wishes for her to be DO NOT RESUSCITATE.   Dispo: Anticipated discharge in approximately 1-2 Day(s).   Neva Seat, MD 04/01/2017, 6:37 AM Pager: [phone]

## 2017-04-01 NOTE — Progress Notes (Signed)
CM received consult: home hospice. CM spoke with pt/ 2 daughters and son @ bedside and confirmed plan is to d/c pt with home hospice. Son stated [facility] was selected prior to admission and would like for them to provide the home hospice services. CM made referral to Jen/[facility] @ [phone].  Delsa Sale stated she recently spoke with and refer was made for home hospice. Pt currently has home assessment with [facility] on Sunday, 04/03/2017 @ 3pm. Delsa Sale wanted to know when will pt d/c. If d/c prior to Sunday there is availability for home assessment on Saturday,04/02/2017 @ 3pm. CM has paged MD to shared information and learn of d/c plan (date). Awaiting call back, CM to f/u with [facility]. Whitman Hero RN,BSN,CM

## 2017-04-01 NOTE — Progress Notes (Signed)
Hospice and [facility] Hospital Liaison:  RN visit.  Notified by Dewain Penning, of patient/family request for [facility] services at home after discharge.  Chart and patient information under review by [facility] physician.  Hospice eligibility pending at this time.   Writer spoke with Kayla Brooks, son, to initiate education related to hospice philosophy, services and team approach to care.  Family verbalized understanding of information given.  Per discussion, plan is for discharge to home by [facility] on 04/02/17.  Patient is scheduled for AV visit on 04/03/17 at 600pm.  Please send signed and completed DNR form home with patient/family.  Patient will need prescriptions for discharge comfort medications.  DME needs have been discussed, patient currently has a cane in the home.   Family requests O2 concentrator for delivery to the home.  Son declined hospital bed or additional equipment at this time.  Home address will be:  [address] [place], [place], [place] [address].  Contact person for delivery is Kayla Brooks, son, at [phone].  [facility] equipment manager has been notified and will contact [place] to arrange delivery to the home.   [facility] Referral Center aware of the above.  Completed discharge summary will need to be faxed to [facility] at [phone], when final.  Please notify [facility] when patient is ready to leave the unit at discharge.  Call [phone] or [phone] after 5pm.  [facility] information and contact numbers given to patient during visit.  Above information shared with Levada Dy, [facility].  Please call with any hospice related questions.  Thank you for this referral.  Edyth Gunnels, RN, [facility] Liaison [phone]  All hospital liaisons are now on [place].

## 2017-04-01 NOTE — Progress Notes (Addendum)
3:30pm-CSW alerted that patient's family choosing to take patient home with Hospice. RNCM aware. CSW signing off.  11:28am: Hospice facilities still do not have beds currently.   Percell Locus Devyn Griffing LCSWA [phone]

## 2017-04-01 NOTE — Progress Notes (Signed)
  Date: 04/01/2017  Patient name: Kayla Brooks  Medical record number: [id number]  Date of birth: May 03, 1930   I have seen and evaluated this patient and I have discussed the plan of care with the house staff. Please see their note for complete details. I concur with their findings with the following additions/corrections:   Pain reasonably well managed, although she noted some pain when seen by Dr. Trilby Drummer this morning. Working to arrange discharge plan with her and her family. After our discussion today, we are in agreement that the best plan for her is to go home with hospice and stay with her son, [name]. The family will ensure she has 24 7 care that will be supplemented by home hospice.  Oda Kilts, MD 04/01/2017, 5:37 PM

## 2017-04-01 NOTE — Discharge Summary (Signed)
Name: Kayla Brooks MRN: [id number] DOB: [birth year] 83 y.o. PCP: Axel Filler, MD  Date of Admission: 03/30/2017  3:37 AM Date of Discharge: 04/02/2017 Attending Physician: Oda Kilts, MD  Discharge Diagnosis:  Principal Problem:   Hip pain Active Problems:   Multiple myeloma ([place])   Pain from bone metastases (HCC)   Pressure injury of skin   Discharge Medications: Allergies as of 04/02/2017      Reactions   Morphine And Related Other (See Comments)   Abdominal Pain      Medication List    STOP taking these medications   amLODipine 5 MG tablet Commonly known as:  NORVASC   hydrALAZINE 50 MG tablet Commonly known as:  APRESOLINE     TAKE these medications   dexamethasone 4 MG tablet Commonly known as:  DECADRON Take 1 tablet (4 mg total) by mouth daily.   furosemide 80 MG tablet Commonly known as:  LASIX Take 1 tablet (80 mg total) by mouth daily.   nitroGLYCERIN 0.4 MG SL tablet Commonly known as:  NITROSTAT Place 1 tablet (0.4 mg total) under the tongue every 5 (five) minutes as needed for chest pain.   oxyCODONE 5 MG immediate release tablet Commonly known as:  Oxy IR/ROXICODONE Take 1 tablet (5 mg total) by mouth every 4 (four) hours as needed for severe pain. What changed:  when to take this   prochlorperazine 10 MG tablet Commonly known as:  COMPAZINE Take 1 tablet (10 mg total) by mouth every 6 (six) hours as needed for nausea or vomiting.   senna-docusate 8.6-50 MG tablet Commonly known as:  Senokot-S Take 1 tablet by mouth 2 (two) times daily.       Disposition and follow-up:   Kayla Brooks was discharged from [facility] in Serious condition.  At the hospital follow up visit please address:  Multiple Myeloma; Hip Pain; Pain from Metastasis:Please continue to provided pain medication PRN. Further adjustments to be managed by home hospice services  Sacral pressure ulcer:Please provide wound  care as indicated.  2.  Labs / imaging needed at time of follow-up: None  3.  Pending labs/ test needing follow-up: None  Follow-up Appointments: Follow-up Information    Schedule an appointment as soon as possible for a visit with Axel Filler, MD.   Specialty:  Internal Medicine Contact information: [address] [place] [address] [phone]        [place] Follow up.   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 Please refer to prior notes from oncology (Dr. Alen Blew). She presented with worsening left hip pain. CT showed known multiple myeloma and no acute abnormality. UA not suggestive of infection. CMP was unremarkable and lipase was normal. It is unclear how the patient takes her pain medication (oxycodone) at home. Hospice and Social Work worked with the family to find the best disposition plan for the patient (inclunding searching for placement in a hospice home.) Patients pain was controlled with Percocet 5-325 mg q3h PRN. Morphine was considered, but patient has a morphine allergy listed. She stated morphine allergy was abdominal discomfort. Family Opted, based on their options, to take the patient to her son's house with  home hospice. Patient was ordered Senokot-S for opioid induced constipation.  Hypertension: Home antihypertensives held and discontinued in the setting of episodes of hypotension and recieving hospice care. She was continued on her Lasix 80 mg daily, which was recently prescribed by her PCP for bilateral lower extremity edema.  Possible sacral pressure ulcer:Reported by family. Wound care evaluated and reported a stage 1 pressure injury at sacrum 2X2cm and a partial  thickness fissure in gluteal fold, .5X.1X.1cm.  Discharge Vitals:   BP (!) 147/83 (BP Location: Right Arm)   Pulse 76   Temp 97.8 F (36.6 C) (Oral)   Resp 18   Ht '5\' 5"'  (1.651 m)   Wt 106 lb 14.8 oz (48.5 kg)   SpO2 100%   BMI 17.79 kg/m   Pertinent Labs, Studies, and Procedures:  CBC Latest Ref Rng & Units 03/30/2017 03/28/2017 02/05/2017  WBC 4.0 - 10.5 K/uL 9.0 9.1 5.3  Hemoglobin 12.0 - 15.0 g/dL 8.8(L) 10.0(L) 7.5(L)  Hematocrit 36.0 - 46.0 % 26.2(L) 29.3(L) 23.6(L)  Platelets 150 - 400 K/uL 160 181 138(L)   BMP Latest Ref Rng & Units 03/30/2017 03/28/2017 02/05/2017  Glucose 65 - 99 mg/dL 96 116(H) 95  BUN 6 - 20 mg/dL 51(H) 47(H) 50(H)  Creatinine 0.44 - 1.00 mg/dL 1.17(H) 1.20(H) 1.29(H)  BUN/Creat Ratio 12 - 28 - - -  Sodium 135 - 145 mmol/L 139 137 141  Potassium 3.5 - 5.1 mmol/L 4.2 5.0 4.7  Chloride 101 - 111 mmol/L 106 106 110  CO2 22 - 32 mmol/L 21(L) 21(L) 22  Calcium 8.9 - 10.3 mg/dL 8.5(L) 8.8(L) 8.1(L)   CT Abdomen Pelvis WO Contrast: IMPRESSION: 1. No acute abnormality seen to explain the patient's symptoms. 2. Diffuse heterogeneity within the visualized osseous structures reflects the patient's known multiple myeloma. 3. Cholelithiasis.  Gallbladder mildly distended. 4. Atelectasis or scarring at the lung bases. 5. Mild cardiomegaly. 6. Scattered aortic atherosclerosis.  Discharge Instructions: Discharge Instructions    Diet - low sodium heart healthy    Complete by:  As directed    Increase activity slowly    Complete by:  As directed       Signed: Valinda Party, DO 04/02/2017, 1:56 PM   Pager: [phone]

## 2017-04-01 NOTE — Consult Note (Signed)
[facility]--Met at bedside with son-Anthony Redditt to introduce him to [facility] services.  He states that there has been a miscommunication. They were already involved with the referral dept. @ [facility] [place] prior to pt's hospitalization.  States they already have an appt for [facility] to meet with them on Sunday to enroll pt into services as she will be moving in to live with him.  States it has never been the goal to send pt to an inpatient Hospice unit at this time.  [facility]ie is not interested in [facility] services at this time.  Gave him my card in case [facility] can assist in the future.  Thank you.  Wynetta Fines, RN

## 2017-04-02 DIAGNOSIS — C7951 Secondary malignant neoplasm of bone: Secondary | ICD-10-CM

## 2017-04-02 DIAGNOSIS — I10 Essential (primary) hypertension: Secondary | ICD-10-CM

## 2017-04-02 MED ORDER — OXYCODONE HCL 5 MG PO TABS: 5.0000 mg | ORAL_TABLET | ORAL | 0 refills | Status: AC | PRN

## 2017-04-02 MED ORDER — LOPERAMIDE HCL 2 MG PO CAPS
2.0000 mg | ORAL_CAPSULE | Freq: Once | ORAL | Status: AC
  Administered 2017-04-02: 2 mg via ORAL
  Filled 2017-04-02: qty 1

## 2017-04-02 MED ORDER — SENNOSIDES-DOCUSATE SODIUM 8.6-50 MG PO TABS: 1.0000 | ORAL_TABLET | Freq: Two times a day (BID) | ORAL | 0 refills | Status: AC

## 2017-04-02 MED ORDER — DEXAMETHASONE 4 MG PO TABS: 4.0000 mg | ORAL_TABLET | Freq: Every day | ORAL | 0 refills | Status: AC

## 2017-04-02 NOTE — Progress Notes (Signed)
Spoke w [name] w [facility]. Patient admission appointment w home hospice scheduled for tomorrow. [name] arranging delivery w [facility] and Brooks of oxygen for later today. Spoke w bedside RN Mickel Baas. She verbalized understanding to check w Brooks, [name] to verify that home oxygen is delivered prior to letting [facility] take patient out. Kayla Brooks, Kayla Brooks   [phone]

## 2017-04-02 NOTE — Progress Notes (Signed)
Spoke with patient's son and the oxygen has not been delivered. Waiting for the oxygen to be delivered so patient can be D/C.

## 2017-04-02 NOTE — Progress Notes (Signed)
   Subjective: Patient was evaluated this morning on rounds.  She was sitting up in the bedside chair and appeared comfortable.  She denied pain or shortness of breath.  Objective:  Vital signs in last 24 hours: Vitals:   04/01/17 0522 04/01/17 1324 04/01/17 2231 04/02/17 0544  BP: (!) 122/59 (!) 141/75 139/85 (!) 147/83  Pulse: 75 63 89 76  Resp: 18 18 16 18  Temp: 99.9 F (37.7 C) 99 F (37.2 C)  97.8 F (36.6 C)  TempSrc: Oral Oral  Oral  SpO2: 97% 100% 100% 100%  Weight:    106 lb 14.8 oz (48.5 kg)  Height:       Physical Exam  Constitutional: No distress.  Cardiovascular: Normal rate, regular rhythm and normal heart sounds.   Pulmonary/Chest: Effort normal. No respiratory distress. She has no wheezes.  Musculoskeletal:  1+ pitting edema in lower extremities bilaterally  Skin: Skin is warm and dry.     Assessment/Plan:  Principal Problem:   Hip pain Active Problems:   Multiple myeloma (HCC)   Pain from bone metastases (HCC)   Pressure injury of skin  End stage multiple myeloma with bone metastases Presented with worsening left hip pain.  Currently pain controlled with oral narcotic.  Plan is for discharge with home hospice - Percocet 5-325 mg q3prn  - Tylenol when necessary - Continue home Compazine 10 mg every 6 hours as needed for nausea - Continue home dexamethasone - Senokot-S - PT/OT - home with hospice  Hypertension: Holding home medications in the setting of episodes of hypotension and hospice care.   Lower extremity edema bilaterally Recently prescribed lasix by PCP for bilateral lower extremity ede -Lasix 80 mg daily    Chronic anemia:Likely related to plasma cell disorder. Hemoglobin stable at 8.8.  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 Cr 1.1 and GFR 48.  Possible sacral pressure ulcer:Reported by family. Wound care reports stage 1 pressure injury at sacrum 2X2cm and partial thickness fissure in gluteal fold,  .5X.1X.1cm.  Diet: Regular  Code status:Patient was lethargic and did notcommunicate her wish. Family at bedside including daughter wishes for her to be DO NOT RESUSCITATE.   Dispo: Anticipated discharge 0-1 days.    Hoffman, Jessica Ratliff, DO 04/02/2017, 11:54 AM Pager: [phone]  

## 2017-04-02 NOTE — Progress Notes (Signed)
Spoke with Education officer, museum, waiting on son to obtain oxygen for home so patient can D/C by [facility]. Will follow up with son.

## 2017-04-02 NOTE — Progress Notes (Signed)
  Date: 04/02/2017  Patient name: Kayla Brooks  Medical record number: [id number]  Date of birth: Aug 04, 1929   I have seen and evaluated this patient and I have discussed the plan of care with the house staff. Please see their note for complete details. I concur with their findings with the following additions/corrections:   Pain control seems improved, continues to do well. Plan to discharge home with hospice today. She will be staying with her son and will have care provided by her family with the support of the home hospice team. We will continue her on dexamethasone 4 mg daily and oxycodone and acetaminophen when necessary. We will also continue furosemide for leg swelling as this provides some relief for her.  Oda Kilts, MD 04/02/2017, 5:00 PM

## 2017-04-03 NOTE — Care Management Note (Signed)
Case Management Note  Patient Details  Name: Kayla Brooks MRN: [id number] Date of Birth: 09/04/1929  Subjective/Objective:     Pt with End stage multiple myeloma with bone metastases.            Action/Plan: Plan is to d/c to home today to son's home with home hospice. CM spoke with son , Anthony, and confirmed oxygen has been delivered to the home ( [address], [address], [place], [place] [address]).  [facility] call @ [phone] by CM and arranged transportation pickup. CM made son Anthony aware. Expected Discharge Date:  04/02/17               Expected Discharge Plan:  Home w Hospice Care  In-House Referral:  Clinical Social Work  Discharge planning Services  CM Consult  Post Acute Care Choice:    Choice offered to:     DME Arranged:    OXYGEN ARRANGED PER [facility] DME Agency:   [facility]  HH Arranged:    HH Agency:  [facility]  Status of Service:  Completed, signed off  If discussed at Long Length of Stay Meetings, dates discussed:    Additional Comments:  Cole, Angela Hudson, RN 04/03/2017, 11:54 AM  

## 2017-04-03 NOTE — Progress Notes (Signed)
Nsg Discharge Note  Admit Date:  03/30/2017 Discharge date: 04/03/2017   Kayla Brooks to be D/C'd Home per MD order.  AVS completed.  Copy for chart, and copy for patient signed, and dated. Patient/caregiver able to verbalize understanding.  Discharge Medication: Allergies as of 04/03/2017      Reactions   Morphine And Related Other (See Comments)   Abdominal Pain      Medication List    STOP taking these medications   amLODipine 5 MG tablet Commonly known as:  NORVASC   hydrALAZINE 50 MG tablet Commonly known as:  APRESOLINE     TAKE these medications   dexamethasone 4 MG tablet Commonly known as:  DECADRON Take 1 tablet (4 mg total) by mouth daily.   furosemide 80 MG tablet Commonly known as:  LASIX Take 1 tablet (80 mg total) by mouth daily.   nitroGLYCERIN 0.4 MG SL tablet Commonly known as:  NITROSTAT Place 1 tablet (0.4 mg total) under the tongue every 5 (five) minutes as needed for chest pain.   oxyCODONE 5 MG immediate release tablet Commonly known as:  Oxy IR/ROXICODONE Take 1 tablet (5 mg total) by mouth every 4 (four) hours as needed for severe pain. What changed:  when to take this   prochlorperazine 10 MG tablet Commonly known as:  COMPAZINE Take 1 tablet (10 mg total) by mouth every 6 (six) hours as needed for nausea or vomiting.   senna-docusate 8.6-50 MG tablet Commonly known as:  Senokot-S Take 1 tablet by mouth 2 (two) times daily.       Discharge Assessment: Vitals:   04/02/17 2124 04/03/17 0635  BP: (!) 160/58 (!) 160/67  Pulse: 64 62  Resp: 18 16  Temp: 98.7 F (37.1 C) 97.8 F (36.6 C)  SpO2: 100% 100%   Skin clean, dry and intact. IV catheter discontinued intact. Site without signs and symptoms of complications - no redness or edema noted at insertion site, patient denies c/o pain - only slight tenderness at site.  Dressing with slight pressure applied.  D/c Instructions-Education: Discharge instructions given to  patient/family with verbalized understanding. D/c education completed with patient/family including follow up instructions, medication list, d/c activities limitations if indicated, with other d/c instructions as indicated by MD - patient able to verbalize understanding, all questions fully answered. Patient instructed to return to ED, call 911, or call MD for any changes in condition.  Patient escorted via [facility] to home.   Eliot Ford, RN 04/03/2017 12:32 PM

## 2017-04-03 NOTE — Progress Notes (Signed)
   Subjective: Patient was seen resting comfortably in her bed this mornig.  She denied pain or shortness of breath. She had no complaints this morning.  Objective:  Vital signs in last 24 hours: Vitals:   04/02/17 0544 04/02/17 1512 04/02/17 2124 04/03/17 0635  BP: (!) 147/83 (!) 118/59 (!) 160/58 (!) 160/67  Pulse: 76 76 64 62  Resp: '18 18 18 16  '$ Temp: 97.8 F (36.6 C) 98.5 F (36.9 C) 98.7 F (37.1 C) 97.8 F (36.6 C)  TempSrc: Oral Oral Oral Oral  SpO2: 100% 100% 100% 100%  Weight: 106 lb 14.8 oz (48.5 kg)     Height:       Physical Exam  Constitutional: No distress.  Cardiovascular: Normal rate, regular rhythm and normal heart sounds.   Pulmonary/Chest: Effort normal. No respiratory distress. She has no wheezes.  Musculoskeletal:  1+ pitting edema in lower extremities bilaterally  Skin: Skin is warm and dry.     Assessment/Plan:  Principal Problem:   Hip pain Active Problems:   Multiple myeloma (HCC)   Pain from bone metastases (HCC)   Pressure injury of skin  End stage multiple myeloma with bone metastases Presented with worsening left hip pain.  Currently pain controlled with oral narcotic.  Plan is for discharge with home hospice. - Percocet 5-325 mg q3prn  - Tylenol when necessary - Continue home Compazine 10 mg every 6 hours as needed for nausea - Continue home dexamethasone - Senokot-S - PT/OT - Home with hospice  Hypertension: Holding home medications in the setting of episodes of hypotension and hospice care.   Lower extremity edema bilaterally Recently prescribed lasix by PCP for bilateral lower extremity edema - Lasix 80 mg daily    Chronic anemia:Likely related to plasma cell disorder. Hemoglobin stable at 8.8.  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 Cr 1.1 and GFR 48.  Possible sacral pressure ulcer:Reported by family. Wound care reports stage 1 pressure injury at sacrum 2X2cm and partial thickness fissure in gluteal fold,  .5X.1X.1cm.  Diet: Regular  Code status:Patient was lethargic and did notcommunicate her wish. Family at bedside including daughter wishes for her to be DO NOT RESUSCITATE.   Dispo: Anticipated discharge 0-1 days.    Neva Seat, MD 04/03/2017, 8:12 AM Pager: [phone]

## 2017-04-03 NOTE — Care Management Note (Signed)
Case Management Note  Patient Details  Name: SHAMEL GALYEAN MRN: [id number] Date of Birth: [birth year]  Subjective/Objective:  Received call from [facility] with [place] to update CM on reason pt was not transferred to home on 04/02/2017. [facility] was unable to deliver O2 to home. They will however deliver O2 between 1000 and 1400 today. When this happens CM will arrange [facility] transport to home for pt. CM will update RN and MD.                   Action/Plan:CM will continue to follow for Discharge/Disposition needs.   Expected Discharge Date:  04/02/17               Expected Discharge Plan:  [place]  In-House Referral:  Clinical Social Work  Discharge planning Services  CM Consult  Post Acute Care Choice:    Choice offered to:     DME Arranged:    DME Agency:     HH Arranged:    [place] Agency:     Status of Service:  Completed, signed off  If discussed at [employer], dates discussed:    Additional Comments:  Delrae Sawyers, RN 04/03/2017, 8:58 AM

## 2017-04-03 NOTE — Progress Notes (Signed)
[facility]: RN note  Spoke with son [name]  and he stated he had not heard from [facility] as of last night and called this morning to follow up on delivery. [name] states the oxygen will be delivered today between 10-2pm. Follow call made to [facility] and confirmed delivery for today, this delivery will be the first one for the driver and should be delivered by 11:00 am. Son will call the unit once oxygen has been delivered so the staff can call for [facility] to transport patient home. Case manager made aware.   Please call for any hospice related questions.  Thank you,  Farrel Gordon, RN, [facility] Liaison [phone]

## 2017-04-05 ENCOUNTER — Ambulatory Visit: Payer: Self-pay | Admitting: Oncology

## 2017-04-08 ENCOUNTER — Telehealth: Payer: Self-pay

## 2017-04-08 NOTE — Telephone Encounter (Signed)
Faxed [facility] form @ [phone] on 04/07/2017.

## 2017-04-25 ENCOUNTER — Ambulatory Visit: Payer: Self-pay | Admitting: Student in an Organized Health Care Education/Training Program

## 2017-04-25 ENCOUNTER — Encounter: Payer: Self-pay | Admitting: Student in an Organized Health Care Education/Training Program

## 2017-05-21 DEATH — deceased
